# Patient Record
Sex: Female | Born: 1955 | Race: White | Hispanic: No | Marital: Married | Smoking: Never smoker
Health system: Southern US, Community
[De-identification: ages and names within clinical notes are randomized; demographics above are authoritative.]

## PROBLEM LIST (undated history)

## (undated) ENCOUNTER — Emergency Department: Admission: EM | Disposition: A | Discharge status: Home / Self Care | Payer: Self-pay | Source: Home / Self Care

## (undated) DIAGNOSIS — J45909 Unspecified asthma, uncomplicated: Secondary | ICD-10-CM

## (undated) DIAGNOSIS — K5090 Crohn's disease, unspecified, without complications: Secondary | ICD-10-CM

## (undated) DIAGNOSIS — G4700 Insomnia, unspecified: Secondary | ICD-10-CM

## (undated) DIAGNOSIS — R0602 Shortness of breath: Secondary | ICD-10-CM

## (undated) DIAGNOSIS — I341 Nonrheumatic mitral (valve) prolapse: Secondary | ICD-10-CM

## (undated) DIAGNOSIS — D649 Anemia, unspecified: Secondary | ICD-10-CM

## (undated) DIAGNOSIS — T8859XA Other complications of anesthesia, initial encounter: Secondary | ICD-10-CM

## (undated) DIAGNOSIS — R011 Cardiac murmur, unspecified: Secondary | ICD-10-CM

## (undated) DIAGNOSIS — Z5189 Encounter for other specified aftercare: Secondary | ICD-10-CM

## (undated) DIAGNOSIS — M1990 Unspecified osteoarthritis, unspecified site: Secondary | ICD-10-CM

## (undated) DIAGNOSIS — I499 Cardiac arrhythmia, unspecified: Secondary | ICD-10-CM

## (undated) DIAGNOSIS — Q625 Duplication of ureter: Secondary | ICD-10-CM

## (undated) DIAGNOSIS — F419 Anxiety disorder, unspecified: Secondary | ICD-10-CM

## (undated) DIAGNOSIS — H539 Unspecified visual disturbance: Secondary | ICD-10-CM

## (undated) DIAGNOSIS — N12 Tubulo-interstitial nephritis, not specified as acute or chronic: Secondary | ICD-10-CM

## (undated) DIAGNOSIS — J189 Pneumonia, unspecified organism: Secondary | ICD-10-CM

## (undated) DIAGNOSIS — E041 Nontoxic single thyroid nodule: Secondary | ICD-10-CM

## (undated) DIAGNOSIS — I10 Essential (primary) hypertension: Secondary | ICD-10-CM

## (undated) DIAGNOSIS — N200 Calculus of kidney: Secondary | ICD-10-CM

## (undated) DIAGNOSIS — R569 Unspecified convulsions: Secondary | ICD-10-CM

## (undated) DIAGNOSIS — Z9889 Other specified postprocedural states: Secondary | ICD-10-CM

## (undated) DIAGNOSIS — Z96659 Presence of unspecified artificial knee joint: Secondary | ICD-10-CM

## (undated) DIAGNOSIS — F32A Depression, unspecified: Secondary | ICD-10-CM

## (undated) DIAGNOSIS — J449 Chronic obstructive pulmonary disease, unspecified: Secondary | ICD-10-CM

## (undated) HISTORY — DX: Crohn's disease, unspecified, without complications: K50.90

## (undated) HISTORY — DX: Other specified postprocedural states: Z98.890

## (undated) HISTORY — DX: Unspecified osteoarthritis, unspecified site: M19.90

## (undated) HISTORY — DX: Presence of unspecified artificial knee joint: Z96.659

## (undated) HISTORY — DX: Chronic obstructive pulmonary disease, unspecified: J44.9

## (undated) HISTORY — DX: Duplication of ureter: Q62.5

## (undated) HISTORY — PX: APPENDECTOMY (OPEN): SHX54

## (undated) HISTORY — PX: COLONOSCOPY W/ BIOPSIES: SHX1374

## (undated) HISTORY — DX: Tubulo-interstitial nephritis, not specified as acute or chronic: N12

## (undated) HISTORY — PX: KNEE ARTHROPLASTY: SHX992

## (undated) HISTORY — PX: GYNECOLOGIC CRYOSURGERY: SHX857

## (undated) HISTORY — DX: Essential (primary) hypertension: I10

## (undated) HISTORY — DX: Nontoxic single thyroid nodule: E04.1

## (undated) NOTE — Progress Notes (Signed)
Formatting of this note might be different from the original.  This patient's chart has been reviewed by a [facility] Specialist.    Attempted to contact patient in order to discuss appointments and health maintenance screenings due for the upcoming year.     Left message with [facility] Specialist's contact information. and [employer] message with health maintenance recommendations and [facility] Specialist's contact information..    Additional Comments:   Electronically signed by Olivia Mackie at 02/14/2023  8:40 AM EDT

---

## 1995-05-30 ENCOUNTER — Ambulatory Visit: Admission: RE | Admit: 1995-05-30 | Payer: Self-pay | Source: Ambulatory Visit | Admitting: Gastroenterology

## 1996-02-22 ENCOUNTER — Emergency Department: Admission: RE | Admit: 1996-02-22 | Payer: Self-pay | Source: Emergency Department | Admitting: Emergency Medicine

## 1996-02-29 ENCOUNTER — Emergency Department: Admit: 1996-02-29 | Payer: Self-pay | Source: Emergency Department | Admitting: Emergency Medicine

## 1996-10-29 ENCOUNTER — Ambulatory Visit: Admission: RE | Admit: 1996-10-29 | Payer: Self-pay | Source: Ambulatory Visit | Admitting: Gastroenterology

## 1997-04-15 ENCOUNTER — Ambulatory Visit
Admit: 1997-04-15 | Disposition: A | Discharge status: Home / Self Care | Payer: Self-pay | Source: Ambulatory Visit | Admitting: Gastroenterology

## 1997-10-11 ENCOUNTER — Emergency Department: Admit: 1997-10-11 | Payer: Self-pay | Source: Emergency Department | Admitting: Emergency Medicine

## 1998-01-26 ENCOUNTER — Ambulatory Visit
Admit: 1998-01-26 | Disposition: A | Discharge status: Home / Self Care | Payer: Self-pay | Source: Ambulatory Visit | Admitting: Urology

## 1998-01-27 ENCOUNTER — Inpatient Hospital Stay
Admission: AD | Admit: 1998-01-27 | Disposition: A | Discharge status: Home / Self Care | Payer: Self-pay | Source: Ambulatory Visit | Admitting: Urology

## 1998-08-10 ENCOUNTER — Ambulatory Visit: Admission: RE | Admit: 1998-08-10 | Payer: Self-pay | Source: Ambulatory Visit | Admitting: Gastroenterology

## 1999-09-06 ENCOUNTER — Emergency Department: Admit: 1999-09-06 | Payer: Self-pay | Source: Emergency Department | Admitting: Emergency Medicine

## 2001-01-07 ENCOUNTER — Emergency Department: Admit: 2001-01-07 | Payer: Self-pay | Source: Emergency Department

## 2002-04-25 ENCOUNTER — Emergency Department: Admit: 2002-04-25 | Payer: Self-pay | Source: Emergency Department

## 2002-06-11 ENCOUNTER — Emergency Department: Admit: 2002-06-11 | Payer: Self-pay | Source: Emergency Department | Admitting: Pediatric Emergency Medicine

## 2002-06-12 ENCOUNTER — Ambulatory Visit
Admit: 2002-06-12 | Disposition: A | Discharge status: Home / Self Care | Payer: Self-pay | Source: Ambulatory Visit | Admitting: Gastroenterology

## 2002-12-11 ENCOUNTER — Ambulatory Visit: Admission: RE | Admit: 2002-12-11 | Payer: Self-pay | Source: Ambulatory Visit | Admitting: Gastroenterology

## 2003-09-13 ENCOUNTER — Inpatient Hospital Stay
Admission: EM | Admit: 2003-09-13 | Disposition: A | Discharge status: Home / Self Care | Payer: Self-pay | Source: Emergency Department | Admitting: Gastroenterology

## 2003-11-08 ENCOUNTER — Inpatient Hospital Stay
Admission: EM | Admit: 2003-11-08 | Disposition: A | Discharge status: Home / Self Care | Payer: Self-pay | Source: Emergency Department | Admitting: Internal Medicine

## 2004-06-23 ENCOUNTER — Ambulatory Visit
Admit: 2004-06-23 | Disposition: A | Discharge status: Home / Self Care | Payer: Self-pay | Source: Ambulatory Visit | Admitting: Gastroenterology

## 2004-10-04 ENCOUNTER — Ambulatory Visit: Admission: RE | Admit: 2004-10-04 | Payer: Self-pay | Source: Ambulatory Visit | Admitting: Gastroenterology

## 2005-01-09 ENCOUNTER — Ambulatory Visit
Admission: RE | Admit: 2005-01-09 | Disposition: A | Discharge status: Home / Self Care | Payer: Self-pay | Source: Ambulatory Visit | Admitting: Surgery

## 2005-10-11 ENCOUNTER — Emergency Department: Admit: 2005-10-11 | Payer: Self-pay | Source: Emergency Department | Admitting: Emergency Medicine

## 2005-10-11 LAB — BASIC METABOLIC PANEL
BUN: 7 mg/dL — ABNORMAL LOW (ref 8–20)
CO2: 25 mEq/L (ref 21–30)
Calcium: 9.9 mg/dL (ref 8.6–10.2)
Chloride: 107 mEq/L (ref 98–107)
Creatinine: 0.6 mg/dL (ref 0.6–1.5)
Glucose: 93 mg/dL (ref 70–100)
Potassium: 3.8 mEq/L (ref 3.6–5.0)
Sodium: 142 mEq/L (ref 136–146)

## 2005-10-11 LAB — HEPATIC FUNCTION PANEL
ALT: 23 U/L (ref 3–36)
AST (SGOT): 34 U/L (ref 10–41)
Albumin/Globulin Ratio: 1.1 (ref 1.1–1.8)
Albumin: 4.1 g/dL (ref 3.4–4.9)
Alkaline Phosphatase: 83 U/L (ref 43–112)
Bilirubin Direct: 0.3 mg/dL (ref 0.0–0.3)
Bilirubin Indirect: 0 mg/dL — AB (ref 0.1–0.9)
Bilirubin, Total: 0.4 mg/dL (ref 0.1–1.0)
Globulin: 3.6 g/dL (ref 2.0–3.7)
Protein, Total: 7.7 g/dL (ref 6.0–8.0)

## 2005-10-11 LAB — URINALYSIS WITH MICROSCOPIC
Bilirubin, UA: NEGATIVE
Blood, UA: NEGATIVE
Glucose, UA: NEGATIVE
Ketones UA: NEGATIVE
Leukocyte Esterase, UA: NEGATIVE
Nitrite, UA: NEGATIVE
Protein, UR: NEGATIVE
Specific Gravity UA POCT: 1.013 (ref 1.001–1.035)
Urine pH: 8.5 (ref 5.0–8.0)
Urobilinogen, UA: 0.2

## 2005-10-11 LAB — CBC WITH AUTO DIFFERENTIAL CERNER
Basophils Absolute: 0 /mm3 (ref 0.0–0.2)
Basophils: 1 % (ref 0–2)
Eosinophils Absolute: 0.1 /mm3 (ref 0.0–0.7)
Eosinophils: 2 % (ref 0–5)
Granulocytes Absolute: 5.3 /mm3 (ref 1.8–8.1)
Hematocrit: 42.4 % (ref 37.0–47.0)
Hgb: 14.6 G/DL (ref 12.0–16.0)
Lymphocytes Absolute: 3.3 /mm3 (ref 0.5–4.4)
Lymphocytes: 35 % (ref 15–41)
MCH: 30.5 PG (ref 28.0–32.0)
MCHC: 34.4 G/DL (ref 32.0–36.0)
MCV: 88.7 FL (ref 80.0–100.0)
MPV: 7.2 FL — ABNORMAL LOW (ref 7.4–10.4)
Monocytes Absolute: 0.6 /mm3 (ref 0.0–1.2)
Monocytes: 6 % (ref 0–11)
Neutrophils %: 57 % (ref 52–75)
Platelets: 362 /mm3 (ref 140–400)
RBC: 4.77 /mm3 (ref 4.20–5.40)
RDW: 14.1 % (ref 11.5–15.0)
WBC: 9.3 /mm3 (ref 3.5–10.8)

## 2005-10-11 LAB — GFR

## 2005-10-11 LAB — AMYLASE: Amylase: 44 U/L (ref 5–90)

## 2005-10-11 LAB — LIPASE: Lipase: 88 U/L (ref 32–219)

## 2005-10-26 ENCOUNTER — Ambulatory Visit: Admission: RE | Admit: 2005-10-26 | Payer: Self-pay | Source: Ambulatory Visit | Admitting: Gastroenterology

## 2008-03-04 ENCOUNTER — Emergency Department: Admit: 2008-03-04 | Payer: Self-pay | Source: Emergency Department | Admitting: Emergency Medical Services

## 2008-03-04 LAB — COMPREHENSIVE METABOLIC PANEL
ALT: 24 U/L (ref 3–36)
AST (SGOT): 30 U/L (ref 10–41)
Albumin/Globulin Ratio: 1 — ABNORMAL LOW (ref 1.1–1.8)
Albumin: 3.9 g/dL (ref 3.4–4.9)
Alkaline Phosphatase: 71 U/L (ref 43–112)
BUN: 6 mg/dL — ABNORMAL LOW (ref 8–20)
Bilirubin, Total: 0.4 mg/dL (ref 0.1–1.0)
CO2: 24 mEq/L (ref 21–30)
Calcium: 9.6 mg/dL (ref 8.6–10.2)
Chloride: 109 mEq/L — ABNORMAL HIGH (ref 98–107)
Creatinine: 0.7 mg/dL (ref 0.6–1.5)
Globulin: 3.8 g/dL — ABNORMAL HIGH (ref 2.0–3.7)
Glucose: 107 mg/dL — ABNORMAL HIGH (ref 70–100)
Potassium: 3.9 mEq/L (ref 3.6–5.0)
Protein, Total: 7.7 g/dL (ref 6.0–8.0)
Sodium: 142 mEq/L (ref 136–146)

## 2008-03-04 LAB — URINALYSIS WITH MICROSCOPIC
Bilirubin, UA: NEGATIVE
Glucose, UA: NEGATIVE
Ketones UA: NEGATIVE
Nitrite, UA: NEGATIVE
Protein, UR: NEGATIVE
RBC, UA: 26 /HPF — ABNORMAL HIGH (ref 0–3)
Specific Gravity UA POCT: 1.006 (ref 1.001–1.035)
Urine pH: 7.5 (ref 5.0–8.0)
Urobilinogen, UA: NORMAL mg/dL
WBC, UA: 8 /HPF — ABNORMAL HIGH (ref 0–5)

## 2008-03-04 LAB — GFR

## 2008-03-04 LAB — CBC AND DIFFERENTIAL
Basophils Absolute: 0 /mm3 (ref 0.0–0.2)
Basophils: 0 % (ref 0–2)
Eosinophils Absolute: 0.1 /mm3 (ref 0.0–0.7)
Eosinophils: 2 % (ref 0–5)
Granulocytes Absolute: 5.2 /mm3 (ref 1.8–8.1)
Hematocrit: 41.2 % (ref 37.0–47.0)
Hgb: 14 G/DL (ref 12.0–16.0)
Immature Granulocytes Absolute: 0
Immature Granulocytes: 0 %
Lymphocytes Absolute: 1.8 /mm3 (ref 0.5–4.4)
Lymphocytes: 24 % (ref 15–41)
MCH: 29.5 PG (ref 28.0–32.0)
MCHC: 34 G/DL (ref 32.0–36.0)
MCV: 86.7 FL (ref 80.0–100.0)
MPV: 8.8 FL — ABNORMAL LOW (ref 9.4–12.3)
Monocytes Absolute: 0.3 /mm3 (ref 0.0–1.2)
Monocytes: 4 % (ref 0–11)
Neutrophils %: 69 % (ref 52–75)
Platelets: 247 /mm3 (ref 140–400)
RBC: 4.75 /mm3 (ref 4.20–5.40)
RDW: 13.4 % (ref 11.5–15.0)
WBC: 7.54 /mm3 (ref 3.50–10.80)

## 2008-03-04 LAB — LIPASE: Lipase: 78 U/L (ref 32–219)

## 2008-06-03 ENCOUNTER — Emergency Department: Admit: 2008-06-03 | Payer: Self-pay | Source: Emergency Department | Admitting: Emergency Medical Services

## 2008-06-03 LAB — COMPREHENSIVE METABOLIC PANEL
ALT: 11 U/L (ref 3–36)
AST (SGOT): 29 U/L (ref 10–41)
Albumin/Globulin Ratio: 1.1 (ref 1.1–1.8)
Albumin: 3.9 g/dL (ref 3.4–4.9)
Alkaline Phosphatase: 73 U/L (ref 43–112)
BUN: 9 mg/dL (ref 8–20)
Bilirubin, Total: 0.3 mg/dL (ref 0.1–1.0)
CO2: 26 mEq/L (ref 21–30)
Calcium: 9.7 mg/dL (ref 8.6–10.2)
Chloride: 109 mEq/L — ABNORMAL HIGH (ref 98–107)
Creatinine: 0.7 mg/dL (ref 0.6–1.5)
Globulin: 3.5 g/dL (ref 2.0–3.7)
Glucose: 82 mg/dL (ref 70–100)
Potassium: 4 mEq/L (ref 3.6–5.0)
Protein, Total: 7.4 g/dL (ref 6.0–8.0)
Sodium: 141 mEq/L (ref 136–146)

## 2008-06-03 LAB — CBC AND DIFFERENTIAL
Basophils Absolute: 0 /mm3 (ref 0.0–0.2)
Basophils: 0 % (ref 0–2)
Eosinophils Absolute: 0.2 /mm3 (ref 0.0–0.7)
Eosinophils: 2 % (ref 0–5)
Granulocytes Absolute: 4.8 /mm3 (ref 1.8–8.1)
Hematocrit: 42.3 % (ref 37.0–47.0)
Hgb: 14.2 G/DL (ref 12.0–16.0)
Immature Granulocytes Absolute: 0
Immature Granulocytes: 0 %
Lymphocytes Absolute: 2.6 /mm3 (ref 0.5–4.4)
Lymphocytes: 33 % (ref 15–41)
MCH: 29 PG (ref 28.0–32.0)
MCHC: 33.6 G/DL (ref 32.0–36.0)
MCV: 86.5 FL (ref 80.0–100.0)
MPV: 9.5 FL (ref 9.4–12.3)
Monocytes Absolute: 0.4 /mm3 (ref 0.0–1.2)
Monocytes: 5 % (ref 0–11)
Neutrophils %: 60 % (ref 52–75)
Platelets: 295 /mm3 (ref 140–400)
RBC: 4.89 /mm3 (ref 4.20–5.40)
RDW: 13.1 % (ref 11.5–15.0)
WBC: 8.08 /mm3 (ref 3.50–10.80)

## 2008-06-03 LAB — LIPASE: Lipase: 112 U/L (ref 32–219)

## 2008-06-03 LAB — GFR

## 2008-06-30 ENCOUNTER — Ambulatory Visit
Admit: 2008-06-30 | Disposition: A | Discharge status: Home / Self Care | Payer: Self-pay | Source: Ambulatory Visit | Admitting: Urology

## 2008-06-30 LAB — BASIC METABOLIC PANEL
BUN: 7 MG/DL (ref 7–21)
CO2: 31 MEQ/L (ref 22–31)
Calcium: 9.9 MG/DL (ref 8.6–10.2)
Chloride: 105 MEQ/L (ref 98–107)
Creatinine: 0.7 MG/DL (ref 0.5–1.4)
Glucose: 85 MG/DL (ref 65–110)
Potassium: 4.2 MEQ/L (ref 3.6–5.0)
Sodium: 140 MEQ/L (ref 136–143)

## 2008-06-30 LAB — GFR

## 2008-06-30 LAB — URINALYSIS
Bilirubin, UA: NEGATIVE
Glucose, UA: NEGATIVE
Ketones UA: NEGATIVE
Nitrite, UA: NEGATIVE
Specific Gravity UA POCT: <=1.005 (ref <=1.030)
Urine pH: 6 (ref 5.0–8.0)
Urobilinogen, UA: 0.2

## 2008-06-30 LAB — CBC
Hematocrit: 43.1 % (ref 37.0–47.0)
Hgb: 14.2 G/DL (ref 12.0–16.0)
MCH: 28.9 PG (ref 28.0–32.0)
MCHC: 32.9 G/DL (ref 32.0–36.0)
MCV: 87.8 FL (ref 80.0–100.0)
MPV: 9.1 FL — ABNORMAL LOW (ref 9.4–12.3)
Platelets: 291 /mm3 (ref 140–400)
RBC: 4.91 /mm3 (ref 4.20–5.40)
RDW: 13.2 % (ref 11.5–15.0)
WBC: 7.25 /mm3 (ref 3.50–10.80)

## 2008-06-30 LAB — URINE MICROSCOPIC

## 2008-06-30 LAB — PT/INR
PT INR: 1.1 {INR} (ref 0.9–1.1)
PT: 12.6 s (ref 10.8–13.3)

## 2008-06-30 LAB — APTT: PTT: 24 s (ref 21–32)

## 2008-07-03 ENCOUNTER — Ambulatory Visit: Admission: RE | Admit: 2008-07-03 | Payer: Self-pay | Source: Ambulatory Visit | Admitting: Urology

## 2008-08-18 ENCOUNTER — Ambulatory Visit
Admit: 2008-08-18 | Disposition: A | Discharge status: Home / Self Care | Payer: Self-pay | Source: Ambulatory Visit | Admitting: Urology

## 2008-08-18 LAB — URINE MICROSCOPIC

## 2008-08-18 LAB — URINALYSIS: Specific Gravity UA POCT: 1.015 (ref <=1.030)

## 2008-08-21 ENCOUNTER — Ambulatory Visit: Admission: RE | Admit: 2008-08-21 | Payer: Self-pay | Source: Ambulatory Visit | Admitting: Urology

## 2011-02-12 ENCOUNTER — Emergency Department
Admit: 2011-02-12 | Disposition: A | Discharge status: Home / Self Care | Payer: Self-pay | Source: Emergency Department | Admitting: Emergency Medicine

## 2011-02-12 LAB — CBC AND DIFFERENTIAL
Basophils Absolute Automated: 0.04 10*3/uL (ref 0.00–0.20)
Basophils Automated: 0 % (ref 0–2)
Eosinophils Absolute Automated: 0.24 10*3/uL (ref 0.00–0.70)
Eosinophils Automated: 3 % (ref 0–5)
Hematocrit: 41.5 % (ref 37.0–47.0)
Hgb: 14.1 g/dL (ref 12.0–16.0)
Immature Granulocytes Absolute: 0.01 10*3/uL
Immature Granulocytes: 0 % (ref 0–1)
Lymphocytes Absolute Automated: 3.58 10*3/uL (ref 0.50–4.40)
Lymphocytes Automated: 41 % (ref 15–41)
MCH: 29.5 pg (ref 28.0–32.0)
MCHC: 34 g/dL (ref 32.0–36.0)
MCV: 86.8 fL (ref 80.0–100.0)
MPV: 9 fL — ABNORMAL LOW (ref 9.4–12.3)
Monocytes Absolute Automated: 0.58 10*3/uL (ref 0.00–1.20)
Monocytes: 7 % (ref 0–11)
Neutrophils Absolute: 4.29 10*3/uL (ref 1.80–8.10)
Neutrophils: 49 % — ABNORMAL LOW (ref 52–75)
Nucleated RBC: 0 /100 WBC
Platelets: 283 10*3/uL (ref 140–400)
RBC: 4.78 10*6/uL (ref 4.20–5.40)
RDW: 13 % (ref 12–15)
WBC: 8.74 10*3/uL (ref 3.50–10.80)

## 2011-02-12 LAB — COMPREHENSIVE METABOLIC PANEL
ALT: 24 U/L (ref 3–36)
AST (SGOT): 47 U/L — ABNORMAL HIGH (ref 10–41)
Albumin/Globulin Ratio: 1 — ABNORMAL LOW (ref 1.1–1.8)
Albumin: 4 g/dL (ref 3.4–4.9)
Alkaline Phosphatase: 65 U/L (ref 43–112)
BUN: 9 mg/dL (ref 8–20)
Bilirubin, Total: 0.8 mg/dL (ref 0.1–1.0)
CO2: 19 mEq/L — ABNORMAL LOW (ref 21–30)
Calcium: 9.6 mg/dL (ref 8.6–10.2)
Chloride: 110 mEq/L — ABNORMAL HIGH (ref 98–107)
Creatinine: 0.6 mg/dL (ref 0.6–1.5)
Globulin: 3.9 g/dL — ABNORMAL HIGH (ref 2.0–3.7)
Glucose: 92 mg/dL (ref 70–100)
Potassium: 4.5 mEq/L (ref 3.6–5.0)
Protein, Total: 7.9 g/dL (ref 6.0–8.0)
Sodium: 142 mEq/L (ref 136–146)

## 2011-02-12 LAB — URINALYSIS, REFLEX TO MICROSCOPIC EXAM IF INDICATED
Bilirubin, UA: NEGATIVE
Blood, UA: NEGATIVE
Glucose, UA: NEGATIVE
Leukocyte Esterase, UA: NEGATIVE
Nitrite, UA: NEGATIVE
Protein, UR: NEGATIVE
Specific Gravity UA POCT: 1.005 (ref 1.001–1.035)
Urine pH: 7 (ref 5.0–8.0)
Urobilinogen, UA: NORMAL mg/dL

## 2011-02-12 LAB — LIPASE: Lipase: 159 U/L (ref 32–219)

## 2011-02-12 LAB — GFR: EGFR: >60

## 2011-02-13 ENCOUNTER — Emergency Department
Admit: 2011-02-13 | Disposition: A | Discharge status: Home / Self Care | Payer: Self-pay | Source: Emergency Department | Admitting: Emergency Medicine

## 2011-02-13 LAB — URINALYSIS POC
POCT Urine Glucose: NEGATIVE mg/dL
POCT Urine Nitrites: NEGATIVE mL
POCT Urine Urobilibogen: 0.2 mg/dL (ref 0.2–2.0)
POCT Urine pH: 5.5 (ref 5.0–8.0)
Protein, UR POCT: NEGATIVE mg/dL

## 2011-02-13 LAB — CBC AND DIFFERENTIAL
Basophils Absolute Automated: 0.05 10*3/uL (ref 0.00–0.20)
Basophils Automated: 1 % (ref 0–2)
Eosinophils Absolute Automated: 0.23 10*3/uL (ref 0.00–0.70)
Eosinophils Automated: 3 % (ref 0–5)
Hematocrit: 43.4 % (ref 37.0–47.0)
Hgb: 14.6 g/dL (ref 12.0–16.0)
Immature Granulocytes Absolute: 0.03 10*3/uL
Immature Granulocytes: 0 % (ref 0–1)
Lymphocytes Absolute Automated: 2.93 10*3/uL (ref 0.50–4.40)
Lymphocytes Automated: 34 % (ref 15–41)
MCH: 29.3 pg (ref 28.0–32.0)
MCHC: 33.6 g/dL (ref 32.0–36.0)
MCV: 87.1 fL (ref 80.0–100.0)
MPV: 10.3 fL (ref 9.4–12.3)
Monocytes Absolute Automated: 0.59 10*3/uL (ref 0.00–1.20)
Monocytes: 7 % (ref 0–11)
Neutrophils Absolute: 4.84 10*3/uL (ref 1.80–8.10)
Neutrophils: 56 % (ref 52–75)
Nucleated RBC: 0 /100 WBC
Platelets: 289 10*3/uL (ref 140–400)
RBC: 4.98 10*6/uL (ref 4.20–5.40)
RDW: 14 % (ref 12–15)
WBC: 8.67 10*3/uL (ref 3.50–10.80)

## 2011-02-13 LAB — COMPREHENSIVE METABOLIC PANEL
ALT: 13 U/L (ref 3–36)
AST (SGOT): 45 U/L — ABNORMAL HIGH (ref 10–41)
Albumin/Globulin Ratio: 1 — ABNORMAL LOW (ref 1.1–1.8)
Albumin: 4 g/dL (ref 3.4–4.9)
Alkaline Phosphatase: 48 U/L (ref 43–112)
BUN: 6 mg/dL — ABNORMAL LOW (ref 8–20)
Bilirubin, Total: 0.9 mg/dL (ref 0.1–1.0)
CO2: 23 mEq/L (ref 21–30)
Calcium: 9.2 mg/dL (ref 8.6–10.2)
Chloride: 106 mEq/L (ref 98–107)
Creatinine: 0.6 mg/dL (ref 0.6–1.5)
Globulin: 4.2 g/dL — ABNORMAL HIGH (ref 2.0–3.7)
Glucose: 143 mg/dL — ABNORMAL HIGH (ref 70–100)
Potassium: 4 mEq/L (ref 3.6–5.0)
Protein, Total: 8.2 g/dL — ABNORMAL HIGH (ref 6.0–8.0)
Sodium: 141 mEq/L (ref 136–146)

## 2011-02-13 LAB — AMYLASE: Amylase: 113 U/L — ABNORMAL HIGH (ref 0–90)

## 2011-02-13 LAB — URINE MICROSCOPIC

## 2011-02-13 LAB — GFR: EGFR: >60

## 2011-02-13 LAB — LIPASE: Lipase: 177 U/L (ref 32–219)

## 2011-04-14 NOTE — Op Note (Signed)
Account Number: [id number]      Document ID: [id number]      Admit Date: 08/21/2008      Procedure Date: 08/21/2008            Patient Location: [facility]      Patient Type: A            SURGEON: Iven Finn MD      ASSISTANT:                  PREOPERATIVE DIAGNOSES:      Left UPJ stone fragments and left lower pole kidney stone, following      previous ESWL.            POSTOPERATIVE DIAGNOSES:      Left UPJ stone fragments and left lower pole kidney stone, following      previous ESWL.            TITLE OF PROCEDURE:      1.  Second stage of a staged ESWL procedure on a very large left kidney      stone that started out about 1.7 cm in size.      2.  Cystoscopy and stent removal.            ESTIMATED BLOOD LOSS:      Minimal.            INTRAVENOUS FLUIDS:      See anesthesia sheet.            DRAINS:      None.            INDICATIONS:      The patient is a 55 year old female presented to me approximately 8 weeks      ago with a left 1.7-cm kidney stone.  After discussing risks and benefits      of the treatment options, she has elected for a staged ESWL.  The first      stage of the shockwave took place approximately 1 month ago.  She was      followed up in the office several days ago and showed some stone fragments      at the UPJ, about 1.4 cm in length and also about a 6-mm stone in the left      lower pole.  After discussing risks and benefits of treatment options, she      has elected for left ESWL.            DESCRIPTION OF PROCEDURE:      She received preoperative Ancef and was brought to the operative suite.      The stone was localized with fluoroscopy at the UPJ.  A total of 2200      shocks were given at the UPJ stone with a maximal power level of 6.  Six      was only used for the last 200 shocks and it was a UPJ stone as well as      some stone going down the proximal ureter.            At 2200 shocks, attention was then focused to the left lower pole stone.  A      total of 800 shocks were given to  this stone with power level between 1 and      4, and there was excellent fragmentation noted of both stone areas.            At the end of  the shocking, the patient was then positioned in a frog-leg      position.  She was prepped.  Cystoscopy was performed which showed no      evidence of any significant bladder pathology.  The stent was grabbed with      a grasper and removed.  The patient's bladder was drained, and she was then      awakened and brought to the recovery room in good condition.                        Electronic Signing Provider      _______________________________     Date/Time Signed: _____________      Iven Finn MD ([id number])            D:  08/21/2008 10:07 AM by Dr. Harlin Rain L. Walker Shadow, MD ([id number])      T:  08/21/2008 11:29 AM by [id number]          [name]: [id number]) (Doc ID: [id number])                  [id number] Donnell Beauchamp MD

## 2011-04-14 NOTE — Op Note (Signed)
Account Number: [id number]      Document ID: [id number]      Admit Date: 07/03/2008      Procedure Date: 07/03/2008            Patient Location: [facility]      Patient Type: A            SURGEON: Iven Finn MD      ASSISTANT:                  PREOPERATIVE DIAGNOSIS:      Left 2 cm ureteropelvic junction stone.            POSTOPERATIVE DIAGNOSIS:      Left 2 cm ureteropelvic junction stone.            TITLE OF PROCEDURE:      1.  Left extracorporeal shockwave lithotripsy.      2.  Cystoscopy and left ureteral stent placement.            DRAINS:      A 4.8 x 24-cm left double-J stent.            SPECIMENS:      None.            ANESTHESIA:      General with LMA.            INDICATIONS:      The patient is a 55 year old female with intermittent left flank pain, a      history of stones.  She is noted to have very light 2 cm left UPJ stone.      After discussing risks and benefits of procedure, she has elected for left      ESWL.            DESCRIPTION OF PROCEDURE:      She received preoperative Ancef, was brought to the main operating room,      underwent general anesthesia.  On a preoperative KUB, we could faintly see      the outline of the left kidney stone.  However, on fluoroscopy, it was      difficult to see.  Therefore, we used ultrasound to localize the stone.  It      was clearly localized with ultrasound.  A total of 2500 shocks were given      in an ungated format with a power level of 1 to 5.  It was difficult to      ascertain how much fragmentation of the stone was done.            Next, the patient was placed in lithotomy position.  Cystoscopy was      performed.  She was noted to have a grade II to III cystocele.  The left      ureteral orifice was localized.  An open-ended 5-French catheter was      placed.  Retrograde ureterogram confirmed the stone in the collecting      system, but it was very difficult to actually see the stone or a filling      defect, hinting that possibly there was significant  fragmentation of the      stone noted.  Over the wire, a 4.8 x 24-cm double-J stent was placed.  It      was noted to be in good position with fluoroscopy and with direct vision      inside the patient's bladder.  The patient's bladder was drained, and she  was awakened and brought to the recovery room in good condition.                        Electronic Signing Provider      _______________________________     Date/Time Signed: _____________      Iven Finn MD ([id number])            D:  07/03/2008 09:58 AM by Dr. Harlin Rain L. Walker Shadow, MD ([id number])      T:  07/03/2008 12:30 PM by BS          (Conf: [id number]) (Doc ID: [id number])                  FA:[id number] Sadie Pickar MD

## 2012-01-31 ENCOUNTER — Ambulatory Visit: Payer: BLUE CROSS/BLUE SHIELD | Admitting: Obstetrics & Gynecology

## 2012-01-31 ENCOUNTER — Encounter: Payer: Self-pay | Admitting: Obstetrics & Gynecology

## 2012-01-31 VITALS — BP 150/68 | Ht 60.75 in | Wt 181.0 lb

## 2012-01-31 DIAGNOSIS — Z0000 Encounter for general adult medical examination without abnormal findings: Secondary | ICD-10-CM

## 2012-01-31 DIAGNOSIS — K5010 Crohn's disease of large intestine without complications: Secondary | ICD-10-CM

## 2012-01-31 DIAGNOSIS — Z126 Encounter for screening for malignant neoplasm of bladder: Secondary | ICD-10-CM

## 2012-01-31 DIAGNOSIS — Z9049 Acquired absence of other specified parts of digestive tract: Secondary | ICD-10-CM | POA: Insufficient documentation

## 2012-01-31 DIAGNOSIS — N814 Uterovaginal prolapse, unspecified: Secondary | ICD-10-CM

## 2012-01-31 DIAGNOSIS — Z1151 Encounter for screening for human papillomavirus (HPV): Secondary | ICD-10-CM

## 2012-01-31 DIAGNOSIS — Z01419 Encounter for gynecological examination (general) (routine) without abnormal findings: Secondary | ICD-10-CM

## 2012-01-31 HISTORY — DX: Crohn's disease of large intestine without complications: K50.10

## 2012-01-31 NOTE — Progress Notes (Signed)
Patient Name: Chelsea Montoya  Attending Physician: Natasha Mead  CC: Prolapse, uterus, anterior prolapse    History of Presenting Illness:   Chelsea Montoya is a 56 y.o. female who presents to the office for gyn annual exam and evaluation of pelvic prolapse. She was last seen in 2010 and reports some worsening of her symptoms with increasing pressure     Past Medical History:     Past Medical History   Diagnosis Date   . Endometriosis, cervix    . Crohn's disease    . Pyelonephritis        Past Surgical History:     Past Surgical History   Procedure Date   . Appendectomy    . Colectomy    . Gynecologic cryosurgery        OB History:     LMP: 08/2005  G6P2  Mammo 2010 Nl  Patient has not gone since        Medications / Herbals / OTC:   Azufidine    Herbals: none    OTC: none    Allergies:     Allergies   Allergen Reactions   . Darvon    . Keflex (Cephalexin)    . Ciprofloxacin Rash         Psychosocial / Family History:       Family History   Problem Relation Age of Onset   . Ovarian cancer Maternal Grandmother    . Breast cancer Paternal Grandmother    . Ovarian cancer Paternal Aunt    . Uterine cancer Cousin            Physical Exam:     Filed Vitals:    01/31/12 1833   BP: 150/68       Neuro: negative  HEENT: PERRLA  Lungs: Heart exam - S1, S2 normal, no murmur, no gallop, rate regular  Cardiac: normal rate, regular rhythm, normal S1, S2, no murmurs, rubs, clicks or gallops  Abdomen: Abdomen soft, non-tender. BS normal. No masses,  No organomegaly, old vertical scar scar  Pelvic: normal external genitalia, vulva, vagina, cervix, uterus and adnexa, exam chaperoned by female assistant, atrophic vaginal changes noted, cystocele noted  , uterine prolapse      Assessment:   Pelvic prolapse - patient is not interested in current therapy and is very reluctant to undergo furher surgery at this time. Will consider pessary  Mammo  Dexa Hi risk for osteoporosis     Plan:   mammo  dexa  F/U prn 61yr      Signed by: Lajoyce Corners

## 2012-02-03 LAB — URINALYSIS WITH MICROSCOPIC
Bilirubin, UA: NEGATIVE
Blood, UA: NEGATIVE
Glucose Qualitative: NEGATIVE
Hyaline Casts, UA: NONE SEEN
Ketones UA: NEGATIVE
NITRITE: NEGATIVE
Protein, UA: NEGATIVE
RBC UA: NONE SEEN (ref 0–3)
Specific Gravity, UA: 1.003 (ref 1.001–1.035)
Squam Epithel, UA: NONE SEEN (ref 0–5)
Urine Bacteria: NONE SEEN
WBC: NONE SEEN (ref 0–5)
pH: 7 (ref 5.0–8.0)

## 2012-02-03 LAB — URINE CULTURE

## 2012-02-03 LAB — SENSITIVITY I

## 2012-02-06 LAB — THINPREP IMAGING PAP WITH REFLEX TO HPV MRNA E6/E7.

## 2012-06-18 NOTE — Op Note (Unsigned)
DATE OF BIRTH:                        1956/05/23      ADMISSION DATE:                     12/11/2002            PATIENT LOCATION:                     [facility]            DATE OF PROCEDURE:                   12/11/2002      SURGEON:                            Rayburn Ma, MD      ASSISTANT(S):                  PREOPERATIVE DIAGNOSIS:  CROHN'S DISEASE.            POSTOPERATIVE DIAGNOSIS:  NORMAL.            PROCEDURE:  COLONOSCOPY.            DESCRIPTION OF PROCEDURE:  With the patient in the left lateral decubitus      position, after intravenous Versed and fentanyl, the endoscope was passed      easily from the rectum to the terminal ileum.  Random biopsies of a normal      appearing terminal ileum were obtained.  Random biopsies of the right colon      and the cecum and ascending colon were obtained.  There was no gross      evidence of colonic mucosa or mural pathology, specifically no evidence of      colitis, polyps, tumor, diverticular disease or vascular abnormality.  The      procedure was terminated and the patient returned to recovery in good      condition.                                    ___________________________________     Date Signed: __________      Rayburn Ma, MD  ([id number])            D: 12/11/2002 by Rayburn Ma, MD      T: 12/12/2002 by [id number] ([id number]) (Z:[id number])      cc:  Rayburn Ma, MD

## 2012-06-28 NOTE — Op Note (Unsigned)
DATE OF BIRTH:                        [birth year]      ADMISSION DATE:                     10/04/2004            PATIENT LOCATION:                     [facility]            DATE OF PROCEDURE:                   10/04/2004      SURGEON:                            Halina Andreas, MD      ASSISTANT(S):                  PREOPERATIVE DIAGNOSIS      1.   GASTROESOPHAGEAL REFLUX DISEASE.      2.   ABNORMAL UPPER GASTROINTESTINAL DEMONSTRATING ANTRAL THICKENING.            POSTOPERATIVE DIAGNOSIS      1.   MILD ANTRAL ERYTHEMA SUGGESTIVE OF GASTRITIS, STATUS POST BIOPSY.      2.   OTHERWISE NORMAL APPEARING MUCOSA TO THE SECOND PORTION OF THE      DUODENUM.      3.   STATUS POST RANDOM BIOPSY OF THE SECOND PORTION OF THE DUODENUM.            PROCEDURE:  ESOPHAGOGASTRODUODENOSCOPY.            CONSENT:  The procedure was explained in detail to the patient, including      the risks (including but not limited to cardiorespiratory complications,      bleeding, perforation, infection, missed lesions), benefits, and      alternatives (radiographic, nothing).  Informed consent was obtained.            MEDICATIONS:   Deep sedation administered by anesthesia using propofol.            REFERRING PHYSICIAN:  Spero Geralds, M.D.            DESCRIPTION OF PROCEDURE:   The patient was placed in the left lateral      decubitus position and sedated by anesthesia as above.  An Olympus 160      upper endoscope was passed through the mouth to the second portion of the      duodenum under direct visualization without difficulty.  The proximal, mid,      and distal esophagus appeared normal.  In the stomach, mild antral erythema      was visualized, suggestive of gastritis.  A biopsy was obtained of the      gastric antrum.  The remainder of the gastric mucosa appeared normal,      including on retroflexion.  The duodenal bulb and second portion of the      duodenum appeared normal.  Random biopsies were obtained in the second      portion of the  duodenum.  The endoscope was then withdrawn.  The patient      tolerated the procedure well and was transferred to the recovery room in      good condition.  IMPRESSION      1.   MILD ANTRAL ERYTHEMA SUGGESTIVE OF GASTRITIS, STATUS POST BIOPSY.      2.   OTHERWISE NORMAL APPEARING MUCOSA TO THE SECOND PORTION OF THE      DUODENUM.      3.   STATUS POST RANDOM BIOPSY OF THE SECOND PORTION OF THE DUODENUM.            RECOMMENDATIONS      1.   The patient is instructed to follow up in the office in two to four      weeks.      2.   Call office in one week for pathology results.      3.   Continue current management.      4.   Follow up with the referring physician.                                                ___________________________________          Date Signed: __________      Halina Andreas, MD  ([id number])            D: 10/04/2004 by Halina Andreas, MD      T: 10/05/2004 by [id number] ([id number]) ([id number])      cc:  Halina Andreas, MD          Dante Gang, MD

## 2012-06-28 NOTE — Op Note (Signed)
DATE OF BIRTH:                        [birth year]      ADMISSION DATE:                     01/09/2005            PATIENT LOCATION:                     [id number]            DATE OF PROCEDURE:                   01/09/2005      SURGEON:                            Arnette Norris, MD      ASSISTANT(S):                  FIRST ASSISTANT:  Kristen Rupell, PA            PREOPERATIVE DIAGNOSES      1.   HISTORY OF CROHN DISEASE.      2.   PROXIMAL MUCOUS DILATATION OF THE APPENDIX ON CT, RULE OUT NEOPLASM.            POSTOPERATIVE DIAGNOSES      1.   HISTORY OF CROHN DISEASE.      2.   PROXIMAL MUCOUS DILATATION OF THE APPENDIX ON CT, RULE OUT NEOPLASM.            PROCEDURE:  LAPAROSCOPIC APPENDECTOMY.            COMPLICATIONS:  None.            ESTIMATED BLOOD LOSS:  Minimal.            ANESTHESIA:   General.            INDICATIONS:  The patient is a 58 year old woman who in the past underwent      a laparotomy and bowel resection by me for Crohn disease.  Since that time,      she has required no other abdominal operations.  Over the past year and a      half, she had CT scans of the abdomen done, and these showed proximal      dilatation of the appendix with contained mucus.  The differential was      varied and included the possible chronic appendicitis versus      mucus-producing neoplasm.  The findings were stable, though with persistent      intermittent right lower quadrant abdominal pain, she was sent for surgical      referral.  Additionally, the patient has undergone endoscopy, and these      have failed to reveal any visible tumor.            The rationale for surgery was discussed with the patient.  In addition, she      had undergone a followup CT recently which showed no change.  She      understood the potential risks, complications, versus benefit of operation      and did agree to proceed as discussed.            On the day of surgery, I met with the patient and her husband in the  preoperative area.   Her final questions were answered.  Boarding pass and      informed consent were complete.  She was covered with antibiotics,      subcutaneous heparin, and taken to the operating room.            DESCRIPTION OF PROCEDURE:  The patient was placed on the operating table in      the supine position, and after general anesthesia was induced, the Foley      catheter was inserted, and her abdomen was prepped and draped in the usual      sterile fashion.  The laparoscope was introduced transabdominally through a      small supraumbilical cut-down by direct vision.  The peritoneum itself was      opened by digital manipulation and by verifying an intra-abdominal position      by feel.            Once the laparoscope was placed in the abdomen, CO2 was entered.  This      allowed visualization of the majority of the abdomen.  The gallbladder was      visualized and contained chronic scar but otherwise appeared normal.  The      liver appeared normal.  The small bowel that could be visualized showed no      evidence of inflammation.  There was small bowel along the lower portion of      the abdominal wound adhesed to it, but again, there were no other      abnormalities, and this bowel did not look obstructed.            The right colon was identified, and the tenia was easily seen.  The      terminal ileum entering the colon was also visualized and appeared normal.      Two remaining trocars, 5 mm, were then placed in their standard positions.      By gentle manipulation, the terminal ileum was rotated medially, and this      exposed the appendix itself.  The gross findings matched those of the CT,      where there was bulbous dilatation of the proximal appendix up to and into      the appendiceal base.  The distal appendix remarkably was very elongated      and thinned and almost chronically scarred.  No other abnormalities in this      region were seen.            The appendix was then elevated, and the base of the  appendix was isolated      using blunt dissection.  This was viewed from multiple laparoscopic      positions to verify the positioning, and once verified, a TA 45-mm      endoscopic stapler was placed across the base of the appendix on the cecal      side and fired.  When released, there was only one area of minimal      bleeding, and this was controlled with cautery.  The staple line on the      appendix side was then used for retraction.  The appendix was elevated, and      using the Harmonic scalpel, the mesentery was slowly dissected and the      vessels coagulated.  As the more distal appendix became evident, filmy      adhesions to a more proximal portion of ileum were identified,  and these      were bluntly dissected.            A 45-mm vascular load stapler was then placed across the remaining      mesentery of the appendix distally and fired.  It was held for a few      minutes to assure hemostasis, and when released, there was no bleeding and      the appendix was completely mobilized and free.  The entire appendix was      then placed in an Endopouch and would be removed through the supraumbilical      port without spillage.  After the appendix was out and sent to pathology,      the right lower quadrant was inspected, irrigated, and appeared dry.      Satisfied, the excess gas was suctioned from the abdomen.  All trocars and      cannulae were removed in sequence and closure begun.            The fascia at the supraumbilical level was closed with interrupted      figure-of-8 0 Vicryl.  Subcutaneous tissue at all sites injected with 0.25%      Marcaine with epinephrine prior to skin closure with Monocryl.  Wounds were      cleaned and dressed in the usual way.  The patient tolerated the procedure      well, had no intraoperative complications, and was transferred to the      recovery room in stable condition with all sponge, needle, and instrument      counts reported as correct by the nursing staff at  the conclusion of the      case.                                                Electronic Signing MD: Arnette Norris, MD  ([id number])            D: 01/09/2005 by Arnette Norris, MD      T: 01/09/2005 by [id number] ([id number]) ([id number])      cc:  Graciella Belton, MD          Arnette Norris, MD

## 2012-06-28 NOTE — Discharge Summary (Unsigned)
DATE OF BIRTH:                        20-Sep-1955            ADMISSION DATE:                     09/13/2003      DISCHARGE DATE:                     09/18/2003            ATTENDING PHYSICIAN:                  Halina Andreas, MD            HISTORY OF PRESENT ILLNESS:  The patient is a 57 year old female with past      medical history of Crohn colitis, osteoarthritis, asthma, status post bowel      resection, status post left knee replacement, and status post      tonsillectomy.  The patient was admitted on 09/13/2003, with abdominal      pain, bowel distension, and nausea and vomiting.            HOSPITAL COURSE:  The patient underwent evaluation with a CT scan on      09/13/2003, demonstrating a dilated appendix and a complex left adrenal      cyst.  The patient was _____ with a clear liquid diet and gynecology with      Dr. Tenny Craw, consulted.  The patient subsequently underwent evaluation with an      ultrasound on 09/14/2003, which was normal and a pelvic ultrasound on      09/14/2003, which demonstrated anteverted uterus with a single intramural      fibroid, hypoechoic structure within the left ovary, and a tubular      fluid-filled structure adjacent to the left ovary.  The patient was      evaluated by gynecology and recommendations were for followup in 3 months      for further imaging with a pelvic ultrasound.  The patient clinically      improved on a clear liquid diet and a trial of Bentyl for a possible      functional component.  Further evaluation with an upper GI and small-bowel      follow-through on 09/17/2003, demonstrated minimal GERD, mild fold      thickening of the gastric antrum;  otherwise, small bowel was normal.            MEDICATIONS ON DISCHARGE:  The patient was discharged on 09/18/2003, on a      medical regimen that includes _____ 500 mg 2 tablets p.o. t.i.d., Protonix,      Augmentin, and Flagyl.            FOLLOWUP:  With Dr. Arneta Cliche in 2 weeks.                                                 ___________________________________          Date Signed: __________      Halina Andreas, MD  ([id number])            D: 12/24/2003 by Halina Andreas, MD      T: 12/25/2003 by [id number] ([id number]) (  N: [id number])      cc:  Halina Andreas, MD

## 2012-06-28 NOTE — Op Note (Unsigned)
DATE OF BIRTH:                        07/04/1955      ADMISSION DATE:                     10/04/2004            PATIENT LOCATION:                     END END 18            DATE OF PROCEDURE:                   10/04/2004      SURGEON:                            Halina Andreas, MD      ASSISTANT(S):                  PREOPERATIVE DIAGNOSIS:  CROHN DISEASE.            POSTOPERATIVE DIAGNOSES      1.   TWO DIMINUTIVE POLYPS AT 40 CENTIMETERS AND 30 CENTIMETERS FROM THE      ANAL VERGE STATUS POST EXCISIONAL BIOPSY.      2.   EXTRINSIC COMPRESSION INVOLVING THE APPENDICEAL ORIFICE CONSISTENT      WITH A QUESTION OF A MUCOCELE OF THE APPENDIX BY CT SCAN.      3.   MEDIUM-SIZED INTERNAL HEMORRHOIDS.      4.   OTHERWISE, NORMAL-APPEARING MUCOSA TO THE TERMINAL ILEUM.      5.   STATUS POST SURVEILLANCE BIOPSIES OF THE TERMINAL ILEUM AND OF THE      COLON EVERY 10 CENTIMETERS AND PLACED IN PATHOLOGY CONTAINERS LABELED RIGHT      COLON, TRANSVERSE COLON, AND LEFT COLON.            PROCEDURE:  COLONOSCOPY.            REFERRING PHYSICIAN:  Dr. Spero Geralds.            DESCRIPTION OF PROCEDURE:  The procedure was explained in detail to the      patient including the risks (including but not limited to cardiorespiratory      complications, bleeding, perforation, infection, and missed lesions),      benefits, and alternatives (flexible sigmoidoscopy, radiographic, and      nothing).  Informed consent was obtained.            MEDICATIONS:  Deep sedation administered by anesthesia using propofol.            The patient was placed in the left lateral decubitus position and sedated      by anesthesia as above.  An Olympus 160 colonoscope was inserted into the      rectum and advanced to the cecum under direct visualization without      difficulty.  The cecum was identified by the appendiceal orifice and the      ileocecal valve.  The terminal ileum was intubated and appeared normal.      Random biopsies were obtained of the terminal  ileum.  The colonoscope was      slowly withdrawn with careful inspection of the mucosa.            In the cecum, extrinsic compression involving the appendiceal orifice  was      visualized with normal-appearing mucosa noted.  This was consistent with      the question of mucocele on CT scan for which the patient has been      instructed to follow up with the surgeon for further evaluation and      management.  Otherwise, normal-appearing mucosa was visualized in the      cecum, ascending colon, and transverse colon.  In the left colon at 40 cm      from the anal verge, a diminutive polyp was visualized and removed with      excisional biopsy.  At 30 cm from the anal verge, a diminutive polyp was      visualized and removed with excisional biopsy.  Medium-sized internal      hemorrhoids were visualized on retroflexion.  The mucosa otherwise appeared      normal.  Random biopsies were obtained from the cecum to the rectum every      10 cm and placed in pathology containers labeled right colon, transverse      colon, and left colon.  The colonoscope was then withdrawn.  The patient      tolerated the procedure well and was transferred to the recovery room in      good condition.            IMPRESSIONS      1.   To diminutive polyps at 40 centimeters and 30 centimeters from the      anal verge status post excisional biopsy.      2.   Extrinsic compression involving the appendiceal orifice consistent      with a question of a mucocele of the appendix by ct scan.      3.   Medium-sized internal hemorrhoids.      4.   Otherwise, normal-appearing mucosa to the terminal ileum.      5.   Status post surveillance biopsies of the terminal ileum and of the      colon every 10 centimeters and placed in pathology containers labeled right      colon, transverse colon, and left colon.            RECOMMENDATIONS      1.   Patient instructed for followup in our office in 1-2 weeks.      2.   Call our office in 1 week for pathology  results.      3.   Continue current management.      4.   Patient instructed to repeat colonoscopy in 1 year with surveillance      biopsies.      5.   Follow up with referring physician.                                                ___________________________________          Date Signed: __________      Halina Andreas, MD  ([id number])            D: 10/04/2004 by Halina Andreas, MD      T: 10/05/2004 by [id number] ([id number]) ([id number])      cc:  Halina Andreas, MD          Malena Catholic, MD

## 2012-06-28 NOTE — Discharge Summary (Signed)
DATE OF BIRTH:                        01/18/1956            ADMISSION DATE:                     11/08/2003      DISCHARGE DATE:                     11/13/2003            ATTENDING PHYSICIAN:                  Laretta Bolster, MD            DISCHARGE DIAGNOSES      1.   Pyelonephritis.      2.   Renal abscess.      3.   Asthma.      4.   History of kidney stones.            DISCHARGE MEDICATIONS:  Levaquin 500 mg once a day for 14 days, Augmentin      875 mg twice a day for 2 weeks, Claritin 10 mg once a day.            SPECIAL INSTRUCTIONS:  The patient was advised to follow up with his      primary care physician in 1 week, call her M.D. if she develops fever or      chills.  She was also advised to follow up with Dr. Hildred Alamin in 1 week, regular      diet.            REASON FOR HOSPITALIZATION:  This is a 57 year old obese Caucasian female      with a history of kidney stones, question Crohn's disease, asthma, who      presented to the emergency room with severe right-sided flank pain.  She      was found to have a dirty urine on admission to the ER and was febrile.            LABORATORY DATA:  On admission white count 11.9, creatinine 0.6.  The rest      of the labs per history and physical.            PHYSICAL EXAMINATION:  The patient had a temperature of 100.3, blood      pressure 124/80, rest of the physical examination benign except right flank      tenderness.            HOSPITAL COURSE:  The patient was admitted to the regular medical floor            1.   Right kidney abscess.  The patient was started on Zosyn and Flagyl.      ID was consulted.  Also urology was consulted.  Urine culture was negative.      The blood cultures were also negative.  She was febrile for the next few      days of hospitalization and subsequently became afebrile at which time her      IV antibiotics were switched to oral antibiotics.      2.   Asthma.  This was stable during her hospitalization.      3.   Diarrhea.  The patient received  Lomotil p.r.n.Marland Kitchen  Also stool was sent      for culture  which was negative.      4.   Pancreatitis.  The patient was initially on IV fluids and subsequently      started on p.o. when tolerated.            CONDITION ON DISCHARGE:  Patient was stable at the time of discharge.  The      patient had a repeat CAT scan which showed slight improvement in her renal      abscess.  The patient was seen by urology and because she looked clinically      stable with decreased white count, decreased pain and decreasing fever      trend, she was considered stable for discharge with 2 weeks of oral      antibiotics.                                                Electronic Signing MD: Laretta Bolster, MD  ([id number])            D: 03/04/2004 by Laretta Bolster, MD      T: 03/04/2004 by [id number] ([id number]) Dorris Carnes: [id number])      cc:  Laretta Bolster, MD

## 2012-09-12 ENCOUNTER — Ambulatory Visit: Payer: BLUE CROSS/BLUE SHIELD | Admitting: Obstetrics & Gynecology

## 2012-09-12 ENCOUNTER — Encounter: Payer: Self-pay | Admitting: Obstetrics & Gynecology

## 2012-09-12 VITALS — BP 122/78 | Temp 98.9°F | Wt 164.4 lb

## 2012-09-12 DIAGNOSIS — N9089 Other specified noninflammatory disorders of vulva and perineum: Secondary | ICD-10-CM

## 2012-09-12 NOTE — Progress Notes (Signed)
Patient is here today for a "cyst" on the left labia x 1 year.  Patient states that area is sometimes tender but not currently tender or inflamed today.  Patient would like to discuss removal options.

## 2012-10-25 ENCOUNTER — Ambulatory Visit
Admit: 2012-10-25 | Discharge: 2012-10-25 | Disposition: A | Discharge status: Home / Self Care | Payer: BLUE CROSS/BLUE SHIELD | Source: Ambulatory Visit | Attending: Orthopaedic Surgery | Admitting: Orthopaedic Surgery

## 2012-10-25 ENCOUNTER — Encounter
Disposition: A | Discharge status: Home / Self Care | Payer: Self-pay | Source: Ambulatory Visit | Attending: Orthopaedic Surgery

## 2012-10-25 ENCOUNTER — Ambulatory Visit: Payer: BLUE CROSS/BLUE SHIELD | Admitting: Orthopaedic Surgery

## 2012-10-25 DIAGNOSIS — Z538 Procedure and treatment not carried out for other reasons: Secondary | ICD-10-CM | POA: Insufficient documentation

## 2012-10-25 DIAGNOSIS — S62609B Fracture of unspecified phalanx of unspecified finger, initial encounter for open fracture: Secondary | ICD-10-CM | POA: Insufficient documentation

## 2012-10-25 HISTORY — DX: Shortness of breath: R06.02

## 2012-10-25 HISTORY — DX: Unspecified visual disturbance: H53.9

## 2012-10-25 HISTORY — DX: Pneumonia, unspecified organism: J18.9

## 2012-10-25 HISTORY — DX: Anemia, unspecified: D64.9

## 2012-10-25 HISTORY — DX: Cardiac arrhythmia, unspecified: I49.9

## 2012-10-25 HISTORY — DX: Unspecified osteoarthritis, unspecified site: M19.90

## 2012-10-25 HISTORY — DX: Other complications of anesthesia, initial encounter: T88.59XA

## 2012-10-25 HISTORY — DX: Nonrheumatic mitral (valve) prolapse: I34.1

## 2012-10-25 HISTORY — DX: Unspecified asthma, uncomplicated: J45.909

## 2012-10-25 HISTORY — DX: Calculus of kidney: N20.0

## 2012-10-25 HISTORY — DX: Encounter for other specified aftercare: Z51.89

## 2012-10-25 HISTORY — DX: Unspecified convulsions: R56.9

## 2012-10-25 SURGERY — CLOSED REDUCTION, PERCUTANEOUS FIXATION, FINGER
Anesthesia: Local | Site: Hand | Laterality: Left

## 2012-10-25 MED ORDER — LACTATED RINGERS IV SOLN
INTRAVENOUS | Status: DC | PRN
Start: 2012-10-25 — End: 2012-10-25

## 2012-10-25 SURGICAL SUPPLY — 28 items
APPLCATOR CHLORAPREP 26ML (Prep) ×2 IMPLANT
BANDAGE GZE CTTN MED KRLX 3.6YDX6.4IN LF (Dressing) ×1
BANDAGE KERLIX MEDIUM GAUZE L3.6 YD X (Dressing) ×1 IMPLANT
CORD BIPOLAR STRL DISP (Cautery) ×2 IMPLANT
DRAPE 84X54IN MINI C ARM OEC 6800 DISPOSABLE (Drape) ×1 IMPLANT
DRAPE CARM MN 84X54IN DISP OEC 6800 (Drape) ×2
GLOVE SRG PLISPRN 7.5 BGL PI ULTRATOUCH (Glove) ×1
GLOVE SURG BIOGEL INDIC SZ 7.5 (Glove) ×2 IMPLANT
GLOVE SURGICAL 7 1/2 BIOGEL PI (Glove) ×1
GLOVE SURGICAL 7 1/2 BIOGEL PI ULTRATOUCH G POWDER FREE ROUGH BEAD (Glove) ×1 IMPLANT
PAD ELECTROSRG GRND REM W CRD (Procedure Accessories) ×2 IMPLANT
SOLUTION IRR 0.9% NACL 500ML LF STRL PLS (Irrigation Solutions) ×1
SOLUTION IRRIGATION 0.9% SDM CHLORIDE 500ML PR BTTL ISOTONIC NONPRGNC (Irrigation Solutions) ×1 IMPLANT
SOLUTION IRRIGATION 0.9% SODIUM CHLORIDE (Irrigation Solutions) ×1
SPLINT ORTH PLSTR OF PARIS SPCLST 15X4IN (Cast) ×2
SPLINT ORTHOPEDIC 15X4IN FAST SET PLASTER OF PARIS SPECIALIST LF (Cast) ×1 IMPLANT
SPONGE GAUZE L4 IN X W4 IN 12 PLY (Sponge) ×2 IMPLANT
SPONGE GZE PLS CTTN CRTY 4X4IN LF STRL (Sponge) ×2
SUT ETHILON 4-0 PS2 18IN (Suture) ×2 IMPLANT
SUTURE ETHILON BLACK 4-0 FS-1 L18 IN (Suture) ×1
SUTURE ETHILON BLACK 4-0 FS-1 L18 IN MONOFILAMENT NONABSORBABLE (Suture) ×1 IMPLANT
SUTURE MONOCRYL 4-0 PS2 27IN (Suture) ×2 IMPLANT
SUTURE NABSB 4-0 FS1 ETH 18IN MFL BLK (Suture) ×1
TOURNIQUET 18IN STRL (Procedure Accessories) ×2 IMPLANT
TRAY HAND IAH (Pack) ×2 IMPLANT
TUBING SCT MDVC MXGR 9/32IN 12FT LF STRL (Suction) ×1
TUBING SUCTION ID9/32 IN L12 FT (Suction) ×1
TUBING SUCTION ID9/32 IN L12 FT NONCONDUCTIVE MALE TO MALE CONNECTOR (Suction) ×1 IMPLANT

## 2012-10-25 NOTE — Progress Notes (Signed)
OR cancelled. Pt ate meal at 1200, will be rescheduled for monday

## 2012-10-28 ENCOUNTER — Ambulatory Visit
Admit: 2012-10-28 | Discharge: 2012-10-28 | Disposition: A | Discharge status: Home / Self Care | Payer: BLUE CROSS/BLUE SHIELD | Source: Ambulatory Visit | Attending: Orthopaedic Surgery | Admitting: Orthopaedic Surgery

## 2012-10-28 ENCOUNTER — Encounter: Payer: Self-pay | Admitting: Anesthesiology

## 2012-10-28 ENCOUNTER — Ambulatory Visit: Payer: BLUE CROSS/BLUE SHIELD | Admitting: Anesthesiology

## 2012-10-28 ENCOUNTER — Ambulatory Visit: Payer: BLUE CROSS/BLUE SHIELD | Admitting: Orthopaedic Surgery

## 2012-10-28 ENCOUNTER — Encounter
Disposition: A | Discharge status: Home / Self Care | Payer: Self-pay | Source: Ambulatory Visit | Attending: Orthopaedic Surgery

## 2012-10-28 DIAGNOSIS — I059 Rheumatic mitral valve disease, unspecified: Secondary | ICD-10-CM | POA: Insufficient documentation

## 2012-10-28 DIAGNOSIS — J45909 Unspecified asthma, uncomplicated: Secondary | ICD-10-CM | POA: Insufficient documentation

## 2012-10-28 DIAGNOSIS — IMO0002 Reserved for concepts with insufficient information to code with codable children: Secondary | ICD-10-CM | POA: Insufficient documentation

## 2012-10-28 DIAGNOSIS — I499 Cardiac arrhythmia, unspecified: Secondary | ICD-10-CM | POA: Insufficient documentation

## 2012-10-28 DIAGNOSIS — M1990 Unspecified osteoarthritis, unspecified site: Secondary | ICD-10-CM | POA: Insufficient documentation

## 2012-10-28 DIAGNOSIS — J309 Allergic rhinitis, unspecified: Secondary | ICD-10-CM | POA: Insufficient documentation

## 2012-10-28 SURGERY — CLOSED REDUCTION, PERCUTANEOUS FIXATION, FINGER
Anesthesia: Anesthesia General | Site: Hand | Laterality: Left | Wound class: Clean

## 2012-10-28 MED ORDER — CLINDAMYCIN PHOSPHATE IN D5W 600 MG/50ML IV SOLN
INTRAVENOUS | Status: AC
Start: 2012-10-28 — End: ?
  Filled 2012-10-28: qty 50

## 2012-10-28 MED ORDER — DEXAMETHASONE SODIUM PHOSPHATE 4 MG/ML IJ SOLN
INTRAMUSCULAR | Status: DC | PRN
Start: 2012-10-28 — End: 2012-10-28
  Administered 2012-10-28: 8 mg via INTRAVENOUS

## 2012-10-28 MED ORDER — CLINDAMYCIN PHOSPHATE IN D5W 600 MG/50ML IV SOLN
600.00 mg | Freq: Once | INTRAVENOUS | Status: AC
Start: 2012-10-28 — End: 2012-10-28
  Administered 2012-10-28: 600 mg via INTRAVENOUS

## 2012-10-28 MED ORDER — FENTANYL CITRATE 0.05 MG/ML IJ SOLN
25.0000 ug | INTRAMUSCULAR | Status: AC | PRN
Start: 2012-10-28 — End: 2012-10-28
  Administered 2012-10-28 (×3): 25 ug via INTRAVENOUS

## 2012-10-28 MED ORDER — ONDANSETRON HCL 4 MG/2ML IJ SOLN
4.0000 mg | Freq: Once | INTRAMUSCULAR | Status: DC | PRN
Start: 2012-10-28 — End: 2012-10-29

## 2012-10-28 MED ORDER — HYDROMORPHONE HCL 2 MG/ML IJ SOLN
INTRAMUSCULAR | Status: AC
Start: 2012-10-28 — End: ?
  Filled 2012-10-28: qty 1

## 2012-10-28 MED ORDER — FENTANYL CITRATE 0.05 MG/ML IJ SOLN
INTRAMUSCULAR | Status: AC
Start: 2012-10-28 — End: 2012-10-28
  Administered 2012-10-28: 25 ug via INTRAVENOUS
  Filled 2012-10-28: qty 2

## 2012-10-28 MED ORDER — OXYCODONE-ACETAMINOPHEN 5-325 MG PO TABS
1.0000 | ORAL_TABLET | Freq: Once | ORAL | Status: DC | PRN
Start: 2012-10-28 — End: 2012-10-29

## 2012-10-28 MED ORDER — EPHEDRINE SULFATE 50 MG/ML IJ SOLN
INTRAMUSCULAR | Status: DC | PRN
Start: 2012-10-28 — End: 2012-10-28
  Administered 2012-10-28: 10 mg via INTRAVENOUS

## 2012-10-28 MED ORDER — MIDAZOLAM HCL 2 MG/2ML IJ SOLN
INTRAMUSCULAR | Status: DC | PRN
Start: 2012-10-28 — End: 2012-10-28
  Administered 2012-10-28: 2 mg via INTRAVENOUS

## 2012-10-28 MED ORDER — FAMOTIDINE 10 MG/ML IV SOLN (WRAP)
INTRAVENOUS | Status: DC | PRN
Start: 2012-10-28 — End: 2012-10-28
  Administered 2012-10-28: 20 mg via INTRAVENOUS

## 2012-10-28 MED ORDER — PROMETHAZINE HCL 25 MG/ML IJ SOLN
6.2500 mg | Freq: Once | INTRAMUSCULAR | Status: AC | PRN
Start: 2012-10-28 — End: 2012-10-28

## 2012-10-28 MED ORDER — FENTANYL CITRATE 0.05 MG/ML IJ SOLN
INTRAMUSCULAR | Status: DC | PRN
Start: 2012-10-28 — End: 2012-10-28
  Administered 2012-10-28 (×4): 25 ug via INTRAVENOUS
  Administered 2012-10-28 (×3): 50 ug via INTRAVENOUS

## 2012-10-28 MED ORDER — FENTANYL CITRATE 0.05 MG/ML IJ SOLN
INTRAMUSCULAR | Status: AC
Start: 2012-10-28 — End: ?
  Filled 2012-10-28: qty 5

## 2012-10-28 MED ORDER — FAMOTIDINE 10 MG/ML IV SOLN (WRAP)
INTRAVENOUS | Status: AC
Start: 2012-10-28 — End: ?
  Filled 2012-10-28: qty 2

## 2012-10-28 MED ORDER — HYDROMORPHONE HCL PF 1 MG/ML IJ SOLN
0.2000 mg | INTRAMUSCULAR | Status: AC | PRN
Start: 2012-10-28 — End: 2012-10-28
  Administered 2012-10-28 (×3): 0.2 mg via INTRAVENOUS

## 2012-10-28 MED ORDER — BUPIVACAINE HCL (PF) 0.5 % IJ SOLN
INTRAMUSCULAR | Status: DC | PRN
Start: 2012-10-28 — End: 2012-10-28
  Administered 2012-10-28: 8 mL

## 2012-10-28 MED ORDER — PROPOFOL INFUSION 10 MG/ML
INTRAVENOUS | Status: DC | PRN
Start: 2012-10-28 — End: 2012-10-28
  Administered 2012-10-28: 150 mg via INTRAVENOUS

## 2012-10-28 MED ORDER — LIDOCAINE HCL 2 % IJ SOLN
INTRAMUSCULAR | Status: DC | PRN
Start: 2012-10-28 — End: 2012-10-28
  Administered 2012-10-28: 60 mg via INTRAVENOUS

## 2012-10-28 MED ORDER — MIDAZOLAM HCL 2 MG/2ML IJ SOLN
INTRAMUSCULAR | Status: AC
Start: 2012-10-28 — End: ?
  Filled 2012-10-28: qty 2

## 2012-10-28 MED ORDER — LACTATED RINGERS IV SOLN
INTRAVENOUS | Status: DC | PRN
Start: 2012-10-28 — End: 2012-10-29
  Administered 2012-10-28: 1000 mL via INTRAVENOUS

## 2012-10-28 MED ORDER — HYDROMORPHONE HCL PF 1 MG/ML IJ SOLN
INTRAMUSCULAR | Status: AC
Start: 2012-10-28 — End: 2012-10-28
  Administered 2012-10-28: 0.2 mg via INTRAVENOUS
  Filled 2012-10-28: qty 1

## 2012-10-28 MED ORDER — PROMETHAZINE HCL 25 MG/ML IJ SOLN
INTRAMUSCULAR | Status: AC
Start: 2012-10-28 — End: 2012-10-28
  Administered 2012-10-28: 6.25 mg via INTRAVENOUS
  Filled 2012-10-28: qty 1

## 2012-10-28 MED ORDER — ONDANSETRON HCL 4 MG/2ML IJ SOLN
INTRAMUSCULAR | Status: DC | PRN
Start: 2012-10-28 — End: 2012-10-28
  Administered 2012-10-28: 4 mg via INTRAVENOUS

## 2012-10-28 SURGICAL SUPPLY — 42 items
APPLCATOR CHLORAPREP 26ML (Prep) ×2 IMPLANT
BANDAGE GZE CTTN MED KRLX 3.6YDX6.4IN LF (Dressing)
BANDAGE KERLIX MEDIUM GAUZE L3.6 YD X (Dressing) ×1 IMPLANT
CORD BIPOLAR STRL DISP (Cautery) ×1 IMPLANT
DRAPE 84X54IN MINI C ARM OEC 6800 DISPOSABLE (Drape) ×1 IMPLANT
DRAPE CARM MN 84X54IN DISP OEC 6800 (Drape) ×2
DRESSING PETRO 3% BI 3BRM GZE XR 8X1IN (Dressing) ×1
DRESSING PETROLATUM XEROFORM L8 IN X W1 (Dressing) ×1
DRESSING PETROLATUM XEROFORM L8 IN X W1 IN 3% BISMUTH TRIBROMOPHENATE (Dressing) IMPLANT
GLOVE SRG PLISPRN 7.5 BGL PI ULTRATOUCH (Glove) ×1
GLOVE SURG BIOGEL INDIC SZ 7.5 (Glove) ×2 IMPLANT
GLOVE SURGICAL 7 1/2 BIOGEL PI (Glove) ×1
GLOVE SURGICAL 7 1/2 BIOGEL PI ULTRATOUCH G POWDER FREE ROUGH BEAD (Glove) ×1 IMPLANT
PAD ELECTROSRG GRND REM W CRD (Procedure Accessories) ×1 IMPLANT
PADDING CAST L4 YD X W3 IN UNDERCAST (Cast) ×1
PADDING CAST L4YD XW3IN UNDERCAST MILD STRETCH CHSV WEBRIL COTTON (Cast) IMPLANT
PADDING CST CTTN WBRL 4YDX3IN LF STRL (Cast) ×1
SOLUTION IRR 0.9% NACL 500ML LF STRL PLS (Irrigation Solutions) ×1
SOLUTION IRRIGATION 0.9% SDM CHLORIDE 500ML PR BTTL ISOTONIC NONPRGNC (Irrigation Solutions) ×1 IMPLANT
SOLUTION IRRIGATION 0.9% SODIUM CHLORIDE (Irrigation Solutions) ×1
SPLINT CONFORMABLE 4X15IN (Cast) ×1 IMPLANT
SPLINT ORTH PLSTR OF PARIS SPCLST 15X4IN (Cast) ×2
SPLINT ORTHOPEDIC 15X4IN FAST SET PLASTER OF PARIS SPECIALIST LF (Cast) ×1 IMPLANT
SPONGE GAUZE L4 IN X W4 IN 12 PLY (Sponge) ×2 IMPLANT
SPONGE GAUZE L4 IN X W4 IN 16 PLY (Dressing) ×1
SPONGE GAUZE L4 IN X W4 IN 16 PLY MAXIMUM ABSORBENT USP TYPE VII (Dressing) IMPLANT
SPONGE GZE CTTN CRTY 4X4IN LF NS 16 PLY (Dressing) ×1
SPONGE GZE PLS CTTN CRTY 4X4IN LF STRL (Sponge) ×2
SUT ETHILON 4-0 PS2 18IN (Suture) ×1 IMPLANT
SUTURE ETHILON BLACK 4-0 FS-1 L18 IN (Suture)
SUTURE ETHILON BLACK 4-0 FS-1 L18 IN MONOFILAMENT NONABSORBABLE (Suture) ×1 IMPLANT
SUTURE MONOCRYL 4-0 PS2 27IN (Suture) ×1 IMPLANT
SUTURE NABSB 4-0 FS1 ETH 18IN MFL BLK (Suture)
TOURNIQUET 18IN STRL (Procedure Accessories) ×2 IMPLANT
TRAY HAND IAH (Pack) ×2 IMPLANT
TUBING SCT MDVC MXGR 9/32IN 12FT LF STRL (Suction)
TUBING SUCTION ID9/32 IN L12 FT (Suction)
TUBING SUCTION ID9/32 IN L12 FT NONCONDUCTIVE MALE TO MALE CONNECTOR (Suction) ×1 IMPLANT
WIRE FIXATION OD.028 IN L4 IN KIRSCHNER STAINLESS STEEL 2 END TROCAR (Guide Wire) IMPLANT
WIRE FIXATION OD.035 IN L6 IN KIRSCHNER STAINLESS STEEL 1 END TROCAR (Guide Wire) IMPLANT
WIRE FX SS KRSH .028IN 4IN NS 2 END TROC (Guide Wire) ×2
WIRE FX SS KRSH .035IN 6IN NS 1 END TROC (Guide Wire) ×4

## 2012-10-28 NOTE — H&P (Signed)
ADMISSION HISTORY AND PHYSICAL EXAM    Date Time: 10/28/2012 5:56 PM  Patient Name: Chelsea Montoya  Attending Physician: Arta Bruce, MD    Assessment:   57 yo F with Left ring finger middle phalanx fracture.    Plan:   CRPP vs. ORIF left ring finger middle phalanx fracture.    History of Present Illness:   Chelsea Montoya is a 57 y.o. female who presents to the hospital with above injury plan is for operative fixation.    Past Medical History:     Past Medical History   Diagnosis Date   . Endometriosis, cervix    . Crohn's disease    . Pyelonephritis    . Crohn's colitis 01/31/2012   . Complication of anesthesia      wake up fast   . PONV (postoperative nausea and vomiting)      sometimes   . Arrhythmia    . Mitral valve prolapse    . Asthma without status asthmaticus      allergy-induced only   . Pneumonia    . Shortness of breath      R/T allergy   . Seizures      30 years ago R/T stress   . Kidney stone    . Urinary tract infection    . Anemia      resolved   . Blood transfusion without reported diagnosis      14 years ago   . Vision abnormalities    . Arthritis      osteoarthritis       Past Surgical History:     Past Surgical History   Procedure Date   . Appendectomy    . Gynecologic cryosurgery    . Colectomy      colon resection   . Bunionectomy    . Knee replacement x 2        Family History:     Family History   Problem Relation Age of Onset   . Ovarian cancer Maternal Grandmother    . Breast cancer Paternal Grandmother    . Ovarian cancer Paternal Aunt    . Uterine cancer Cousin        Social History:     History     Social History   . Marital Status: Married     Spouse Name: N/A     Number of Children: N/A   . Years of Education: N/A     Social History Main Topics   . Smoking status: Never Smoker    . Smokeless tobacco: Not on file   . Alcohol Use: Yes      Comment: occasionally   . Drug Use: No   . Sexually Active: No     Other Topics Concern   . Not on file     Social History Narrative   . No  narrative on file       Allergies:     Allergies   Allergen Reactions   . Darvon Other (See Comments)     Gastric distress     . Keflex (Cephalexin) Other (See Comments)     Gastric upset.   . Ciprofloxacin Rash   . Sulfa Antibiotics Rash       Medications:     Prescriptions prior to admission   Medication Sig   . ALBUTEROL IN Inhale into the lungs as needed.   . ALPRAZolam (XANAX PO) Take by mouth as needed.   . Fluticasone-Salmeterol (ADVAIR  HFA IN) Inhale into the lungs.   . Oxycodone-Acetaminophen (PERCOCET PO) Take by mouth as needed.   . pseudoephedrine (SUDAFED) 30 MG tablet Take 30 mg by mouth every 4 (four) hours as needed.   . Zolpidem Tartrate (AMBIEN PO) Take by mouth nightly.   . MethylPREDNISolone (MEDROL, PAK, PO) Take by mouth as needed.   . tiotropium (SPIRIVA) 18 MCG inhalation capsule Place 18 mcg into inhaler and inhale daily.       Review of Systems:   A comprehensive review of systems was: Negative except left hand pain and deformity.    Physical Exam:     Filed Vitals:    10/28/12 1631   BP: 123/63   Pulse: 77   Temp: 98.6 F (37 C)   Resp: 16   SpO2: 100%       Intake and Output Summary (Last 24 hours) at Date Time  No intake or output data in the 24 hours ending 10/28/12 1756    General appearance - alert, well appearing, and in no distress  Neck - supple, no significant adenopathy  Chest - no tachypnea, retractions or cyanosis  Heart - normal rate and regular rhythm  Abdomen - no rebound tenderness noted  Neurological - alert, oriented, normal speech, no focal findings or movement disorder noted  Musculoskeletal - abnormal exam of left hand, positive rotational deformity,  Extremities - peripheral pulses normal, no pedal edema, no clubbing or cyanosis    Labs:     Results     ** No Results found for the last 24 hours. **              Rads:   Radiological Procedure reviewed.    Signed by: Arta Bruce

## 2012-10-28 NOTE — Brief Op Note (Signed)
BRIEF OP NOTE    Date Time: 10/28/2012 8:01 PM    Patient Name:   Chelsea Montoya    Date of Operation:   10/28/2012    Providers Performing:   Surgeon(s):  Arta Bruce, MD    Assistant (s):   Cox, Lynette, RN - Circulator  Gurango, Kerney Elbe, RN - Scrub Person  Stamps, Wortham - First Assistant    Operative Procedure:   Procedure(s):  CLOSED REDUCTION, PERCUTANEOUS FIXATION, FINGER    Preoperative Diagnosis:   Pre-Op Diagnosis Codes:     * Phalanx (hand) fracture, closed, initial encounter [816.00]    Postoperative Diagnosis:   fractured 4 finger     Anesthesia:   General    Estimated Blood Loss:    * No values recorded between 10/28/2012  7:31 PM and 10/28/2012  8:01 PM *    Implants:     Implant Name Type Inv. Item Serial No. Manufacturer Lot No. LRB No. Used Action   0.028   k wires    Left 1 Implanted   0.035     k wires      Left 2 Implanted       Drains:   Drains: no    Specimens:       Findings:   Left ring finger middle phalanx intra-articular fracture.    Complications:   None.      Signed by: Arta Bruce, MD                                                                           [facility]

## 2012-10-28 NOTE — Discharge Instructions (Addendum)
Elevate operative hand on 3 to 4 pillows while seated or lying down for next 72 hours.  May gently flex and extend fingers as dressing allows.  Do not squeeze a ball or perform any strengthening exercises.  May place ice on the palm side of the hand for 30 minutes on, 30 minutes off for the first 48 hours.  May not drive while taking narcotic pain medication.  Call to schedule follow-up appointment with Dr. Thomas for one weeks time.  May move shoulder and elbow gently.  No lifting > 5lbs. With the operative hand.  Notify MD if fever> 101.5 degrees F, numbness that lasts longer than 48 hours, excessive bloody or foul-smelling drainage.  If having chest pain or shortness of breath call 911.   Anesthesia: After Your Surgery  You've just had surgery. During surgery, you received medication called anesthesia to keep you comfortable and pain-free. After surgery, you may experience some pain or nausea. This is normal. Here are some tips for feeling better and recovering after surgery.     Stay on schedule with your medication.   Going Home  Your doctor or nurse will show you how to take care of yourself when you go home. He or she will also answer your questions. Have an adult family member or friend drive you home. For the first 24 hours after your surgery:   Do not drive or use heavy equipment.   Do not make important decisions or sign legal documents.   Avoid alcohol.   Have someone stay with you, if needed. He or she can watch for problems and help keep you safe.  Be sure to keep all follow-up doctor's appointments. And rest after your procedure for as long as your doctor tells you to.  Coping with Pain  If you have pain after surgery, pain medication will help you feel better. Take it as directed, before pain becomes severe. Also, ask your doctor or pharmacist about other ways to control pain, such as with heat, ice, and relaxation. And follow any other instructions your surgeon or nurse gives you.  Tips for  Taking Pain Medication  To get the best relief possible, remember these points:   Pain medications can upset your stomach. Taking them with a little food may help.   Most pain relievers taken by mouth need at least 20 to 30 minutes to take effect.   Taking medication on a schedule can help you remember to take it. Try to time your medication so that you can take it before beginning an activity, such as dressing, walking, or sitting down for dinner.   Constipation is a common side effect of pain medications. Contact your doctor before taking any medications like laxatives or stool softeners to help relieve constipation. Also ask about any dietary restrictions, because drinkinglots of fluids andeating foodslikefruits and vegetables that are high in fiber can also help. Remember, don't take laxatives unless your surgeon has prescribed them.   Mixing alcohol and pain medication can cause dizziness and slow your breathing. It can even be fatal. Don't drink alcohol while taking pain medication.   Pain medication can slow your reflexes. Don't drive or operate machinery while taking pain medication.  If your health care provider advises you to take acetaminophen, the generic name for Tylenol and other brand-name pain relievers, to help relieve your pain, ask for a daily dose. Remember that acetaminophen or other pain relievers may interact with prescription medicines or other over-the-counter (OTC) drugs. The FDA   recommends reading OTC medication labels carefully to clearly understand the list of active ingredients, directions, and any precautions to help avoid taking too muchacetaminophen. If you have questions, ask your pharmacist or health care provider.  Managing Nausea  Some people have an upset stomach after surgery. This is often due to anesthesia, pain, pain medications, or the stress of surgery. The following tips will help you manage nausea and get good nutrition as you recover. If you were on a special  diet before surgery, ask your doctor if you should follow it during recovery. These tips may help:   Don't push yourself to eat. Your body will tell you what to eat and when.   Start off with clear liquids and soup. They are easier to digest.   Progress to semisolids (mashed potatoes, applesauce, and gelatin) as you feel ready.   Slowly move to solid foods. Don't eat fatty, rich, or spicy foods at first.   Don't force yourself to have three large meals a day. Instead, eat smaller amounts more often.   Take pain medications with a small amount of solid food, such as crackers or toast to avoid nausea.  Call Your Surgeon If.   You still have pain an hour after taking medication (it may not be strong enough).   You feel too sleepy, dizzy, or groggy (medication may be too strong).   You have side effects like nausea, vomiting, or skin changes (rash, itching, or hives).    2000-2013 [employer], [address], [place] [address]. All rights reserved. This information is not intended as a substitute for professional medical care. Always follow your healthcare professional's instructions.

## 2012-10-28 NOTE — Anesthesia Preprocedure Evaluation (Signed)
Anesthesia Evaluation    AIRWAY    Mallampati: II    TM distance: >3 FB  Neck ROM: full  Mouth Opening:full   CARDIOVASCULAR    regular, normal and murmur  Systolic Grade: 2/6     DENTAL        (+) upper dentures   PULMONARY    clear to auscultation     OTHER FINDINGS    PONV (postoperative nausea and vomiting) Endometriosis, cervix     Crohn's disease Pyelonephritis     Crohn's colitis Complication of anesthesia     Arrhythmia Mitral valve prolapse     Asthma without status,seasonal, last inhaler usage 15 days ago.   asthmaticus Pneumonia     Shortness of breath Seizures     Kidney stone Urinary tract infection     Anemia Blood transfusion without reported diagnosis     Vision abnormalities Arthritis                         Anesthesia Plan    ASA 3   general           Anethesia plans discussed with CRNA  (Pt. Had left knee replacement in 04/2012. No anaesthesia problems.    The most common side effects from anesthesia are nausea, vomiting, sore throat.  Uncommon side effects include but not limited to  injury to eyes, lips, teeth, gums, vocal cords, allergic rections, nerve injuries. In addition there are side effects associated with your medical conditions.    Answered all questions to patient's satisfaction.  Pt understands and wishes to proceed.    Wandra Mannan, MD  10/28/2012 )      intravenous induction     informed consent obtained    Plan discussed with CRNA.

## 2012-10-28 NOTE — Transfer of Care (Signed)
Anesthesia Transfer of Care Note    Patient: Chelsea Montoya    Procedures performed: Procedure(s) with comments:  CLOSED REDUCTION, PERCUTANEOUS FIXATION, FINGER - Percutaneous Fixation of Left Ring Finger.    *Add On Case*    Anesthesia type: General LMA    Patient location:Phase I PACU    Last vitals:   Filed Vitals:    10/28/12 2005   BP: 135/80   Pulse: 85   Temp: 98.1 F (36.7 C)   Resp: 12   SpO2: 100%       Post pain: Patient not complaining of pain, continue current therapy      Mental Status:awake and alert     Respiratory Function: tolerating nasal cannula    Cardiovascular: stable    Nausea/Vomiting: patient not complaining of nausea or vomiting    Hydration Status: adequate    Post assessment: no apparent anesthetic complications, no reportable events and no evidence of recall

## 2012-10-29 ENCOUNTER — Encounter: Payer: Self-pay | Admitting: Orthopaedic Surgery

## 2012-10-29 NOTE — Op Note (Signed)
Procedure Date: 10/28/2012     Patient Type: A     SURGEON: Norva Karvonen MD  ASSISTANT:       ASSISTANT:  Lerry Paterson, SA.     PREOPERATIVE DIAGNOSIS:  Left ring finger middle phalanx fracture intraarticular.     POSTOPERATIVE DIAGNOSIS:  Left ring finger middle phalanx fracture intraarticular.     TITLE OF PROCEDURE:  Closed reduction and percutaneous pinning of left ring finger middle  phalanx intraarticular fracture.     ANESTHESIA:  General endotracheal with LMA and local anesthetic digital block with a  total 10 mL 0.5% plain bupivacaine.     FLUIDS:  700 mL lactated ringer's.     ESTIMATED BLOOD LOSS:  Less than 10 mL.     DRAINS:  None.     SPECIMENS:  None.     COMPLICATIONS:  None.     CONDITION:  The patient was stable to the PACU.       BRIEF PREOPERATIVE COURSE:  Chelsea Montoya is a 57 year old female who injured her left ring finger while  walking her dog roughly 1 week prior to surgical intervention.  She had an  unstable intraarticular middle phalanx fracture of the left ring finger  with accompanying deformity.  She was counseled as to the benefits of the  operative intervention to restore the axis of the finger, to improve the  deformity and improve overall hand function.  I discussed in detail with  the patient the risks, benefits and alternatives to the procedure, the main  risks to include bleeding, infection, nerve, artery, tendon damage, failure  of repair, need for further surgery, stiffness, scarring and pain.  After  weighing the risks, benefits and options with the idea to improve her  overall hand function the patient wished to undergo operative intervention.     DESCRIPTION OF PROCEDURE:  The patient was identified in the preoperative area and the left ring  finger was marked.  Consent was obtained.  The patient received 600 mg of  IV clindamycin within 30 minutes of the surgical incision for antibiotic  prophylaxis.  The patient was brought to the operating room where she was  placed  supine on the operating room table with the hand table attached to  the left side.  All bony prominences and extremities were well padded.  A  well-padded tourniquet was applied to the left brachium and then general  endotracheal anesthesia was induced and an LMA was placed.  The table was  rotated 90 degrees with respect to the anesthesia provider to provide  access to the left upper extremity and then a timeout occurred identifying  the patient, the procedure she was to undergo and the operative extremity.   Prior to prepping and draping the mini-C-arm was brought into the field and  was used to attempt a closed reduction to see if closed reduction could be  performed.  It was found that the fracture was difficult to reduce with  appropriate traction and direct pressure but could be reduced into an  adequate position.  The left upper extremity was then prepped from the tips  of the fingers to the level of the tourniquet with ChloraPrep and was  draped in the usual sterile fashion.  Initially, a pointed bone reduction  forceps was used percutaneously through the skin to close down the  articular surface at the level of the proximal interphalangeal joint with  direct dorsal and volar pressure.  With this fracture callus broken  up and  a reduction that could be affected using a combination of traction and  flexion a 0.35 mm K-wire was placed dorsally through the dorsal fragment of  the articular surface and into the volar lip securing it in place and  regaining length and anatomic reduction of the joint surface.  This was  followed by placement of a second 0.35-inch K-wire adjacent to this across  the joint surface strutting the joint out into appropriate position and  gaining further stability.  Following this a 0.28-inch K-wire was placed  from the distal ulnar aspect of the distal fragment into the dorsal lip  fragment from distal to proximal gaining another point of fixation and a  stabilizing rotational position  of the distal long fragment.  The  mini-C-arm was used to verify anatomic reduction of the PIP joint surface.   The length based on the volar cortex had been restored while there was  still a small gap on the dorsal cortex that was felt to be negligible as  the joint surface was reduced and length had been reconstituted.  An AP  view showed that the finger was in line with its appropriate location and  rotation was checked by verifying that with full flexion the digit did not  cross under the adjacent long finger or small finger.  At this point the  wires were cut short and Jurgen balls were applied.  The patient then  underwent a local anesthetic digital block with 10 mL of 0.5% plain  bupivacaine.  All counts were correct.  The fingertip was pink, warm and  well perfused at the end of the case.  The wound was dressed sterilely with  Xeroform, 4 x 4's and sterile Webril.  The patient was placed in an ulnar  gutter splint in the neutral position, awakened and taken to the recovery  room in satisfactory and stable condition.     DISPOSITION:  The patient will be discharged to home with Percocet for pain control and  instructions on strict elevation of the left upper extremity.  She may use  her hand for light activity.  I will see her back in 1 week's time for a  splint takedown, radiographic alignment check and advancement to gentle  range of motion.           D:  10/29/2012 08:30 AM by Dr. Rose Phi. Maisie Fus, MD ([id number])  T:  10/29/2012 09:07 AM by Justice Deeds      [name]: [id number]) (Doc ID: [id number])

## 2012-11-01 NOTE — Anesthesia Postprocedure Evaluation (Signed)
Anesthesia Post Evaluation    Patient: Chelsea Montoya    Procedures performed: Procedure(s) with comments:  CLOSED REDUCTION, PERCUTANEOUS FIXATION, FINGER - Percutaneous Fixation of Left Ring Finger.    *Add On Case*    Anesthesia type: General LMA    Patient location:PACU    Last vitals:   Filed Vitals:    10/28/12 2145   BP: 123/68   Pulse: 71   Temp:    Resp: 17   SpO2: 96%       Post pain: Patient not complaining of pain, continue current therapy      Mental Status:awake    Respiratory Function: tolerating room air    Cardiovascular: stable    Nausea/Vomiting: patient not complaining of nausea or vomiting    Hydration Status: adequate    Post assessment: no apparent anesthetic complications

## 2013-02-03 ENCOUNTER — Encounter: Payer: Self-pay | Admitting: Obstetrics & Gynecology

## 2013-05-21 ENCOUNTER — Ambulatory Visit: Payer: BLUE CROSS/BLUE SHIELD | Admitting: Obstetrics & Gynecology

## 2013-05-21 ENCOUNTER — Encounter: Payer: Self-pay | Admitting: Obstetrics & Gynecology

## 2013-05-21 VITALS — BP 114/72 | HR 74 | Temp 97.8°F | Ht 60.5 in | Wt 181.0 lb

## 2013-05-21 NOTE — Progress Notes (Signed)
Last pap 01/31/2012 normal, negative HPV  Last MMG 02/22/2012 Birads 01  Last DEXA 02/22/2012 [facility] normal  Last Colonoscopy 2014 1 polyp- return 3 years

## 2013-05-22 LAB — URINALYSIS WITH MICROSCOPIC
Bilirubin, UA: NEGATIVE
Blood, UA: NEGATIVE
Glucose Qualitative: NEGATIVE
Hyaline Casts, UA: NONE SEEN
Ketones UA: NEGATIVE
Leukocyte Esterase, UA: NEGATIVE
NITRITE: NEGATIVE
Protein, UA: NEGATIVE
RBC UA: NONE SEEN (ref 0–3)
Specific Gravity, UA: 1.006 (ref 1.001–1.035)
Urine Bacteria: NONE SEEN
WBC: NONE SEEN (ref 0–5)
pH: 5 (ref 5.0–8.0)

## 2013-05-25 ENCOUNTER — Encounter: Payer: Self-pay | Admitting: Obstetrics & Gynecology

## 2013-05-25 DIAGNOSIS — Z803 Family history of malignant neoplasm of breast: Secondary | ICD-10-CM | POA: Insufficient documentation

## 2013-05-25 DIAGNOSIS — I499 Cardiac arrhythmia, unspecified: Secondary | ICD-10-CM | POA: Insufficient documentation
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
## 2013-05-28 LAB — HPV MRNA E6-E7: HPV RNA, High Risk, E6-E7, TMA: NOT DETECTED

## 2013-05-28 LAB — THINPREP IMAGING PAP & HPV MRNA E6/E7.

## 2013-08-12 NOTE — Progress Notes (Signed)
Name: Chelsea Montoya   Date of Service:09/12/2012  Transcribed from paper record 08/12/2013   Reason For Visit: labial lesion    Patient Active Problem List   Diagnosis   . Crohn's colitis   . History of colon resection   . Mitral valve prolapse   . Pyelonephritis   . Kidney stone   . Arrhythmia   . PGM breast cancer age 58   . paternal aunts x 2 ovarian cancer   . Prolapse of anterior vaginal wall       Patient is seen today for complaint of left labial lesion present per patient for 1 year that is sometimes tender and itchy. It is not currently bothering the patient. No report of bleeding or oozing    Filed Vitals:    09/12/12 1707   BP: 122/78   Temp: 98.9 F (37.2 C)     Labia : no lesions discrete to the labia are visualized today  Vagina - anterior wall prolapse to the introitus, no lesions    RTO with recurrent symptoms

## 2013-11-09 ENCOUNTER — Encounter: Payer: Self-pay | Admitting: Obstetrics & Gynecology

## 2013-11-20 ENCOUNTER — Ambulatory Visit
Admission: RE | Admit: 2013-11-20 | Discharge: 2013-11-20 | Disposition: A | Discharge status: Home / Self Care | Payer: BLUE CROSS/BLUE SHIELD | Source: Ambulatory Visit | Attending: Critical Care Medicine | Admitting: Critical Care Medicine

## 2013-11-20 ENCOUNTER — Other Ambulatory Visit: Payer: Self-pay | Admitting: Critical Care Medicine

## 2013-11-20 DIAGNOSIS — R0602 Shortness of breath: Secondary | ICD-10-CM

## 2014-03-16 ENCOUNTER — Other Ambulatory Visit: Payer: Self-pay

## 2014-03-16 ENCOUNTER — Telehealth: Payer: Self-pay

## 2014-03-16 DIAGNOSIS — R1084 Generalized abdominal pain: Secondary | ICD-10-CM

## 2014-03-16 DIAGNOSIS — R140 Abdominal distension (gaseous): Secondary | ICD-10-CM

## 2014-03-16 NOTE — Telephone Encounter (Signed)
03/16/14 1200 Patient called office today c/o abdominal discomfort with bloating for 1 week, has HX crohns and FHX ovarian cancer, denies fever, denies N/V, voiding without difficulty, no changes with bowel. Dr.Maser spoke to patient today by phone-plan: CBC CMP, UAC&S, appt this week. Labs ordered in epic and gave patient instructions to go to [facility] today, appt scheduled for 03/18/14 at 1500 with Dr.Maser

## 2014-03-18 ENCOUNTER — Ambulatory Visit: Payer: BLUE CROSS/BLUE SHIELD | Admitting: Obstetrics & Gynecology

## 2014-03-18 ENCOUNTER — Encounter: Payer: Self-pay | Admitting: Obstetrics & Gynecology

## 2014-03-18 VITALS — BP 120/74 | HR 81 | Temp 98.2°F | Ht 61.5 in | Wt 191.0 lb

## 2014-03-18 DIAGNOSIS — R52 Pain, unspecified: Secondary | ICD-10-CM

## 2014-03-18 DIAGNOSIS — B9620 Unspecified Escherichia coli [E. coli] as the cause of diseases classified elsewhere: Secondary | ICD-10-CM

## 2014-03-18 DIAGNOSIS — R109 Unspecified abdominal pain: Secondary | ICD-10-CM

## 2014-03-18 DIAGNOSIS — N390 Urinary tract infection, site not specified: Secondary | ICD-10-CM

## 2014-03-18 DIAGNOSIS — K50119 Crohn's disease of large intestine with unspecified complications: Secondary | ICD-10-CM

## 2014-03-18 DIAGNOSIS — A498 Other bacterial infections of unspecified site: Secondary | ICD-10-CM

## 2014-03-18 DIAGNOSIS — N3090 Cystitis, unspecified without hematuria: Secondary | ICD-10-CM

## 2014-03-18 DIAGNOSIS — K5010 Crohn's disease of large intestine without complications: Secondary | ICD-10-CM

## 2014-03-18 MED ORDER — FLUCONAZOLE 150 MG PO TABS
ORAL_TABLET | ORAL | Status: DC
Start: 2014-03-18 — End: 2014-04-22

## 2014-03-18 MED ORDER — LEVOFLOXACIN 500 MG PO TABS
500.0000 mg | ORAL_TABLET | Freq: Every day | ORAL | Status: AC
Start: 2014-03-18 — End: 2014-03-25

## 2014-03-18 NOTE — Progress Notes (Signed)
Common GYN tests  Last Pap Date: 05/21/13  Last Pap Result: Normal (-HPV)  Last Mammo Date: 02/22/12  Last Mammo Result: Normal (Birads 01 -[facility])  Last Colonoscopy Date: 01/24/13  Last Colonoscopy Result: Normal (Dr [facility])  Last Dexa Date: 02/22/12  Last Dexa Result: Normal (T score 0.6)

## 2014-03-18 NOTE — Progress Notes (Signed)
Date of service 03/18/2014    Pain below the umbilicus especially in the sitting postion  Abdominal bloating  No hematuria    Nausea no vomiting      Filed Vitals:    03/18/14 1534   BP: 120/74   Pulse: 81   Temp: 98.2 F (36.8 C)     Abdomen soft tender widespread no rebound   Cystocele grade 3       CBC CMP negative  Urine culture E. Coli 100,000 CFU  UA leukocyte esterase +        Bacteria = many       WBC's 6-10         RBC's   Ultrasound renal pelvis with PVR   levaquin 500 mg po 7 days -10 days  She has exposure to levaquin without allergic reaction   Diflucan 150 # 2  GI appointment for evaluation of Crohn's

## 2014-03-19 LAB — URINALYSIS WITH MICROSCOPIC
Bilirubin, UA: NEGATIVE
Blood, UA: NEGATIVE
Glucose Qualitative: NEGATIVE
Hyaline Casts, UA: NONE SEEN
Ketones UA: NEGATIVE
NITRITE: POSITIVE — AB
Protein, UA: NEGATIVE
RBC UA: NONE SEEN (ref 0–2)
Specific Gravity, UA: 1.006 (ref 1.001–1.035)
pH: 5.5 (ref 5.0–8.0)

## 2014-03-19 LAB — COMPREHENSIVE METABOLIC PANEL
ALT: 10 U/L (ref 6–29)
AST (SGOT): 14 U/L (ref 10–35)
Albumin/Globulin Ratio: 1.1 (ref 1.0–2.5)
Albumin: 3.7 G/DL (ref 3.6–5.1)
Alkaline Phosphatase: 58 U/L (ref 33–130)
BUN / Creatinine Ratio: 11.3 (ref 6–22)
BUN: 6 MG/DL — ABNORMAL LOW (ref 7–25)
Bilirubin, Total: 0.3 MG/DL (ref 0.2–1.2)
CO2: 27 mmol/L (ref 19–30)
Calcium: 9 MG/DL (ref 8.6–10.4)
Chloride: 104 mmol/L (ref 98–110)
Creatinine: 0.53 mg/dL (ref 0.50–1.05)
EGFR African American: 120 mL/min/{1.73_m2} (ref >=60)
EGFR: 104 mL/min/{1.73_m2} (ref >=60)
Globulin: 3.3 G/DL (ref 1.9–3.7)
Glucose: 86 MG/DL (ref 65–99)
Potassium: 4.2 mmol/L (ref 3.5–5.3)
Protein, Total: 7 G/DL (ref 6.1–8.1)
Sodium: 141 mmol/L (ref 135–146)

## 2014-03-19 LAB — CBC AND DIFFERENTIAL
Atypical Lymphocytes %: 0 %
Baso(Absolute): 75 cells/uL (ref 0–200)
Basophils: 1 %
Eosinophils Absolute: 240 cells/uL (ref 15–500)
Eosinophils: 3.2 %
Hematocrit: 41.3 % (ref 35.0–45.0)
Hemoglobin: 13.3 g/dL (ref 11.7–15.5)
Lymphocytes Absolute: 2693 cells/uL (ref 850–3900)
Lymphocytes: 35.9 %
MCH: 29.1 pg (ref 27–33)
MCHC: 32.2 g/dL (ref 32–36)
MCV: 91 fL (ref 80–100)
MPV: 7.3 fL — ABNORMAL LOW (ref 7.5–11.5)
Monocytes Absolute: 353 cells/uL (ref 200–950)
Monocytes: 4.7 %
Neutrophils Absolute: 4140 cells/uL (ref 1500–7800)
Neutrophils: 55.2 %
Platelets: 300 10*3/uL (ref 140–400)
RBC: 4.56 10*6/uL (ref 3.80–5.10)
RDW: 13.8 % (ref 11.0–15.0)
WBC: 7.5 10*3/uL (ref 3.8–10.8)

## 2014-03-19 LAB — URINE CULTURE

## 2014-03-24 ENCOUNTER — Telehealth: Payer: Self-pay

## 2014-03-24 ENCOUNTER — Other Ambulatory Visit: Payer: Self-pay

## 2014-03-24 MED ORDER — AMOXICILLIN-POT CLAVULANATE 875-125 MG PO TABS
1.0000 | ORAL_TABLET | Freq: Two times a day (BID) | ORAL | Status: AC
Start: 2014-03-24 — End: 2014-04-03

## 2014-03-24 NOTE — Telephone Encounter (Signed)
03/24/14 1600 Patient called office-Dr.Maser spoke directly to patient-change in plan-treat UTI with Augmentin 875mg  BID for 10 days per Dr.Maser,patient states she has had this RX and had no abnormal reaction, confirmed pharmacy info on file and RX sent=also informed patient of EMB needed in office-all questions answered, appt scheduled for 3 weeks with Dr.Maser-LN,LPN

## 2014-03-24 NOTE — Telephone Encounter (Signed)
03/24/14 1505 Dr.Maser reviewed all test results, labs/sonogram-plan: call patient and check on current symptoms, confirm if GI appt has been scheduled, check with patient if she has had Macrobid or Macrodantin in past since Levaquin is resistant to [facility] on urine, if no reaction prescribe Macrobid 100mg  BID for 7 days, schedule appt for EMB in office with NO cytotec, patient has thickened endometrium=25mm. Called patient to review test results and plan of care, received voicemail and left message for patient to please call back-LN,LPN

## 2014-03-25 ENCOUNTER — Telehealth: Payer: Self-pay

## 2014-03-25 NOTE — Telephone Encounter (Signed)
03/25/14 1450 Patient called office c/o possible reaction to antibiotic Augmentin-patient took first dose today at 1200 and is c/o bad head ache and nausea without vomiting, patient states she is currently in [place] on business. Dr.Maser spoke directly to patient on phone and advised patient due to HX and infection to go to closest ED in [place]-referrals given to be treated for Ecoli infection and nausea, lab results emailed to patient today to take with her, out of town pharmacy provided by patient just in case [facility] [phone]-LN,LPN

## 2014-03-27 ENCOUNTER — Encounter: Payer: Self-pay | Admitting: Obstetrics & Gynecology

## 2014-03-27 LAB — US PELVIS WITH TRANSVAGINAL

## 2014-04-16 ENCOUNTER — Telehealth: Payer: Self-pay

## 2014-04-16 NOTE — Telephone Encounter (Signed)
04/16/14 1055 Spoke with patient today regarding upcoming appt for EMB with Dr.Maser on 04/22/14 at 1520, reviewed pre-procedure instructions, per MD no cytotec necessary, patient confirmed this appt today-LN,LPN

## 2014-04-22 ENCOUNTER — Encounter: Payer: Self-pay | Admitting: Obstetrics & Gynecology

## 2014-04-22 ENCOUNTER — Ambulatory Visit: Payer: BLUE CROSS/BLUE SHIELD | Admitting: Obstetrics & Gynecology

## 2014-04-22 VITALS — BP 132/70 | HR 80 | Temp 98.4°F | Wt 190.0 lb

## 2014-04-22 DIAGNOSIS — R9389 Abnormal findings on diagnostic imaging of other specified body structures: Secondary | ICD-10-CM

## 2014-04-22 DIAGNOSIS — R938 Abnormal findings on diagnostic imaging of other specified body structures: Secondary | ICD-10-CM

## 2014-04-22 NOTE — Progress Notes (Signed)
Date of service 04/22/2014    Patient complains of midline pelvic pain which radiates to posteriorly with intercourse. The patient had a UTI   > 100,000 E. Coli treated with relief of left sided pain.    Crohn's disease flare in the last week. Patient has not made an appointment with her GI- Dr Cinda [facility]. She was in bed for the last week.  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

## 2014-04-22 NOTE — Progress Notes (Signed)
Common GYN tests  Last Pap Date: 05/21/13  Last Pap Result: Normal (-HPV)  Last Mammo Date: 02/22/12  Last Mammo Result: Normal (Birads 01 -[facility])  Last Colonoscopy Date: 01/24/13  Last Colonoscopy Result: Normal (Dr Balba)  Last Dexa Date: 02/22/12  Last Dexa Result: Normal (T score 0.6)

## 2014-04-28 LAB — TISSUE EXAM

## 2014-06-30 ENCOUNTER — Encounter: Payer: BLUE CROSS/BLUE SHIELD | Admitting: Obstetrics & Gynecology

## 2014-08-28 ENCOUNTER — Other Ambulatory Visit: Payer: Self-pay

## 2014-08-28 ENCOUNTER — Telehealth: Payer: Self-pay

## 2014-08-28 DIAGNOSIS — Z1239 Encounter for other screening for malignant neoplasm of breast: Secondary | ICD-10-CM

## 2014-08-28 NOTE — Telephone Encounter (Signed)
08/28/14 1429 Spoke to patient. Dr. Natasha Mead approved for early screening mammogram with 3D prior to April 2016 annual. Sent to patient's e-mail [email or web address]. [place], LPN

## 2014-09-02 ENCOUNTER — Encounter: Payer: Self-pay | Admitting: Obstetrics & Gynecology

## 2014-09-29 ENCOUNTER — Encounter: Payer: Self-pay | Admitting: Obstetrics & Gynecology

## 2014-09-29 ENCOUNTER — Ambulatory Visit: Payer: BLUE CROSS/BLUE SHIELD | Admitting: Obstetrics & Gynecology

## 2014-09-29 VITALS — BP 122/74 | HR 68 | Temp 98.3°F | Ht 61.0 in | Wt 197.0 lb

## 2014-09-29 DIAGNOSIS — Z01419 Encounter for gynecological examination (general) (routine) without abnormal findings: Secondary | ICD-10-CM

## 2014-09-29 DIAGNOSIS — Z1151 Encounter for screening for human papillomavirus (HPV): Secondary | ICD-10-CM

## 2014-09-29 DIAGNOSIS — Z124 Encounter for screening for malignant neoplasm of cervix: Secondary | ICD-10-CM

## 2014-09-29 DIAGNOSIS — Z126 Encounter for screening for malignant neoplasm of bladder: Secondary | ICD-10-CM

## 2014-09-29 NOTE — Progress Notes (Signed)
Common GYN tests  Last Pap Date: 05/21/13  Last Pap Result: Normal (HPV NEGATIVE)  Last Mammo Date: 09/25/14  Last Mammo Result: Normal  Mammo Results: (!) Birads 2  Last Colonoscopy Date: 01/24/13  Last Colonoscopy Result: Normal (Dr [facility])  Last Dexa Date: 02/22/12 (HX INFLAMMATORY BOWL DISEASE)  Last Dexa Result: Normal
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
## 2014-09-30 LAB — URINALYSIS WITH MICROSCOPIC
Bilirubin, UA: NEGATIVE
Blood, UA: NEGATIVE
Glucose Qualitative: NEGATIVE
Hyaline Casts, UA: NONE SEEN
Ketones UA: NEGATIVE
Leukocyte Esterase, UA: NEGATIVE
NITRITE: NEGATIVE
Protein, UA: NEGATIVE
RBC UA: NONE SEEN (ref 0–2)
Specific Gravity, UA: 1.003 (ref 1.001–1.035)
Squam Epithel, UA: NONE SEEN (ref 0–5)
Urine Bacteria: NONE SEEN
WBC: NONE SEEN (ref 0–5)
pH: 6 (ref 5.0–8.0)

## 2014-10-01 ENCOUNTER — Encounter: Payer: Self-pay | Admitting: Obstetrics & Gynecology

## 2014-10-02 LAB — THINPREP IMAGING PAP & HPV MRNA E6/E7.

## 2014-10-02 LAB — HPV MRNA E6-E7: HPV RNA, High Risk, E6-E7, TMA: NOT DETECTED

## 2014-10-06 ENCOUNTER — Telehealth: Payer: Self-pay

## 2014-10-06 NOTE — Telephone Encounter (Signed)
10/06/2014 1426. Attempted to contact pt regarding normal 09/29/2014 pap smear and negative HPV results. Reached voicemail. Left message asking pt to call the office back at [phone], to receive results. -AF, LPN

## 2014-10-06 NOTE — Telephone Encounter (Signed)
-----   Message from Almeta Monas, LPN sent at 1/61/0960  8:31 AM EDT -----  Regarding: normal pap  Please call patient with normal pap results-thanks  ----- Message -----     From: Interface, [facility] Results In     Sent: 09/30/2014   3:10 AM       To: Lajoyce Corners, MD

## 2015-01-24 ENCOUNTER — Encounter: Payer: Self-pay | Admitting: Obstetrics & Gynecology

## 2015-05-07 ENCOUNTER — Emergency Department: Payer: BLUE CROSS/BLUE SHIELD

## 2015-05-07 ENCOUNTER — Emergency Department
Admission: EM | Admit: 2015-05-07 | Discharge: 2015-05-07 | Disposition: A | Discharge status: Home / Self Care | Payer: BLUE CROSS/BLUE SHIELD | Attending: Emergency Medicine | Admitting: Emergency Medicine

## 2015-05-07 DIAGNOSIS — J45909 Unspecified asthma, uncomplicated: Secondary | ICD-10-CM | POA: Insufficient documentation

## 2015-05-07 DIAGNOSIS — I341 Nonrheumatic mitral (valve) prolapse: Secondary | ICD-10-CM | POA: Insufficient documentation

## 2015-05-07 DIAGNOSIS — F419 Anxiety disorder, unspecified: Secondary | ICD-10-CM | POA: Insufficient documentation

## 2015-05-07 HISTORY — DX: Insomnia, unspecified: G47.00

## 2015-05-07 HISTORY — DX: Anxiety disorder, unspecified: F41.9

## 2015-05-07 LAB — BASIC METABOLIC PANEL
BUN: 9 mg/dL (ref 7.0–19.0)
CO2: 24 mEq/L (ref 22–29)
Calcium: 9.6 mg/dL (ref 8.5–10.5)
Chloride: 109 mEq/L (ref 100–111)
Creatinine: 0.6 mg/dL (ref 0.6–1.0)
Glucose: 103 mg/dL — ABNORMAL HIGH (ref 70–100)
Potassium: 4.1 mEq/L (ref 3.5–5.1)
Sodium: 144 mEq/L (ref 136–145)

## 2015-05-07 LAB — CBC AND DIFFERENTIAL
Basophils Absolute Automated: 0.03 10*3/uL (ref 0.00–0.20)
Basophils Automated: 0 %
Eosinophils Absolute Automated: 0.25 10*3/uL (ref 0.00–0.70)
Eosinophils Automated: 3 %
Hematocrit: 41.8 % (ref 37.0–47.0)
Hgb: 14.1 g/dL (ref 12.0–16.0)
Immature Granulocytes Absolute: 0.02 10*3/uL
Immature Granulocytes: 0 %
Lymphocytes Absolute Automated: 2.73 10*3/uL (ref 0.50–4.40)
Lymphocytes Automated: 34 %
MCH: 30.1 pg (ref 28.0–32.0)
MCHC: 33.7 g/dL (ref 32.0–36.0)
MCV: 89.1 fL (ref 80.0–100.0)
MPV: 9.2 fL — ABNORMAL LOW (ref 9.4–12.3)
Monocytes Absolute Automated: 0.5 10*3/uL (ref 0.00–1.20)
Monocytes: 6 %
Neutrophils Absolute: 4.57 10*3/uL (ref 1.80–8.10)
Neutrophils: 56 %
Nucleated RBC: 0 /100 WBC (ref 0–1)
Platelets: 202 10*3/uL (ref 140–400)
RBC: 4.69 10*6/uL (ref 4.20–5.40)
RDW: 13 % (ref 12–15)
WBC: 8.1 10*3/uL (ref 3.50–10.80)

## 2015-05-07 LAB — ECG 12-LEAD
Atrial Rate: 79 {beats}/min
P Axis: -19 degrees
P-R Interval: 158 ms
Q-T Interval: 374 ms
QRS Duration: 84 ms
QTC Calculation (Bezet): 428 ms
R Axis: 51 degrees
T Axis: 0 degrees
Ventricular Rate: 79 {beats}/min

## 2015-05-07 LAB — TSH: TSH: 1.99 u[IU]/mL (ref 0.35–4.94)

## 2015-05-07 LAB — I-STAT TROPONIN: i-STAT Troponin: 0 ng/mL (ref 0.00–0.09)

## 2015-05-07 LAB — GFR: EGFR: >60

## 2015-05-07 MED ORDER — SODIUM CHLORIDE 0.9 % IV BOLUS
1000.0000 mL | Freq: Once | INTRAVENOUS | Status: AC
Start: 2015-05-07 — End: 2015-05-07
  Administered 2015-05-07: 1000 mL via INTRAVENOUS

## 2015-05-07 MED ORDER — LORAZEPAM 2 MG/ML IJ SOLN
1.0000 mg | Freq: Once | INTRAMUSCULAR | Status: AC
Start: 2015-05-07 — End: 2015-05-07
  Administered 2015-05-07: 1 mg via INTRAVENOUS
  Filled 2015-05-07: qty 1

## 2015-05-07 MED ORDER — ZOLPIDEM TARTRATE 10 MG PO TABS
10.0000 mg | ORAL_TABLET | Freq: Every evening | ORAL | Status: DC | PRN
Start: 2015-05-07 — End: 2017-11-08

## 2015-05-07 NOTE — ED Provider Notes (Signed)
[facility] EMERGENCY DEPARTMENT H&P      Visit date: 05/07/2015      CLINICAL SUMMARY          Diagnosis:    .     Final diagnoses:   Anxiety         Disposition:          ED Disposition     Discharge Carron Mcmurry Garro discharge to home/self care.    Condition at disposition: Stable                       CLINICAL INFORMATION        HPI:      Chief Complaint: Palpitations  .    Chelsea Montoya is a 59 y.o. female  has a past medical history of Crohn's disease; Anxiety; and Insomnia. who presents with sudden onset, moderate palpitations s/p waking up from a nightmare onset 1:13AM this evening. Pt notes she was asleep when she woke up from a nightmare, waited around 15-20 mins, however stated the nightmare would not go away. Immediately after, pt started developing severe palpitations, described as heart racing and pounding, pt calmed self down however notes sx are still present.     Pt reports having anxiety attacks previously for past 6 years.   Pt has also been having a lot of insomnia.  She was seen at PMD office on Monday, where she changed her Ambien to Belsomra - has been using this x 2 days.     Denies n/v/d, f/c, paresthesia, rash, sob, ha. No other complaints.      History obtained from: Patient          ROS:      Positive and negative ROS elements as per HPI.  All other systems reviewed and negative.      Physical Exam:      Pulse 83  BP (!) 179/98 mmHg  Resp 22  SpO2 98 %  Temp 98.3 F (36.8 C)    Constitutional: Vital signs reviewed. Anxious appearing.  Head: Normocephalic, atraumatic  Eyes:  EOMI. No conjunctival injection. No discharge.  ENT: Mucous membranes moist, no drooling or trismus  Neck: Normal range of motion. Supple, no meningismus  Respiratory/Chest: Clear to auscultation. No respiratory distress.   Cardiovascular: Regular rate and rhythm. No significant murmur appreciated. Equal radial pulses.  Abdomen: Soft and non-tender. No guarding. No peritoneal  signs  UpperExtremity: Normal ROM, no deformity.  LowerExtremity: Normal ROM, no deformity. No edema.Trace pedal edema b/l. No calf tenderness b/l.   Neurological:  GCS 15.  Speech normal. Moves extremities equally. No facial droop.   Skin: Warm and dry. No rash.  Psychiatric: Anxious affect. Normal concentration.               PAST HISTORY        Primary Care Provider: Dante Gang, MD        PMH/PSH:    .     Past Medical History   Diagnosis Date   . Crohn's disease      remission - colonoscopy    . Pyelonephritis    . Crohn's colitis 01/31/2012   . Complication of anesthesia      wake up fast   . Arrhythmia    . Mitral valve prolapse    . Asthma without status asthmaticus      allergy-induced only   . Pneumonia    . Shortness of breath  R/T allergy   . Seizures      30 years ago R/T stress   . Kidney stone    . Anemia      resolved   . Blood transfusion without reported diagnosis      14 years ago   . Vision abnormalities    . Arthritis      osteoarthritis   . Anxiety    . Insomnia        She has past surgical history that includes Appendectomy; Gynecologic cryosurgery; Colectomy (1988); Bunionectomy; Knee Replacement x 2; CLOSED REDUCTION, PERCUTANEOUS FIXATION, FINGER (10/28/2012); left knee replacement (05/20/2012); left ring finger (09/2012); and Colonoscopy w/ biopsies (2014).      Social/Family History:      She reports that she has never smoked. She has never used smokeless tobacco. She reports that she drinks alcohol. She reports that she does not use illicit drugs.    Family History   Problem Relation Age of Onset   . Uterine cancer Maternal Grandmother 50     survivior   . Breast cancer Paternal Grandmother    . Ovarian cancer Paternal Aunt 60     died of complications of ovarian ca   . Lung cancer Mother      complications of lung ca   . Kidney failure Father    . Colon cancer Neg Hx    . Ovarian cancer Paternal Aunt      survivor         Listed Medications on Arrival:    .     Home Medications      Last Medication Reconciliation Action:  In Progress Reffell, Dahlia Client, RN 05/07/2015  2:57 AM                  ALBUTEROL IN     Inhale into the lungs as needed.     Diphenoxylate-Atropine (LOMOTIL PO)     Take by mouth as needed.     Fluticasone-Salmeterol (ADVAIR HFA IN)     Inhale into the lungs.     mometasone (NASONEX) 50 MCG/ACT nasal spray     2 sprays by Nasal route as needed.     PERCOCET 5-325 MG per tablet          pseudoephedrine (SUDAFED) 30 MG tablet     Take 30 mg by mouth every 4 (four) hours as needed.     tiotropium (SPIRIVA) 18 MCG inhalation capsule     Place 18 mcg into inhaler and inhale daily.     XANAX 0.25 MG tablet          Zolpidem Tartrate (AMBIEN PO)     Take by mouth nightly.         Allergies: She is allergic to augmentin; darvon; keflex; ciprofloxacin; and sulfa antibiotics.            VISIT INFORMATION        Clinical Course in the ED:      Assessment & Plan:  59 y.o.  female presents with palpitations s/p nightmare.    Differential: anxiety, panic, med side affect, acs, arhythmia, electrolyte abnormality, thyroid abnormality, dehydration, pna    Plan:   Check x-ray, ekg  Check labs  Give anxiolytics  Give IVF bolus  Reviewed drug info on new medication Belsomra - known side effect is "abnormal dreams"  - suspect this side effect likely lead to an episode of anxiety for the patient  Reassess and dispo after treatment/results  5:39 AM  Work up negative  Pt feels improved after ativan  Suspect anxiety related to side effect of new medication  Recommend stop this new insomnia medication. Short Rx for [employer] provided.  PCP f/u 1-2 days.  Rest, fluids.  Pt understands and happy with plan, well-appearing and NAD at time of [place].        Medications Given in the ED:    .     ED Medication Orders     Start Ordered     Status Ordering Provider    05/07/15 0453 05/07/15 0452  LORazepam (ATIVAN) injection 1 mg   Once     Route: Intravenous  Ordered Dose: 1 mg     Last MAR action:  Given Rennis Petty [facility]    05/07/15 0322 05/07/15 0321  sodium chloride 0.9 % bolus 1,000 mL   Once     Route: Intravenous  Ordered Dose: 1,000 mL     Last [facility] action:  New Bag Rennis Petty [facility]    05/07/15 0322 05/07/15 0321  LORazepam (ATIVAN) injection 1 mg   Once     Route: Intravenous  Ordered Dose: 1 mg     Last MAR action:  Given [facility] SHAI[facility]            Procedures:      Procedures      Interpretations:      O2 sat-           saturation: 98 %; Oxygen use: room air; Interpretation: Normal      Radiology -     interpreted by me with the following observations: chest XR: no acute disease    EKG -             interpreted by me: NSR rate 79. No st elevations. T wave inversion lead III and v3. T wave flattening AVF. Normal intervals.     Monitor -         interpreted by me: normal sinus at 70s.                 RESULTS        Lab Results:      Results     Procedure Component Value Units Date/Time    CBC with differential [[id number]]  (Abnormal) Collected:  05/07/15 0336    Specimen Information:  Blood from Blood Updated:  05/07/15 0520     WBC 8.10 x10 3/uL      Hgb 14.1 g/dL      Hematocrit 40.9 %      Platelets 202 x10 3/uL      RBC 4.69 x10 6/uL      MCV 89.1 fL      MCH 30.1 pg      MCHC 33.7 g/dL      RDW 13 %      MPV 9.2 (L) fL      Neutrophils 56 %      Lymphocytes Automated 34 %      Monocytes 6 %      Eosinophils Automated 3 %      Basophils Automated 0 %      Immature Granulocyte 0 %      Nucleated RBC 0 /100 WBC      Neutrophils Absolute 4.57 x10 3/uL      Abs Lymph Automated 2.73 x10 3/uL      Abs Mono Automated 0.50 x10 3/uL      Abs Eos Automated 0.25 x10  3/uL      Absolute Baso Automated 0.03 x10 3/uL      Absolute Immature Granulocyte 0.02 x10 3/uL     TSH [[id number]] Collected:  05/07/15 0336    Specimen Information:  Blood Updated:  05/07/15 0448     Thyroid Stimulating Hormone 1.99 uIU/mL     Basic Metabolic Panel [[id number]]  (Abnormal) Collected:  05/07/15 0336    Specimen Information:  Blood  Updated:  05/07/15 0415     Glucose 103 (H) mg/dL      BUN 9.0 mg/dL      Creatinine 0.6 mg/dL      Calcium 9.6 mg/dL      Sodium 782 mEq/L      Potassium 4.1 mEq/L      Chloride 109 mEq/L      CO2 24 mEq/L     GFR [[id number]] Collected:  05/07/15 0336     EGFR >60.0 Updated:  05/07/15 0415    i-Stat Troponin [[id number]] Collected:  05/07/15 0341     i-STAT Troponin 0.00 ng/mL Updated:  05/07/15 0413              Radiology Results:      XR Chest  AP Portable   Final Result           Hypoinflated lungs without acute cardiopulmonary finding.               Demetrios Isaacs, MD    05/07/2015 5:08 AM                     Scribe Attestation:      Attestations:  I was acting as a Neurosurgeon for Lenny Pastel, MD on [facility]   Treatment Team: Scribe: Ivery Quale     I am the first provider for this patient and I personally performed the services documented. Treatment Team: Scribe: Ivery Quale is scribing for me on [facility]. This note accurately reflects work and decisions made by me.   Lenny Pastel, MD         Lenny Pastel, MD  05/12/15 [phone]

## 2015-05-07 NOTE — Discharge Instructions (Signed)
Dear Chelsea Montoya:    Thank you for choosing the [facility] Emergency Department, the premier emergency department in the [place] area.  I hope your visit today was EXCELLENT.    Specific instructions for your visit today:  Your tests in the ER are normal. We suspect symptoms are related to anxiety from side effects of your new sleeping medication. Please stop taking this medication. Use Ambien as prescribed if needed to help sleep until you get new prescription. Don't drive after taking prescription med. Get plenty of rest and fluids.     Anxiety, Panic    You have been diagnosed with an anxiety attack.    You seem to have had an anxiety attack. There are many conditions that can cause symptoms like these. If this is the first time this has happened, follow-up with your regular doctor. You may need more testing to be sure there isn't another cause for your symptoms.    Anxiety causes very strong feelings of worry and fear. It may also cause chest pain or shortness of breath. You may feel like you have palpitations (a racing heart). You might feel numbness (like parts of your body are "asleep"), especially around the mouth and in the hands or feet.    Follow up with your counselor and family doctor. If you do not have an appointment in the next 2-3 days, call and make one. It is VERY IMPORTANT for your counselor and family doctor to know if you get worse.    YOU SHOULD SEEK MEDICAL ATTENTION IMMEDIATELY, EITHER HERE OR AT THE NEAREST EMERGENCY DEPARTMENT, IF ANY OF THE FOLLOWING OCCURS:   You have symptoms that you normally don't have with your anxiety attacks.   You think of harming yourself (suicidal thoughts) or harming someone else.   You have symptoms you normally don't have and they last longer than normal or your medicine doesn t help. These include chest pain, passing out, feeling that your heart is racing or shortness of breath.   You have a fever (temperature higher than 100.48F /  38C).      Sedating Medication (Edu)    You have been given a medicine or prescription for medicine that causes drowsiness (sleepiness) or dizziness. While taking this medicine, DO NOT drive a car. DO NOT operate machinery. DO NOT perform jobs where you need to be alert.    DO NOT drink alcoholic beverages while taking this medicine.    If you get dizzy, sit or lie down at the first signs. Be careful going up and down stairs.    Never give this medicine to others.    Keep this medicine out of reach of children.    Do not take or save old medicines. Throw them away when outdated.    Keep all medicines in a cool, dry place. DO NOT keep them in your bathroom medicine cabinet or in a cabinet above the stove.    If you do not continue to improve or your condition worsens, please contact your doctor or return immediately to the Emergency Department.    Sincerely,  Dr. Kathi Ludwig  Attending Emergency Physician  [facility] Emergency Department    ONSITE PHARMACY  Our full service onsite pharmacy is located in the ER waiting room.  Open 7 days a week from 9 am to 11 pm.  We accept all major insurances and prices are competitive with major retailers.  Ask your provider to print your prescriptions down to the  pharmacy to speed you on your way home.    OBTAINING A PRIMARY CARE APPOINTMENT    Primary care physicians (PCPs, also known as primary care doctors) are either internists or family medicine doctors. Both types of PCPs focus on health promotion, disease prevention, patient education and counseling, and treatment of acute and chronic medical conditions.    Call for an appointment with a primary care doctor.  Ask to see who is taking new patients.     [facility]  telephone:  [phone]  [email or web address]    DOCTOR REFERRALS  Call [phone] (available 24 hours a day, 7 days a week) if you need any further referrals and we can help you find a primary care doctor or specialist.  Also,  available online at:  [email or web address]    YOUR CONTACT INFORMATION  Before leaving please check with registration to make sure we have an up-to-date contact number.  You can call registration at [phone] to update your information.  For questions about your hospital bill, please call [phone].  For questions about your Emergency Dept Physician bill please call [phone].      FREE HEALTH SERVICES  If you need help with health or social services, please call 2-1-1 for a free referral to resources in your area.  2-1-1 is a free service connecting people with information on health insurance, free clinics, pregnancy, mental health, dental care, food assistance, housing, and substance abuse counseling.  Also, available online at:  http://www.[facility].org    MEDICAL RECORDS AND TESTS  Certain laboratory test results do not come back the same day, for example urine cultures.   We will contact you if other important findings are noted.  Radiology films are often reviewed again to ensure accuracy.  If there is any discrepancy, we will notify you.      Please call [phone] to pick up a complimentary CD of any radiology studies performed.  If you or your doctor would like to request a copy of your medical records, please call [phone].      ORTHOPEDIC INJURY   Please know that significant injuries can exist even when an initial x-ray is read as normal or negative.  This can occur because some fractures (broken bones) are not initially visible on x-rays.  For this reason, close outpatient follow-up with your primary care doctor or bone specialist (orthopedist) is required.    MEDICATIONS AND FOLLOWUP  Please be aware that some prescription medications can cause drowsiness.  Use caution when driving or operating machinery.    The examination and treatment you have received in our Emergency Department is provided on an emergency basis, and is not intended to be a substitute for your  primary care physician.  It is important that your doctor checks you again and that you report any new or remaining problems at that time.      24 HOUR PHARMACIES  The nearest 24 hour pharmacy is:    [facility]  [address]  [place], [place] [address]  [phone]      ASSISTANCE WITH INSURANCE    Affordable Care Act  [facility])  Call to start or finish an application, compare plans, enroll or ask a question.  [phone]  TTY: [phone]  Web:  Healthcare.gov    Help Enrolling in [facility]  [employer] [place]  269-236-9616 (TOLL-FREE)  [phone] (TTY)  Web:  Http://www.coverva.org    Local Help Enrolling in the [employer]  [place] [facility]  562-264-8302)  161-0960 (MAIN)  Email:  health-help@nvfs .org  Web:  BlackjackMyths.is  Address:  [address] [place], [place] [address]    SEDATING MEDICATIONS  Sedating medications include strong pain medications (e.g. narcotics), muscle relaxers, benzodiazepines (used for anxiety and as muscle relaxers), Benadryl/diphenhydramine and other antihistamines for allergic reactions/itching, and other medications.  If you are unsure if you have received a sedating medication, please ask your physician or nurse.  If you received a sedating medication: DO NOT drive a car. DO NOT operate machinery. DO NOT perform jobs where you need to be alert.  DO NOT drink alcoholic beverages while taking this medicine.     If you get dizzy, sit or lie down at the first signs. Be careful going up and down stairs.  Be extra careful to prevent falls.     Never give this medicine to others.     Keep this medicine out of reach of children.     Do not take or save old medicines. Throw them away when outdated.     Keep all medicines in a cool, dry place. DO NOT keep them in your bathroom medicine cabinet or in a cabinet above the stove.    MEDICATION REFILLS  Please be aware that we cannot refill any prescriptions through the ER. If you need further treatment from  what is provided at your ER visit, please follow up with your primary care doctor or your pain management specialist.    FREESTANDING EMERGENCY DEPARTMENTS OF [facility]  Did you know [facility] has two freestanding ERs located just a few miles away?  [facility] [place] and [facility] [place] have short wait times, easy free parking directly in front of the building and top patient satisfaction scores - and the same Board Certified Emergency Medicine doctors as [facility].              Chelsea Montoya  [id number]  [id number]  [phone]  05/07/2015    Discharge Instructions    As always, you are the most important factor in your recovery.  Please follow these instructions carefully.  If you have problems that we have not discussed, CALL OR VISIT YOUR DOCTOR RIGHT AWAY.     If you can't reach your doctor, return to the emergency department.    I Chelsea Montoya understand the written and discussed instructions.  My questions have been answered.  I acknowledge receipt of these instructions.     Patient or responsible person:         Patient's Signature               Physician or Nurse

## 2015-06-02 ENCOUNTER — Telehealth: Payer: Self-pay

## 2015-06-02 NOTE — Telephone Encounter (Signed)
06/02/2015 1737 Message left with patient 05/13/2015 to make follow up appointment with Dr. Natasha Mead to discuss pelvic pain past 2 months patient feels unrelated to Chron's. Pain more bothersome while sitting than standing. States Chron's is stable. History of UTI and kidney stones. Patient denies urinary symptoms and feels unrelated. Patient has history of prolapse and has pessary fitted by Dr. Hurshel Keys. Patient states uncomfortable and does not regularly wear pessary. Patient states Dr. Natasha Mead familiar with symptomology as this has been ongoing issue. As of today, patient has yet to make follow up appointment. 2nd message left on cell phone reminding patient to make appointment with Dr. Natasha Mead to discuss symptoms and treatment options. SD, LPN

## 2015-08-24 ENCOUNTER — Telehealth: Payer: Self-pay

## 2015-08-24 NOTE — Telephone Encounter (Signed)
08/24/15 1149 Patient called office requesting orders screening [facility] DEXA to be done before scheduled appt 09/14/15 with Dr.Maser-no concerns.  Discussed with Dr.Maser-verbal consent given, both orders mailed and emailed to patient as requested-LN,LPN

## 2015-09-02 ENCOUNTER — Encounter: Payer: Self-pay | Admitting: Obstetrics & Gynecology
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
## 2015-09-14 ENCOUNTER — Ambulatory Visit: Payer: BLUE CROSS/BLUE SHIELD | Admitting: Obstetrics & Gynecology

## 2015-09-14 ENCOUNTER — Encounter: Payer: Self-pay | Admitting: Obstetrics & Gynecology

## 2015-09-14 VITALS — BP 118/60 | HR 80 | Temp 97.5°F | Wt 189.0 lb

## 2015-09-14 DIAGNOSIS — Q625 Duplication of ureter: Secondary | ICD-10-CM

## 2015-09-14 DIAGNOSIS — Q638 Other specified congenital malformations of kidney: Secondary | ICD-10-CM

## 2015-09-14 DIAGNOSIS — Z1151 Encounter for screening for human papillomavirus (HPV): Secondary | ICD-10-CM

## 2015-09-14 DIAGNOSIS — M549 Dorsalgia, unspecified: Secondary | ICD-10-CM

## 2015-09-14 DIAGNOSIS — R1084 Generalized abdominal pain: Secondary | ICD-10-CM

## 2015-09-14 DIAGNOSIS — Z124 Encounter for screening for malignant neoplasm of cervix: Secondary | ICD-10-CM

## 2015-09-14 NOTE — Progress Notes (Signed)
Common GYN tests  Last Pap Date: 09/29/14  Last Pap Result: Normal (HPV negative)  Last Mammo Date: 09/25/14 [facility])  Last Mammo Result: Normal (Benign-appearing Ca++ in both breasts)  Mammo Results: (!) Birads 2  Last Colonoscopy Date: 01/24/13  Last Colonoscopy Result: Normal (Dr Cinda Quest RTN 2017)  Last Dexa Date: 02/22/12 [facility])  Last Dexa Result: Normal (All T-scores at or above -1.0)

## 2015-09-17 LAB — URINALYSIS WITH MICROSCOPIC
Bilirubin, UA: NEGATIVE
Blood, UA: NEGATIVE
Glucose Qualitative: NEGATIVE
Hyaline Casts, UA: NONE SEEN
Ketones UA: NEGATIVE
Leukocyte Esterase, UA: NEGATIVE
NITRITE: NEGATIVE
Protein, UA: NEGATIVE
RBC UA: NONE SEEN (ref 0–2)
Specific Gravity, UA: 1.01 (ref 1.001–1.035)
Squam Epithel, UA: NONE SEEN (ref 0–5)
Urine Bacteria: NONE SEEN
WBC: NONE SEEN (ref 0–5)
pH: 5.5 (ref 5.0–8.0)

## 2015-09-17 LAB — URINE CULTURE

## 2015-09-20 ENCOUNTER — Telehealth: Payer: Self-pay

## 2015-09-20 NOTE — Telephone Encounter (Signed)
09/20/15 1153 UAC&S negative. Called patient and discussed negative results by phone, no further questions-LN,LPN

## 2015-09-24 ENCOUNTER — Other Ambulatory Visit: Payer: Self-pay | Admitting: Family Medicine

## 2015-09-27 ENCOUNTER — Other Ambulatory Visit: Payer: Self-pay | Admitting: Family Medicine

## 2015-09-27 ENCOUNTER — Other Ambulatory Visit: Payer: Self-pay | Admitting: Obstetrics & Gynecology

## 2015-09-28 ENCOUNTER — Encounter: Payer: Self-pay | Admitting: Obstetrics & Gynecology

## 2015-09-28 ENCOUNTER — Telehealth: Payer: Self-pay

## 2015-09-28 NOTE — Telephone Encounter (Signed)
09/28/15 1300 DEXA [facility] 09/27/15 -Normal. Called patient and left detailed message on personal cell with normal results, advised to continue weight bearing exercise, calcium with vitamin D supplements, questions to contact the office-LN,LPN
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
## 2015-10-04 ENCOUNTER — Encounter: Payer: Self-pay | Admitting: Obstetrics & Gynecology

## 2015-10-04 ENCOUNTER — Ambulatory Visit: Payer: BLUE CROSS/BLUE SHIELD | Admitting: Obstetrics & Gynecology

## 2015-10-04 VITALS — BP 118/70 | HR 72 | Temp 98.2°F | Ht 61.5 in | Wt 187.4 lb

## 2015-10-04 DIAGNOSIS — K50119 Crohn's disease of large intestine with unspecified complications: Secondary | ICD-10-CM

## 2015-10-04 DIAGNOSIS — G8929 Other chronic pain: Secondary | ICD-10-CM

## 2015-10-04 DIAGNOSIS — R109 Unspecified abdominal pain: Secondary | ICD-10-CM

## 2015-10-04 DIAGNOSIS — Q625 Duplication of ureter: Secondary | ICD-10-CM

## 2015-10-04 DIAGNOSIS — Z124 Encounter for screening for malignant neoplasm of cervix: Secondary | ICD-10-CM

## 2015-10-04 DIAGNOSIS — Q638 Other specified congenital malformations of kidney: Secondary | ICD-10-CM

## 2015-10-04 DIAGNOSIS — Z01411 Encounter for gynecological examination (general) (routine) with abnormal findings: Secondary | ICD-10-CM

## 2015-10-04 DIAGNOSIS — Z1151 Encounter for screening for human papillomavirus (HPV): Secondary | ICD-10-CM

## 2015-10-04 DIAGNOSIS — Z126 Encounter for screening for malignant neoplasm of bladder: Secondary | ICD-10-CM

## 2015-10-04 NOTE — Progress Notes (Signed)
Common GYN tests  Last Pap Date: 09/29/14  Last Pap Result: Normal (HPV negative)  Last Mammo Date: 09/27/15 [facility])  Last Mammo Result: Normal (Benign-appearing Ca++ in both breasts)  Mammo Results: (!) Birads 2  Last Colonoscopy Date: 01/24/13 (PLAN IS TO SCHED FOR 2017 PER PT)  Last Colonoscopy Result: Normal (Dr Cinda Quest RTN 2017)  Last Dexa Date: 09/27/15 [facility])  Last Dexa Result: Normal

## 2015-10-05 LAB — URINALYSIS WITH MICROSCOPIC
Bilirubin, UA: NEGATIVE
Blood, UA: NEGATIVE
Glucose Qualitative: NEGATIVE
Hyaline Casts, UA: NONE SEEN
Ketones UA: NEGATIVE
Leukocyte Esterase, UA: NEGATIVE
NITRITE: NEGATIVE
Protein, UA: NEGATIVE
RBC UA: NONE SEEN (ref 0–2)
Specific Gravity, UA: 1.009 (ref 1.001–1.035)
Squam Epithel, UA: NONE SEEN (ref 0–5)
Urine Bacteria: NONE SEEN
WBC: NONE SEEN (ref 0–5)
pH: 5.5 (ref 5.0–8.0)

## 2015-10-09 LAB — THINPREP IMAGING PAP & HPV MRNA E6/E7.

## 2015-10-09 LAB — HPV MRNA E6-E7: HPV RNA, High Risk, E6-E7, TMA: NOT DETECTED

## 2015-10-11 ENCOUNTER — Encounter: Payer: Self-pay | Admitting: Obstetrics & Gynecology

## 2015-10-12 ENCOUNTER — Ambulatory Visit: Payer: BLUE CROSS/BLUE SHIELD | Admitting: Obstetrics & Gynecology

## 2015-11-25 ENCOUNTER — Ambulatory Visit (INDEPENDENT_AMBULATORY_CARE_PROVIDER_SITE_OTHER): Payer: Self-pay | Admitting: Cardiovascular Disease

## 2015-11-26 ENCOUNTER — Other Ambulatory Visit: Payer: Self-pay | Admitting: Cardiovascular Disease

## 2015-11-26 DIAGNOSIS — R011 Cardiac murmur, unspecified: Secondary | ICD-10-CM

## 2015-12-02 ENCOUNTER — Other Ambulatory Visit: Payer: Self-pay | Admitting: Neurology

## 2015-12-02 ENCOUNTER — Encounter: Payer: Self-pay | Admitting: Obstetrics & Gynecology

## 2015-12-02 DIAGNOSIS — M542 Cervicalgia: Secondary | ICD-10-CM

## 2015-12-03 ENCOUNTER — Ambulatory Visit: Payer: BLUE CROSS/BLUE SHIELD

## 2015-12-03 ENCOUNTER — Ambulatory Visit
Admission: RE | Admit: 2015-12-03 | Discharge: 2015-12-03 | Disposition: A | Discharge status: Home / Self Care | Payer: BLUE CROSS/BLUE SHIELD | Source: Ambulatory Visit | Attending: Cardiovascular Disease | Admitting: Cardiovascular Disease

## 2015-12-03 DIAGNOSIS — I517 Cardiomegaly: Secondary | ICD-10-CM | POA: Insufficient documentation

## 2015-12-03 DIAGNOSIS — M542 Cervicalgia: Secondary | ICD-10-CM

## 2015-12-03 DIAGNOSIS — I358 Other nonrheumatic aortic valve disorders: Secondary | ICD-10-CM | POA: Insufficient documentation

## 2015-12-03 DIAGNOSIS — R011 Cardiac murmur, unspecified: Secondary | ICD-10-CM

## 2015-12-07 ENCOUNTER — Encounter: Payer: Self-pay | Admitting: Obstetrics & Gynecology

## 2015-12-08 ENCOUNTER — Encounter: Payer: Self-pay | Admitting: Obstetrics & Gynecology

## 2015-12-08 ENCOUNTER — Ambulatory Visit (INDEPENDENT_AMBULATORY_CARE_PROVIDER_SITE_OTHER): Payer: Self-pay

## 2015-12-20 ENCOUNTER — Other Ambulatory Visit: Payer: Self-pay | Admitting: Cardiovascular Disease

## 2015-12-20 DIAGNOSIS — R079 Chest pain, unspecified: Secondary | ICD-10-CM

## 2015-12-24 ENCOUNTER — Ambulatory Visit (INDEPENDENT_AMBULATORY_CARE_PROVIDER_SITE_OTHER): Payer: Self-pay | Admitting: Cardiology

## 2015-12-24 ENCOUNTER — Ambulatory Visit: Payer: BLUE CROSS/BLUE SHIELD

## 2015-12-24 ENCOUNTER — Inpatient Hospital Stay
Admission: RE | Admit: 2015-12-24 | Discharge: 2015-12-24 | Disposition: A | Discharge status: Home / Self Care | Payer: BLUE CROSS/BLUE SHIELD | Source: Ambulatory Visit | Attending: Cardiovascular Disease | Admitting: Cardiovascular Disease

## 2015-12-24 DIAGNOSIS — R079 Chest pain, unspecified: Secondary | ICD-10-CM | POA: Insufficient documentation

## 2015-12-24 DIAGNOSIS — R9439 Abnormal result of other cardiovascular function study: Secondary | ICD-10-CM | POA: Insufficient documentation

## 2015-12-24 MED ORDER — TECHNETIUM TC 99M TETROFOSMIN INJECTION
1.0000 | Freq: Once | Status: AC | PRN
Start: 2015-12-24 — End: 2015-12-24
  Administered 2015-12-24: 1 via INTRAVENOUS
  Filled 2015-12-24: qty 1

## 2015-12-29 ENCOUNTER — Encounter: Payer: Self-pay | Admitting: Obstetrics & Gynecology

## 2015-12-30 ENCOUNTER — Ambulatory Visit (INDEPENDENT_AMBULATORY_CARE_PROVIDER_SITE_OTHER): Payer: Self-pay

## 2016-01-17 ENCOUNTER — Ambulatory Visit (INDEPENDENT_AMBULATORY_CARE_PROVIDER_SITE_OTHER): Payer: Self-pay | Admitting: Family

## 2016-01-18 ENCOUNTER — Encounter: Payer: Self-pay | Admitting: Obstetrics & Gynecology

## 2016-01-28 ENCOUNTER — Other Ambulatory Visit: Payer: Self-pay | Admitting: Gastroenterology

## 2016-04-10 ENCOUNTER — Encounter: Payer: BLUE CROSS/BLUE SHIELD | Admitting: Obstetrics & Gynecology

## 2016-04-25 ENCOUNTER — Encounter: Payer: BLUE CROSS/BLUE SHIELD | Admitting: Obstetrics & Gynecology

## 2016-05-25 ENCOUNTER — Encounter: Payer: BLUE CROSS/BLUE SHIELD | Admitting: Obstetrics & Gynecology

## 2016-05-31 ENCOUNTER — Encounter: Payer: Self-pay | Admitting: Obstetrics & Gynecology

## 2016-05-31 ENCOUNTER — Ambulatory Visit: Payer: BLUE CROSS/BLUE SHIELD | Admitting: Obstetrics & Gynecology

## 2016-05-31 VITALS — BP 148/80 | HR 80 | Temp 98.5°F | Wt 190.8 lb

## 2016-05-31 DIAGNOSIS — N813 Complete uterovaginal prolapse: Secondary | ICD-10-CM

## 2016-05-31 NOTE — Progress Notes (Signed)
Common GYN tests  Last Pap Date: 10/04/15  Last Pap Result: Normal (HPV negative)  Last Mammo Date: 09/27/15 [facility])  Last Mammo Result: Normal (Benign-appearing Ca++ in both breasts)  Mammo Results: (!) Birads 2  Last Colonoscopy Date: 02/25/16 (DR. Marge Duncans  - RTN IN 2020)  Last Colonoscopy Result: (!) Abnormal (1 POLYP, INT HEMORRHOIDS, HX CROHNS IN REMISSION)  Last Dexa Date: 09/27/15 ([facility])  Last Dexa Result: Normal

## 2016-06-01 ENCOUNTER — Ambulatory Visit (INDEPENDENT_AMBULATORY_CARE_PROVIDER_SITE_OTHER): Payer: Self-pay | Admitting: Cardiovascular Disease
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
## 2016-10-04 ENCOUNTER — Ambulatory Visit: Payer: BLUE CROSS/BLUE SHIELD | Admitting: Obstetrics & Gynecology

## 2016-10-19 ENCOUNTER — Ambulatory Visit: Payer: BLUE CROSS/BLUE SHIELD | Admitting: Obstetrics & Gynecology

## 2016-11-01 ENCOUNTER — Encounter: Payer: Self-pay | Admitting: Obstetrics & Gynecology

## 2016-11-01 ENCOUNTER — Ambulatory Visit: Payer: BLUE CROSS/BLUE SHIELD | Admitting: Obstetrics & Gynecology

## 2016-11-01 VITALS — BP 120/70 | HR 85 | Temp 98.6°F | Ht 61.0 in | Wt 200.0 lb

## 2016-11-01 DIAGNOSIS — Z01411 Encounter for gynecological examination (general) (routine) with abnormal findings: Secondary | ICD-10-CM

## 2016-11-01 DIAGNOSIS — Z1231 Encounter for screening mammogram for malignant neoplasm of breast: Secondary | ICD-10-CM

## 2016-11-01 DIAGNOSIS — G8929 Other chronic pain: Secondary | ICD-10-CM

## 2016-11-01 DIAGNOSIS — Z124 Encounter for screening for malignant neoplasm of cervix: Secondary | ICD-10-CM

## 2016-11-01 DIAGNOSIS — R109 Unspecified abdominal pain: Secondary | ICD-10-CM

## 2016-11-01 DIAGNOSIS — Z126 Encounter for screening for malignant neoplasm of bladder: Secondary | ICD-10-CM

## 2016-11-01 DIAGNOSIS — N813 Complete uterovaginal prolapse: Secondary | ICD-10-CM

## 2016-11-01 DIAGNOSIS — Z1151 Encounter for screening for human papillomavirus (HPV): Secondary | ICD-10-CM

## 2016-11-01 DIAGNOSIS — Q625 Duplication of ureter: Secondary | ICD-10-CM

## 2016-11-01 DIAGNOSIS — Z1239 Encounter for other screening for malignant neoplasm of breast: Secondary | ICD-10-CM
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
## 2016-11-01 NOTE — Progress Notes (Signed)
Common GYN tests  Last Pap Date: 10/04/15  Last Pap Result: Normal (HPV negative)  Last Mammo Date: 09/27/15 [facility])  Last Mammo Result: Normal (Benign-appearing Ca++ in both breasts)  Mammo Results: (!) Birads 2  Last Colonoscopy Date: 02/25/16 (DR. Marge Duncans  - RTN IN 2020)  Last Colonoscopy Result: (!) Abnormal (1 POLYP, INT HEMORRHOIDS, HX CROHNS IN REMISSION)  Last Dexa Date: 09/27/15 ([facility])  Last Dexa Result: Normal

## 2016-11-02 LAB — THINPREP IMAGING PAP & HPV MRNA E6/E7.
HPV mRNA E6/E7: NOT DETECTED
Pap Interpretation/Result: NEGATIVE

## 2016-11-03 ENCOUNTER — Telehealth: Payer: Self-pay

## 2016-11-03 LAB — URINALYSIS REFLEX TO MICROSCOPIC EXAM - REFLEX TO CULTURE
Bilirubin, UA: NEGATIVE
Blood, UA: NEGATIVE
Glucose Qualitative: NEGATIVE
Hyaline Casts, UA: NONE SEEN /LPF
Ketones UA: NEGATIVE
Nitrites: NEGATIVE
Protein, UA: NEGATIVE
RBC UA: NONE SEEN /HPF (ref <=2)
Specific Gravity, UA: 1.007 (ref 1.001–1.035)
Urine Bacteria: NONE SEEN /HPF
pH: 6.5 (ref 5.0–8.0)

## 2016-11-03 LAB — REFLEXIVE URINE CULTURE

## 2016-11-03 LAB — URINE CULTURE

## 2016-11-03 NOTE — Telephone Encounter (Signed)
11/03/16 1157 Informed patient of negative pap smear HPV Urine culture results. Patient verbalized understanding-LF,LPN

## 2016-11-21 ENCOUNTER — Other Ambulatory Visit: Payer: Self-pay | Admitting: Orthopaedic Surgery

## 2016-11-21 ENCOUNTER — Ambulatory Visit: Payer: BLUE CROSS/BLUE SHIELD | Attending: Orthopaedic Surgery

## 2016-11-21 DIAGNOSIS — M47896 Other spondylosis, lumbar region: Secondary | ICD-10-CM | POA: Insufficient documentation

## 2016-11-21 DIAGNOSIS — M48061 Spinal stenosis, lumbar region without neurogenic claudication: Secondary | ICD-10-CM | POA: Insufficient documentation

## 2016-11-21 DIAGNOSIS — M545 Low back pain: Secondary | ICD-10-CM

## 2016-11-21 DIAGNOSIS — M2578 Osteophyte, vertebrae: Secondary | ICD-10-CM | POA: Insufficient documentation

## 2017-07-23 ENCOUNTER — Other Ambulatory Visit: Payer: Self-pay | Admitting: Orthopaedic Surgery

## 2017-07-23 DIAGNOSIS — M4800 Spinal stenosis, site unspecified: Secondary | ICD-10-CM

## 2017-07-24 ENCOUNTER — Other Ambulatory Visit: Payer: Self-pay | Admitting: Orthopaedic Surgery

## 2017-07-24 ENCOUNTER — Ambulatory Visit: Payer: BLUE CROSS/BLUE SHIELD | Attending: Orthopaedic Surgery

## 2017-07-24 DIAGNOSIS — M4800 Spinal stenosis, site unspecified: Secondary | ICD-10-CM

## 2017-07-24 DIAGNOSIS — M47812 Spondylosis without myelopathy or radiculopathy, cervical region: Secondary | ICD-10-CM | POA: Insufficient documentation

## 2017-07-24 DIAGNOSIS — M4802 Spinal stenosis, cervical region: Secondary | ICD-10-CM

## 2017-07-24 DIAGNOSIS — M5031 Other cervical disc degeneration,  high cervical region: Secondary | ICD-10-CM | POA: Insufficient documentation

## 2017-07-24 DIAGNOSIS — M2578 Osteophyte, vertebrae: Secondary | ICD-10-CM | POA: Insufficient documentation

## 2017-09-25 ENCOUNTER — Other Ambulatory Visit: Payer: Self-pay | Admitting: Gastroenterology

## 2017-09-25 DIAGNOSIS — R197 Diarrhea, unspecified: Secondary | ICD-10-CM

## 2017-09-26 ENCOUNTER — Other Ambulatory Visit: Payer: Self-pay | Admitting: Gastroenterology

## 2017-09-26 ENCOUNTER — Ambulatory Visit: Payer: BLUE CROSS/BLUE SHIELD | Attending: Gastroenterology

## 2017-09-26 DIAGNOSIS — K50919 Crohn's disease, unspecified, with unspecified complications: Secondary | ICD-10-CM

## 2017-09-26 DIAGNOSIS — R197 Diarrhea, unspecified: Secondary | ICD-10-CM

## 2017-09-26 DIAGNOSIS — K5090 Crohn's disease, unspecified, without complications: Secondary | ICD-10-CM | POA: Insufficient documentation

## 2017-09-26 DIAGNOSIS — R1084 Generalized abdominal pain: Secondary | ICD-10-CM

## 2017-09-26 DIAGNOSIS — D259 Leiomyoma of uterus, unspecified: Secondary | ICD-10-CM | POA: Insufficient documentation

## 2017-09-26 MED ORDER — GLUCAGON 1 MG IJ SOLR (WRAP)
1.00 mg | Freq: Once | INTRAMUSCULAR | Status: AC
Start: 2017-09-26 — End: ?

## 2017-09-26 MED ORDER — GADOBUTROL 1 MMOL/ML IV SOLN
10.00 mL | Freq: Once | INTRAVENOUS | Status: AC | PRN
Start: 2017-09-26 — End: 2017-09-26
  Administered 2017-09-26: 11:00:00 10 mmol via INTRAVENOUS
  Filled 2017-09-26: qty 10

## 2017-09-26 MED ORDER — BREEZA FLAVORED BEVERAGE
1000.00 mL | Freq: Once | ORAL | Status: AC | PRN
Start: 2017-09-26 — End: 2017-09-26
  Administered 2017-09-26: 11:00:00 1000 mL via ORAL
  Filled 2017-09-26: qty 1000

## 2017-10-20 ENCOUNTER — Emergency Department
Admission: EM | Admit: 2017-10-20 | Discharge: 2017-10-20 | Disposition: A | Discharge status: Home / Self Care | Payer: BLUE CROSS/BLUE SHIELD | Attending: Emergency Medicine | Admitting: Emergency Medicine

## 2017-10-20 DIAGNOSIS — J302 Other seasonal allergic rhinitis: Secondary | ICD-10-CM | POA: Insufficient documentation

## 2017-10-20 DIAGNOSIS — Z79899 Other long term (current) drug therapy: Secondary | ICD-10-CM | POA: Insufficient documentation

## 2017-10-20 DIAGNOSIS — R040 Epistaxis: Secondary | ICD-10-CM

## 2017-10-20 NOTE — ED Provider Notes (Signed)
[facility] EMERGENCY DEPARTMENT H&P      Visit date: 10/20/2017      CLINICAL SUMMARY           Diagnosis:    .     Final diagnoses:   Epistaxis         MDM Notes:      Significant epistaxis by history, but bleeding resolved by presentation here.  She was observed for some time without further bleeding.  No indication for packing or other intervention at this time.  Her seasonal allergies may be contributing to this presentation and she will continue to manage these.  Given the substantial amount of bleeding, she will follow-up with ENT.  Return precautions, supportive care, follow-up reviewed.           Disposition:         Discharge         Discharge Prescriptions     None                      CLINICAL INFORMATION        HPI:      Chief Complaint: Epistaxis  .    Chelsea Montoya is a 62 y.o. female who  has a past medical history of Anemia; Anxiety; Arrhythmia; Asthma without status asthmaticus; Crohn's colitis; Duplicated right renal collecting system; HTN; Mitral valve prolapse; Pneumonia; Thyroid nodule;    who presents to the ED for eval of moderate epistaxis for today. Was at home and had bent over to pick up some towels, when she began feeling that her nose was running. When she cleaned in with a tissue, pt found copious amounts of blood on tissue and on shirt. Bleeding in LT>RT with small "fiber-like clots".  No history of abnormal bleeding.    Takes decongestant and Claritin everyday. Uses albuterol and inhaler PRN.   Denies cough and SOB.     History obtained from: Patient          ROS:      Positive and negative ROS elements as per HPI.  All other systems reviewed and negative.      Physical Exam:      Pulse 83  BP 140/76  Resp 18  SpO2 96 %  Temp 97.9 F (36.6 C)    Constitutional: vital signs reviewed, well appearing, no apparent distress; shirt has several bloodstains on  Eyes: no conjunctival injection  Head: normocephalic, atraumatic  ENT: No visible bleeding in  the anterior nares.  No oropharynx blood.  No likely source of bleeding identified.  There is a slight deviation of the septum toward the right approximately 1 cm into the nose.  Cardiovascular: normal rate  Respiratory: no respiratory distress  GI: -----  GU: -----  Musculoskeletal:     Neck: normal movement, no swelling     Back: -----     Upper extremities: normal movement     Lower extremities: -----  Integ/Skin: warm, dry  Neuro: Alert, speech normal  Psych: normal affect, good insight              PAST HISTORY        Primary Care Provider: Dante Gang, MD        PMH/PSH:    .     Past Medical History:   Diagnosis Date   . Anemia     resolved   . Anxiety    . Arrhythmia    . Arthritis  osteoarthritis   . Asthma without status asthmaticus     allergy-induced only   . Blood transfusion without reported diagnosis     14 years ago   . Complication of anesthesia     wake up fast   . Crohn's colitis 01/31/2012   . Crohn's disease     remission - colonoscopy    . Duplicated right renal collecting system    . Hypertension    . Insomnia    . Kidney stone    . Mitral valve prolapse    . Pneumonia    . Pyelonephritis    . Seizures     30 years ago R/T stress   . Shortness of breath     R/T allergy   . Thyroid nodule     BILATERAL 09/2014; RT LOBE THYROID NODULE BX - BENIGN   . Vision abnormalities        She has a past surgical history that includes Appendectomy; Gynecologic cryosurgery; Colectomy (1988); Bunionectomy; Knee Replacement x 2; CLOSED REDUCTION, PERCUTANEOUS FIXATION, FINGER (10/28/2012); left knee replacement (05/20/2012); left ring finger (09/2012); Colonoscopy w/ biopsies (02/2016); Endometrial biopsy (04/22/2014); THYROID NODULE BIOPSY (09/2014); and Right Knee Replacement (Right, 10/28/2014).      Social/Family History:      She reports that she has never smoked. She has never used smokeless tobacco. She reports that she drinks alcohol. She reports that she does not use drugs.    Family History   Problem  Relation Age of Onset   . Ovarian cancer Paternal Aunt 60        died of complications of ovarian ca   . Lung cancer Mother         complications of lung ca   . Kidney failure Father    . Ovarian cancer Paternal Aunt         survivor   . Uterine cancer Maternal Grandmother 50        survivior   . Breast cancer Paternal Grandmother    . Colon cancer Neg Hx          Listed Medications on Arrival:    .     Home Medications     Med List Status:  In Progress Set By: Renard Hamper, RN at 10/20/2017 10:35 AM                ALPRAZolam (XANAX) 0.5 MG tablet     Take by mouth.     buPROPion XL (WELLBUTRIN XL) 150 MG 24 hr tablet     TAKE ONE TABLET BY MOUTH EVERY MORNING     dicyclomine (BENTYL) 20 MG tablet     TAKE ONE TABLET BY MOUTH THREE TIMES A DAY AS NEEDED     Loratadine-Pseudoephedrine (CLARITIN-D 12 HOUR PO)     Take by mouth.     oxyCODONE-acetaminophen (PERCOCET) 5-325 MG per tablet     Take by mouth.     WELLBUTRIN XL 300 MG 24 hr tablet     TAKE ONE TABLET BY MOUTH EVERY MORNING     XANAX 0.5 MG tablet     TAKE ONE TABLET BY MOUTH TWICE A DAY AS NEEDED     zolpidem (AMBIEN) 10 MG tablet     Take 1 tablet (10 mg total) by mouth nightly as needed for Sleep.     zolpidem (AMBIEN) 10 MG tablet     Take by mouth.         Allergies: She is allergic  to flexeril [cyclobenzaprine]; augmentin [amoxicillin-pot clavulanate]; darvon; keflex [cephalexin]; latex; ciprofloxacin; and sulfa antibiotics.            VISIT INFORMATION        Clinical Course in the ED:                 Medications Given in the ED:    .     ED Medication Orders     None            Procedures:      Procedures      Interpretations:                    RESULTS        Lab Results:      Results     ** No results found for the last 24 hours. **              Radiology Results:      No orders to display               Scribe Attestation:      I, Ransom Nickson, Ed Blalock, MD, personally performed the services documented.  Shalini Boddu is scribing for me on  [place] [address]. I reviewed and confirm the accuracy of the information in this medical record.     I, Shalini Boddu, am serving as a Neurosurgeon to document services personally performed by Pura Spice, MD based on the provider's statements to me.             Pura Spice, MD  10/20/17 1910

## 2017-10-20 NOTE — Discharge Instructions (Signed)
Dear Ms. Gaultney:    Thank you for choosing the [facility] Emergency Department, the premier emergency department in the [place] area.  I hope your visit today was EXCELLENT.    Specific instructions for your visit today:    Return to ER for further bleeding that does not stop with pressure, facial pain, or for other concerning symptoms.    Follow-up with the ENT specialist recommended above in the next week.    Your doctor today was Dr. Bennett Scrape.  Thank you for the opportunity to care for you.    If you have difficulty finding a physician for follow-up, please call [phone] ([phone]) for assistance.       24 hour pharmacy [facility]    [facility] a day.    [facility]  [address]  [place], [place] [address]  [phone]    [facility] [place]        Epistaxis    You have been seen for Epistaxis (bloody nose).    Epistaxis in the medical term for a bloody nose. Epistaxis is a common condition. While it may be scary, it is not often serious. The skin in the front of the nose is very fragile. In children, it is most often damaged by direct trauma (nose-picking) or when the skin is very dry or irritated (like in dry environments on when you have a cold).    Bleeding often starts at the end of the nose. Therefore, pinching the entire nose for 15 minutes often helps stop the bleeding. When the bleeding area can be found, the doctor may use a chemical to stop the bleeding. This chemical is called Silver Nitrate. Other chemicals are sometimes used to stop the bleeding. These chemicals cause the oozing blood to clot. You may have had one or both of these treatments today. If chemical treatment doesn't stop the bleeding, packing may be placed into the nose. Packing can be uncomfortable. However, it often really works at stopping a bloody nose.    Sometimes bleeding starts from a blood vessel way in the back of the nose or in the throat. This  type of bleeding is hard to control. Nasal packing by an Ear, Nose and Throat specialist is often needed.    Putting ice on the forehead or back of the neck is not effective. It WILL NOT stop the bleeding. Instead, press or squeeze the nose directly. If bleeding doesn't stop in 15 minutes, come back here or go to the nearest Emergency Department.    Do not use aspirin or non-steroidal anti-inflammatory medicines like ibuprofen (Advil or Motrin), naproxen (Naprosyn or Aleve), etc. Also stay away from alcohol. These make it hard for your blood to clot.    Avoid blowing your nose, sneezing and straining for the next few days.    YOU SHOULD SEEK MEDICAL ATTENTION IMMEDIATELY, EITHER HERE OR AT THE NEAREST EMERGENCY DEPARTMENT, IF ANY OF THE FOLLOWING OCCURS:   Bleeding doesn't stop with direct pressure.   Bleeding down the back of the throat.   The packing gets out of place.                   If you do not continue to improve or your condition worsens, please contact your doctor or return immediately to the Emergency Department.    Sincerely,  Miner, Ed Blalock, MD  Attending Emergency Physician  [facility] Emergency Department  ONSITE PHARMACY  Our full service onsite pharmacy is located in the ER waiting room.  Open 7 days a week from 9 am to 9 pm.  We accept all major insurances and prices are competitive with major retailers.  Ask your provider to print your prescriptions down to the pharmacy to speed you on your way home.    OBTAINING A PRIMARY CARE APPOINTMENT    Primary care physicians (PCPs, also known as primary care doctors) are either internists or family medicine doctors. Both types of PCPs focus on health promotion, disease prevention, patient education and counseling, and treatment of acute and chronic medical conditions.    Call for an appointment with a primary care doctor.  Ask to see who is taking new patients.     [facility]  telephone:   [phone]  [email or web address]    DOCTOR REFERRALS  Call [phone] (available 24 hours a day, 7 days a week) if you need any further referrals and we can help you find a primary care doctor or specialist.  Also, available online at:  [email or web address]    YOUR CONTACT INFORMATION  Before leaving please check with registration to make sure we have an up-to-date contact number.  You can call registration at [phone] to update your information.  For questions about your hospital bill, please call [phone].  For questions about your Emergency Dept Physician bill please call [phone].      FREE HEALTH SERVICES  If you need help with health or social services, please call 2-1-1 for a free referral to resources in your area.  2-1-1 is a free service connecting people with information on health insurance, free clinics, pregnancy, mental health, dental care, food assistance, housing, and substance abuse counseling.  Also, available online at:  http://www.[place].org    MEDICAL RECORDS AND TESTS  Certain laboratory test results do not come back the same day, for example urine cultures.   We will contact you if other important findings are noted.  Radiology films are often reviewed again to ensure accuracy.  If there is any discrepancy, we will notify you.      Please call [phone] to pick up a complimentary CD of any radiology studies performed.  If you or your doctor would like to request a copy of your medical records, please call [phone].      ORTHOPEDIC INJURY   Please know that significant injuries can exist even when an initial x-ray is read as normal or negative.  This can occur because some fractures (broken bones) are not initially visible on x-rays.  For this reason, close outpatient follow-up with your primary care doctor or bone specialist (orthopedist) is required.    MEDICATIONS AND FOLLOWUP  Please be aware that some prescription medications can cause  drowsiness.  Use caution when driving or operating machinery.    The examination and treatment you have received in our Emergency Department is provided on an emergency basis, and is not intended to be a substitute for your primary care physician.  It is important that your doctor checks you again and that you report any new or remaining problems at that time.      24 HOUR PHARMACIES  The nearest 24 hour pharmacy is:    [facility]  [address]  [place], [place] [address]  [phone]      ASSISTANCE WITH INSURANCE    Affordable Care Act  [facility])  Call to start or finish an application, compare plans, enroll  or ask a question.  [phone]  TTY: [phone]  Web:  Healthcare.gov    Help Enrolling in [facility]  [employer] [place]  979-727-4050 (TOLL-FREE)  [phone] (TTY)  Web:  Http://www.coverva.org    Local Help Enrolling in the [employer]  [place] [facility]  641-636-2339 (MAIN)  Email:  health-help@nvfs .org  Web:  BlackjackMyths.is  Address:  [address], [address] [place], [place] [address]    SEDATING MEDICATIONS  Sedating medications include strong pain medications (e.g. narcotics), muscle relaxers, benzodiazepines (used for anxiety and as muscle relaxers), Benadryl/diphenhydramine and other antihistamines for allergic reactions/itching, and other medications.  If you are unsure if you have received a sedating medication, please ask your physician or nurse.  If you received a sedating medication: DO NOT drive a car. DO NOT operate machinery. DO NOT perform jobs where you need to be alert.  DO NOT drink alcoholic beverages while taking this medicine.     If you get dizzy, sit or lie down at the first signs. Be careful going up and down stairs.  Be extra careful to prevent falls.     Never give this medicine to others.     Keep this medicine out of reach of children.     Do not take or save old medicines. Throw them away when outdated.     Keep all medicines in a cool,  dry place. DO NOT keep them in your bathroom medicine cabinet or in a cabinet above the stove.    MEDICATION REFILLS  Please be aware that we cannot refill any prescriptions through the ER. If you need further treatment from what is provided at your ER visit, please follow up with your primary care doctor or your pain management specialist.    FREESTANDING EMERGENCY DEPARTMENTS OF [facility]  Did you know [facility] has two freestanding ERs located just a few miles away?  [facility] [place] and [facility] [place] have short wait times, easy free parking directly in front of the building and top patient satisfaction scores - and the same Board Certified Emergency Medicine doctors as [facility].

## 2017-11-08 ENCOUNTER — Encounter: Payer: Self-pay | Admitting: Obstetrics & Gynecology

## 2017-11-08 ENCOUNTER — Ambulatory Visit: Payer: BLUE CROSS/BLUE SHIELD | Admitting: Obstetrics & Gynecology

## 2017-11-08 VITALS — BP 124/76 | HR 82 | Temp 98.7°F | Ht 60.0 in | Wt 213.0 lb

## 2017-11-08 DIAGNOSIS — N813 Complete uterovaginal prolapse: Secondary | ICD-10-CM

## 2017-11-08 DIAGNOSIS — Z1231 Encounter for screening mammogram for malignant neoplasm of breast: Secondary | ICD-10-CM

## 2017-11-08 DIAGNOSIS — Z1239 Encounter for other screening for malignant neoplasm of breast: Secondary | ICD-10-CM

## 2017-11-08 DIAGNOSIS — Z01419 Encounter for gynecological examination (general) (routine) without abnormal findings: Secondary | ICD-10-CM

## 2017-11-08 DIAGNOSIS — Z124 Encounter for screening for malignant neoplasm of cervix: Secondary | ICD-10-CM

## 2017-11-08 DIAGNOSIS — Z1151 Encounter for screening for human papillomavirus (HPV): Secondary | ICD-10-CM

## 2017-11-08 DIAGNOSIS — Z126 Encounter for screening for malignant neoplasm of bladder: Secondary | ICD-10-CM

## 2017-11-08 DIAGNOSIS — Q625 Duplication of ureter: Secondary | ICD-10-CM
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
## 2017-11-08 NOTE — Addendum Note (Signed)
Addended by: Jerry Caras on: 11/08/2017 11:36 AM     Modules accepted: Orders

## 2017-11-13 ENCOUNTER — Other Ambulatory Visit: Payer: Self-pay | Admitting: Obstetrics & Gynecology

## 2017-11-14 LAB — URINALYSIS REFLEX TO MICROSCOPIC EXAM - REFLEX TO CULTURE
Bilirubin, UA: NEGATIVE
Blood, UA: NEGATIVE
Glucose Qualitative: NEGATIVE
Hyaline Casts, UA: NONE SEEN /LPF
Ketones UA: NEGATIVE
Leukocyte Esterase, UA: NEGATIVE
Nitrites: NEGATIVE
Protein, UA: NEGATIVE
RBC UA: NONE SEEN /HPF (ref <=2)
Specific Gravity, UA: 1.005 (ref 1.001–1.035)
Squam Epithel, UA: NONE SEEN /HPF (ref <=5)
Urine Bacteria: NONE SEEN /HPF
WBC, UA: NONE SEEN /HPF (ref <=5)
pH: 6 (ref 5.0–8.0)

## 2017-11-14 LAB — THINPREP IMAGING PAP & HPV MRNA E6/E7.
HPV mRNA E6/E7: NOT DETECTED
Pap Interpretation/Result: NEGATIVE

## 2017-11-15 ENCOUNTER — Telehealth: Payer: Self-pay

## 2017-11-15 NOTE — Telephone Encounter (Signed)
11/15/17 1315 Informed patient of normal pap smear/HPV UA results by phone-LF,LPN

## 2017-11-20 ENCOUNTER — Telehealth: Payer: Self-pay

## 2017-11-20 NOTE — Telephone Encounter (Signed)
LVM, Birads 01 mammo. Repeat routine screening annually.       lw

## 2017-12-07 ENCOUNTER — Other Ambulatory Visit: Payer: Self-pay | Admitting: Orthopaedic Surgery

## 2017-12-07 ENCOUNTER — Other Ambulatory Visit: Payer: Self-pay | Admitting: Cardiovascular Disease

## 2017-12-07 ENCOUNTER — Ambulatory Visit (INDEPENDENT_AMBULATORY_CARE_PROVIDER_SITE_OTHER): Payer: Self-pay | Admitting: Cardiovascular Disease

## 2017-12-07 DIAGNOSIS — I5032 Chronic diastolic (congestive) heart failure: Secondary | ICD-10-CM

## 2017-12-11 ENCOUNTER — Ambulatory Visit
Admission: RE | Admit: 2017-12-11 | Discharge: 2017-12-11 | Disposition: A | Discharge status: Home / Self Care | Payer: BLUE CROSS/BLUE SHIELD | Source: Ambulatory Visit | Attending: Cardiovascular Disease | Admitting: Cardiovascular Disease

## 2017-12-11 DIAGNOSIS — I493 Ventricular premature depolarization: Secondary | ICD-10-CM | POA: Insufficient documentation

## 2017-12-11 DIAGNOSIS — Z01810 Encounter for preprocedural cardiovascular examination: Secondary | ICD-10-CM | POA: Insufficient documentation

## 2017-12-11 DIAGNOSIS — I5032 Chronic diastolic (congestive) heart failure: Secondary | ICD-10-CM

## 2017-12-11 DIAGNOSIS — E669 Obesity, unspecified: Secondary | ICD-10-CM | POA: Insufficient documentation

## 2017-12-11 DIAGNOSIS — R079 Chest pain, unspecified: Secondary | ICD-10-CM | POA: Insufficient documentation

## 2017-12-11 DIAGNOSIS — I351 Nonrheumatic aortic (valve) insufficiency: Secondary | ICD-10-CM | POA: Insufficient documentation

## 2017-12-12 ENCOUNTER — Encounter: Payer: Self-pay | Admitting: Obstetrics & Gynecology

## 2017-12-17 NOTE — Discharge Instr - AVS First Page (Signed)
Cervical Spine Fusion Surgery    Back in Action Spine Surgery booklet to be provided   and reviewed if not previously completed and documented.      Mixing alcohol and pain medications can cause dizziness and slow your breathing.    Don't drink alcoholic beverages while taking pain medications.       Steven S. Hughes, M.D.  [address], [address], [place], [place]   [address]  [address], [place], [place]   [address]  [phone] * Fax: [phone]        Home Care Cervical Discectomy with Fusion  The information below will help you to know what to expect after you leave the hospital. Please read and discuss any questions with Dr. Hughes, the Physician Assistant or the nurse.    Precautions:  Notify your doctor if any of the following occur (you will be given cell phone numbers for both Dr. Hughes and his nurse, before you leave the hospital)  . Problems with controlling bowel or bladder  . Problems with sensation or movement  . Any changes in your arms such as numbness, tingling, or pain  . Any of the following problems with your incision(s):  o Severe pain not relieved by pain medication  o Redness and swelling around incision area  o Incision area warm to touch  o Excessive or pus-like drainage from bandage  o Elevated temperature of 100.8F or 38.5C  o Difficulty swallowing  Please do not take any anti-inflammatory drugs until you have discussed this with Dr. Hughes or the Physician Assistant. You may take acetaminophen (Tylenol).      Brace Wear:  Unless instructed otherwise, the brace should be work for comfort only, typically you will find you need this towards the end of the day.  There is no need to sleep in the brace unless you are instructed by Dr. Hughes.  We want you to wean from the brace use by the end of two weeks in most cases.      DO NOT RESUME ANY BLOOD THINNERS (COUMADIN, PLAVIX OR ASPIRIN) BEFORE CHECKING WITH DR. HUGHES.  All other medications that you were taking for other medical  problems most likely can be resumed.  Check with Dr. Hughes if you have any questions regarding this.        It is quite normal to continue having pain in the area where your pain was before the operation.  This usually is due to the surgical procedure, and it should decrease in 3 to 4 weeks.    If you have had a fusion, the area where the bone was removed may ache or be tender for a few weeks. This, too, will decrease.    Increase your activity slowly, as you have done while in the hospital.  Plan to walk every day, increasing the distance, as you feel stronger, but check with your surgeon before participating in other activities.  Ask you surgeon when you may return to strenuous activity.  You can climb a flight of stairs several times a day if necessary.  You may ride in a care for short periods.  Wear your seat belt for more support.    Bandage:  You may remove your bandage as long as there is no drainage from the neck/hip area.  If there is drainage, the bandage should be changed daily.    Bath/Shower: You may shower three days after your surgery.  Do not use powder, lotion or creams   on or around the incision until ten days after the surgery.    Pain Medications (narcotics): Do not suddenly stop narcotic medications as you can have withdrawal symptoms. Gradually decrease usage over 2 weeks. Please contact us if you have any questions about this process.        GENERAL INFORMATION ON POST-OPERATIVE LIVING    Regardless of the type of operation you had, you will need a period of re-adapting to normal activities. You may experience periods of shoulder or neck pain and/or arm pain now and then, but the pain should not be as severe as it was before the operation.  The more active you are after your operation, the fewer problems you will have!    For the first few months, you should:  Restrict your lifting.  Once you start lifting things, use proper lifting techniques, as will be demonstrated by your physical  therapist.  Do not lift objects over 60 lbs.  Dr. Hughes will instruct you with specific lifting restrictions for your surgery.      Driving (based on your response to narcotics)  Your ability to drive will be determined at your first post-operative visit.  Call the doctor with questions regarding this.  Depending on the type of operation you had, you may be given the "OK" at 2 weeks.  At first, drive only for short distances and wear your support.  When you are given the "OK" to drive longer distances, you should stop every hour to walk around and gently stretch.  You should try to share the driving whenever possible.  Driving is also dependent upon the quality and type of pain medication you are using.          Foods  Patients should take in foods that are high in calcium and vitamin D - skim milk for example, as well as foods such as yogurt, cheese, bony fish, vegetables such as broccoli, even Japanese seaweed.  Avoid excess alcohol, coffee and tobacco, as these will decrease the rate and quality of bone formation in fusions.    Sports and Activities  There is no precise time frame for a return to activities.  Everything in this booklet is GENERAL, and you should check with your physician before returning to any activity.  Your physical therapist will also work closely with you for proper guidance and back-to-sports conditioning.    Garden Work  Discuss your specific gardening needs with your physician.  Most garden activities should not be done until AT LEAST 2 weeks after surgery.    Sexual Activity  Sexual relations may be resumed 1 week post-operatively in a side-lying or back-lying position.  Use common sense.  Ask your physician about your specific needs.  Do not be shy - this is a common concern for patients and the physician is used to such questions.    After Hours:  For emergencies: please go directly to your nearest emergency room  For general questions: please call [phone] and speak to the  On-Call Physician

## 2017-12-18 ENCOUNTER — Ambulatory Visit (INDEPENDENT_AMBULATORY_CARE_PROVIDER_SITE_OTHER): Payer: Self-pay | Admitting: Cardiology

## 2017-12-18 ENCOUNTER — Ambulatory Visit: Payer: BLUE CROSS/BLUE SHIELD | Attending: Orthopaedic Surgery

## 2017-12-18 ENCOUNTER — Encounter: Payer: Self-pay | Admitting: Obstetrics & Gynecology

## 2017-12-18 DIAGNOSIS — Z01818 Encounter for other preprocedural examination: Secondary | ICD-10-CM

## 2017-12-19 LAB — PRESURGICAL SURVEILLANCE, MSSA+MRSA: Culture Staph and MRSA Surveillance: POSITIVE — AB

## 2017-12-19 NOTE — Pre-Procedure Instructions (Signed)
+  mssa, -mrsa, faxed to surgeon office, emailed to surgical scheduler [place]

## 2017-12-21 ENCOUNTER — Other Ambulatory Visit: Payer: Self-pay | Admitting: Gastroenterology

## 2017-12-22 ENCOUNTER — Telehealth: Payer: BLUE CROSS/BLUE SHIELD

## 2017-12-22 NOTE — Pre-Procedure Instructions (Signed)
   ANESTHESIA GUIDELINES:     No testing per guidelines- for surgeon    SURGEON REQUIREMENT:     Per pt, surgeon requires med clear, cardiac clear, labs, ekg    SPECIALIST NOTES/TEST RESULT REQUESTS:   Clearances/LABS/ EKG- reviewed by Nav   RECENT HOSPITALIZATION/ED VISIT:    10/20/2017 ED- Epistaxis- in epic    EMAIL SENT to [email or web address] (map, chg, phone #'s, spine video)    OTHER/MISC:    Patient states she is hard stick   Latex- posting emailed   Pain- appropriate hospital staff emailed

## 2017-12-26 ENCOUNTER — Ambulatory Visit: Payer: Self-pay

## 2017-12-26 ENCOUNTER — Ambulatory Visit: Payer: BLUE CROSS/BLUE SHIELD | Admitting: Anesthesiology

## 2017-12-26 ENCOUNTER — Encounter
Admission: RE | Disposition: A | Discharge status: Home / Self Care | Payer: Self-pay | Source: Ambulatory Visit | Attending: Orthopaedic Surgery

## 2017-12-26 ENCOUNTER — Inpatient Hospital Stay
Admission: RE | Admit: 2017-12-26 | Discharge: 2017-12-28 | DRG: 472 | Disposition: A | Discharge status: Home / Self Care | Payer: BLUE CROSS/BLUE SHIELD | Source: Ambulatory Visit | Attending: Orthopaedic Surgery | Admitting: Orthopaedic Surgery

## 2017-12-26 ENCOUNTER — Ambulatory Visit: Payer: BLUE CROSS/BLUE SHIELD

## 2017-12-26 DIAGNOSIS — J45909 Unspecified asthma, uncomplicated: Secondary | ICD-10-CM | POA: Diagnosis present

## 2017-12-26 DIAGNOSIS — G839 Paralytic syndrome, unspecified: Secondary | ICD-10-CM | POA: Diagnosis not present

## 2017-12-26 DIAGNOSIS — M4802 Spinal stenosis, cervical region: Principal | ICD-10-CM | POA: Diagnosis present

## 2017-12-26 DIAGNOSIS — M5000 Cervical disc disorder with myelopathy, unspecified cervical region: Secondary | ICD-10-CM | POA: Diagnosis present

## 2017-12-26 DIAGNOSIS — E669 Obesity, unspecified: Secondary | ICD-10-CM | POA: Diagnosis present

## 2017-12-26 DIAGNOSIS — K5010 Crohn's disease of large intestine without complications: Secondary | ICD-10-CM | POA: Diagnosis present

## 2017-12-26 DIAGNOSIS — Z6841 Body Mass Index (BMI) 40.0 and over, adult: Secondary | ICD-10-CM

## 2017-12-26 DIAGNOSIS — M5001 Cervical disc disorder with myelopathy,  high cervical region: Secondary | ICD-10-CM | POA: Diagnosis present

## 2017-12-26 DIAGNOSIS — M5032 Other cervical disc degeneration, mid-cervical region, unspecified level: Secondary | ICD-10-CM

## 2017-12-26 DIAGNOSIS — Z7951 Long term (current) use of inhaled steroids: Secondary | ICD-10-CM

## 2017-12-26 DIAGNOSIS — I5032 Chronic diastolic (congestive) heart failure: Secondary | ICD-10-CM | POA: Diagnosis present

## 2017-12-26 DIAGNOSIS — M5021 Other cervical disc displacement,  high cervical region: Secondary | ICD-10-CM | POA: Diagnosis present

## 2017-12-26 HISTORY — DX: Cardiac murmur, unspecified: R01.1

## 2017-12-26 SURGERY — FUSION, ANTERIOR CERVICAL, LEVELS 3+
Anesthesia: Anesthesia General | Site: Spine Cervical | Wound class: Clean

## 2017-12-26 MED ORDER — DIPHENHYDRAMINE HCL 50 MG/ML IJ SOLN
6.2500 mg | INTRAMUSCULAR | Status: DC | PRN
Start: 2017-12-26 — End: 2017-12-26

## 2017-12-26 MED ORDER — SODIUM CHLORIDE 0.9 % IV SOLN
INTRAVENOUS | Status: DC
Start: 2017-12-26 — End: 2017-12-28

## 2017-12-26 MED ORDER — OXYCODONE-ACETAMINOPHEN 5-325 MG PO TABS
2.0000 | ORAL_TABLET | ORAL | Status: DC | PRN
Start: 2017-12-26 — End: 2017-12-28
  Administered 2017-12-26 – 2017-12-28 (×10): 2 via ORAL
  Filled 2017-12-26 (×10): qty 2

## 2017-12-26 MED ORDER — LACTATED RINGERS IV SOLN
INTRAVENOUS | Status: DC
Start: 2017-12-26 — End: 2017-12-28

## 2017-12-26 MED ORDER — PHENYLEPHRINE HCL 10 MG/ML IV SOLN (WRAP)
Status: DC | PRN
Start: 2017-12-26 — End: 2017-12-26
  Administered 2017-12-26: 10:00:00 20 ug/min via INTRAVENOUS

## 2017-12-26 MED ORDER — MIDAZOLAM HCL 2 MG/2ML IJ SOLN
INTRAMUSCULAR | Status: DC | PRN
Start: 2017-12-26 — End: 2017-12-26
  Administered 2017-12-26: 2 mg via INTRAVENOUS

## 2017-12-26 MED ORDER — ONDANSETRON 4 MG PO TBDP
4.0000 mg | ORAL_TABLET | Freq: Three times a day (TID) | ORAL | Status: DC | PRN
Start: 2017-12-26 — End: 2017-12-28

## 2017-12-26 MED ORDER — FAMOTIDINE 10 MG/ML IV SOLN (WRAP)
INTRAVENOUS | Status: DC | PRN
Start: 2017-12-26 — End: 2017-12-26
  Administered 2017-12-26: 20 mg via INTRAVENOUS

## 2017-12-26 MED ORDER — LIDOCAINE HCL 2 % IJ SOLN
INTRAMUSCULAR | Status: DC | PRN
Start: 2017-12-26 — End: 2017-12-26
  Administered 2017-12-26: 100 mg via INTRAVENOUS

## 2017-12-26 MED ORDER — FENTANYL CITRATE (PF) 50 MCG/ML IJ SOLN (WRAP)
INTRAMUSCULAR | Status: DC | PRN
Start: 2017-12-26 — End: 2017-12-26
  Administered 2017-12-26 (×2): 25 ug via INTRAVENOUS
  Administered 2017-12-26: 100 ug via INTRAVENOUS
  Administered 2017-12-26 (×2): 25 ug via INTRAVENOUS

## 2017-12-26 MED ORDER — LIDOCAINE HCL (PF) 2 % IJ SOLN
INTRAMUSCULAR | Status: AC
Start: 2017-12-26 — End: ?
  Filled 2017-12-26: qty 5

## 2017-12-26 MED ORDER — ROCURONIUM BROMIDE 10 MG/ML IV SOLN (WRAP)
INTRAVENOUS | Status: DC | PRN
Start: 2017-12-26 — End: 2017-12-26
  Administered 2017-12-26: 25 mg via INTRAVENOUS

## 2017-12-26 MED ORDER — BENZOCAINE-MENTHOL 15-3.6 MG MT LOZG
1.0000 | LOZENGE | OROMUCOSAL | Status: DC | PRN
Start: 2017-12-26 — End: 2017-12-28
  Administered 2017-12-28: 1 via BUCCAL
  Filled 2017-12-26: qty 1

## 2017-12-26 MED ORDER — PROPOFOL 10 MG/ML IV EMUL (WRAP)
INTRAVENOUS | Status: AC
Start: 2017-12-26 — End: ?
  Filled 2017-12-26: qty 50

## 2017-12-26 MED ORDER — GLYCOPYRROLATE 0.2 MG/ML IJ SOLN
INTRAMUSCULAR | Status: AC
Start: 2017-12-26 — End: ?
  Filled 2017-12-26: qty 1

## 2017-12-26 MED ORDER — FENTANYL CITRATE (PF) 50 MCG/ML IJ SOLN (WRAP)
25.0000 ug | INTRAMUSCULAR | Status: AC | PRN
Start: 2017-12-26 — End: 2017-12-26
  Administered 2017-12-26 (×3): 25 ug via INTRAVENOUS

## 2017-12-26 MED ORDER — ROCURONIUM BROMIDE 50 MG/5ML IV SOLN
INTRAVENOUS | Status: AC
Start: 2017-12-26 — End: ?
  Filled 2017-12-26: qty 5

## 2017-12-26 MED ORDER — HYDROMORPHONE HCL 1 MG/ML IJ SOLN
INTRAMUSCULAR | Status: AC
Start: 2017-12-26 — End: 2017-12-26
  Administered 2017-12-26: 14:00:00 0.5 mg via INTRAVENOUS
  Filled 2017-12-26: qty 1

## 2017-12-26 MED ORDER — ACETAMINOPHEN 500 MG PO TABS
ORAL_TABLET | ORAL | Status: AC
Start: 2017-12-26 — End: ?
  Filled 2017-12-26: qty 2

## 2017-12-26 MED ORDER — PROPOFOL 10 MG/ML IV EMUL (WRAP)
INTRAVENOUS | Status: DC | PRN
Start: 2017-12-26 — End: 2017-12-26
  Administered 2017-12-26: 150 mg via INTRAVENOUS

## 2017-12-26 MED ORDER — HYDROMORPHONE HCL 0.5 MG/0.5 ML IJ SOLN
0.5000 mg | INTRAMUSCULAR | Status: DC | PRN
Start: 2017-12-26 — End: 2017-12-26
  Administered 2017-12-26: 0.5 mg via INTRAVENOUS

## 2017-12-26 MED ORDER — GABAPENTIN 300 MG PO CAPS
300.0000 mg | ORAL_CAPSULE | Freq: Once | ORAL | Status: AC
Start: 2017-12-26 — End: 2017-12-26
  Administered 2017-12-26: 08:00:00 300 mg via ORAL

## 2017-12-26 MED ORDER — FLEET ENEMA 7-19 GM/118ML RE ENEM
1.0000 | ENEMA | Freq: Once | RECTAL | Status: DC | PRN
Start: 2017-12-26 — End: 2017-12-28

## 2017-12-26 MED ORDER — CLINDAMYCIN PHOSPHATE IN D5W 900 MG/50ML IV SOLN
900.0000 mg | INTRAVENOUS | Status: AC
Start: 2017-12-26 — End: 2017-12-26
  Administered 2017-12-26: 08:00:00 900 mg via INTRAVENOUS

## 2017-12-26 MED ORDER — ONDANSETRON HCL 4 MG/2ML IJ SOLN
INTRAMUSCULAR | Status: AC
Start: 2017-12-26 — End: ?
  Filled 2017-12-26: qty 2

## 2017-12-26 MED ORDER — ACETAMINOPHEN 500 MG PO TABS
1000.0000 mg | ORAL_TABLET | Freq: Once | ORAL | Status: AC
Start: 2017-12-26 — End: 2017-12-26
  Administered 2017-12-26: 08:00:00 1000 mg via ORAL

## 2017-12-26 MED ORDER — BISACODYL 10 MG RE SUPP
10.0000 mg | Freq: Every day | RECTAL | Status: DC | PRN
Start: 2017-12-26 — End: 2017-12-28

## 2017-12-26 MED ORDER — BACITRACIN 50000 UNITS IM SOLR
INTRAMUSCULAR | Status: DC | PRN
Start: 2017-12-26 — End: 2017-12-26
  Administered 2017-12-26: 50000 [IU]

## 2017-12-26 MED ORDER — ALBUTEROL SULFATE HFA 108 (90 BASE) MCG/ACT IN AERS
2.0000 | INHALATION_SPRAY | RESPIRATORY_TRACT | Status: DC | PRN
Start: 2017-12-26 — End: 2017-12-28
  Filled 2017-12-26: qty 1

## 2017-12-26 MED ORDER — MEPERIDINE HCL 25 MG/ML IJ SOLN
12.5000 mg | Freq: Once | INTRAMUSCULAR | Status: DC | PRN
Start: 2017-12-26 — End: 2017-12-26

## 2017-12-26 MED ORDER — MIDAZOLAM HCL 2 MG/2ML IJ SOLN
INTRAMUSCULAR | Status: AC
Start: 2017-12-26 — End: ?
  Filled 2017-12-26: qty 2

## 2017-12-26 MED ORDER — BACITRACIN 50000 UNITS IM SOLR
INTRAMUSCULAR | Status: AC
Start: 2017-12-26 — End: 2017-12-26
  Filled 2017-12-26: qty 50000

## 2017-12-26 MED ORDER — MAGNESIUM HYDROXIDE 400 MG/5ML PO SUSP
10.0000 mL | ORAL | Status: DC | PRN
Start: 2017-12-26 — End: 2017-12-28

## 2017-12-26 MED ORDER — MICROFIBRILLAR COLL HEMOSTAT EX POWD
CUTANEOUS | Status: DC | PRN
Start: 2017-12-26 — End: 2017-12-26
  Administered 2017-12-26: 1 g via TOPICAL

## 2017-12-26 MED ORDER — CALCIUM CHLORIDE 10 % IV SOLN
INTRAVENOUS | Status: DC | PRN
Start: 2017-12-26 — End: 2017-12-26
  Administered 2017-12-26: 0.5 g

## 2017-12-26 MED ORDER — PROPOFOL 10 MG/ML IV EMUL (WRAP)
INTRAVENOUS | Status: AC
Start: 2017-12-26 — End: ?
  Filled 2017-12-26: qty 20

## 2017-12-26 MED ORDER — LACTATED RINGERS IV SOLN
50.0000 mL/h | INTRAVENOUS | Status: DC
Start: 2017-12-26 — End: 2017-12-26

## 2017-12-26 MED ORDER — PHENYLEPHRINE 100 MCG/ML IN NACL 0.9% IV SOSY
PREFILLED_SYRINGE | INTRAVENOUS | Status: DC | PRN
Start: 2017-12-26 — End: 2017-12-26
  Administered 2017-12-26 (×4): 100 ug via INTRAVENOUS

## 2017-12-26 MED ORDER — PANTOPRAZOLE SODIUM 40 MG PO TBEC
40.0000 mg | DELAYED_RELEASE_TABLET | Freq: Every morning | ORAL | Status: DC
Start: 2017-12-26 — End: 2017-12-28
  Administered 2017-12-27 – 2017-12-28 (×2): 40 mg via ORAL
  Filled 2017-12-26 (×2): qty 1

## 2017-12-26 MED ORDER — CLINDAMYCIN PHOSPHATE IN D5W 900 MG/50ML IV SOLN
INTRAVENOUS | Status: AC
Start: 2017-12-26 — End: ?
  Filled 2017-12-26: qty 50

## 2017-12-26 MED ORDER — SENNOSIDES-DOCUSATE SODIUM 8.6-50 MG PO TABS
1.0000 | ORAL_TABLET | Freq: Two times a day (BID) | ORAL | Status: DC
Start: 2017-12-26 — End: 2017-12-28
  Administered 2017-12-27 – 2017-12-28 (×3): 1 via ORAL
  Filled 2017-12-26 (×3): qty 1

## 2017-12-26 MED ORDER — THROMBIN 5000 UNITS EX SOLR
CUTANEOUS | Status: DC | PRN
Start: 2017-12-26 — End: 2017-12-26
  Administered 2017-12-26 (×2): 5000 [IU] via TOPICAL

## 2017-12-26 MED ORDER — DICYCLOMINE HCL 10 MG PO CAPS
10.0000 mg | ORAL_CAPSULE | Freq: Two times a day (BID) | ORAL | Status: DC
Start: 2017-12-26 — End: 2017-12-28
  Administered 2017-12-26 – 2017-12-28 (×4): 10 mg via ORAL
  Filled 2017-12-26 (×5): qty 1

## 2017-12-26 MED ORDER — DEXAMETHASONE SODIUM PHOSPHATE 20 MG/5ML IJ SOLN
INTRAMUSCULAR | Status: AC
Start: 2017-12-26 — End: ?
  Filled 2017-12-26: qty 5

## 2017-12-26 MED ORDER — DIAZEPAM 5 MG PO TABS
5.0000 mg | ORAL_TABLET | Freq: Three times a day (TID) | ORAL | Status: DC | PRN
Start: 2017-12-26 — End: 2017-12-28
  Administered 2017-12-27 (×3): 5 mg via ORAL
  Filled 2017-12-26 (×3): qty 1

## 2017-12-26 MED ORDER — HYDROMORPHONE HCL 0.5 MG/0.5 ML IJ SOLN
0.4000 mg | INTRAMUSCULAR | Status: DC | PRN
Start: 2017-12-26 — End: 2017-12-28
  Administered 2017-12-26 – 2017-12-27 (×5): 0.4 mg via INTRAVENOUS
  Filled 2017-12-26 (×5): qty 1

## 2017-12-26 MED ORDER — FENTANYL CITRATE (PF) 50 MCG/ML IJ SOLN (WRAP)
INTRAMUSCULAR | Status: AC
Start: 2017-12-26 — End: 2017-12-26
  Administered 2017-12-26: 13:00:00 25 ug via INTRAVENOUS
  Filled 2017-12-26: qty 2

## 2017-12-26 MED ORDER — ONDANSETRON HCL 4 MG/2ML IJ SOLN
4.0000 mg | Freq: Three times a day (TID) | INTRAMUSCULAR | Status: DC | PRN
Start: 2017-12-26 — End: 2017-12-28
  Administered 2017-12-26 – 2017-12-27 (×2): 4 mg via INTRAVENOUS
  Filled 2017-12-26 (×2): qty 2

## 2017-12-26 MED ORDER — GABAPENTIN 300 MG PO CAPS
ORAL_CAPSULE | ORAL | Status: AC
Start: 2017-12-26 — End: ?
  Filled 2017-12-26: qty 1

## 2017-12-26 MED ORDER — OXYCODONE-ACETAMINOPHEN 5-325 MG PO TABS
ORAL_TABLET | ORAL | Status: AC
Start: 2017-12-26 — End: 2017-12-26
  Administered 2017-12-26: 15:00:00 2 via ORAL
  Filled 2017-12-26: qty 2

## 2017-12-26 MED ORDER — VANCOMYCIN HCL 1 G IV SOLR
INTRAVENOUS | Status: AC
Start: 2017-12-26 — End: 2017-12-26
  Filled 2017-12-26: qty 1000

## 2017-12-26 MED ORDER — DEXAMETHASONE SODIUM PHOSPHATE 4 MG/ML IJ SOLN (WRAP)
INTRAMUSCULAR | Status: DC | PRN
Start: 2017-12-26 — End: 2017-12-26
  Administered 2017-12-26: 10 mg via INTRAVENOUS

## 2017-12-26 MED ORDER — CLINDAMYCIN PHOSPHATE IN D5W 600 MG/50ML IV SOLN
600.0000 mg | Freq: Three times a day (TID) | INTRAVENOUS | Status: AC
Start: 2017-12-26 — End: 2017-12-27
  Administered 2017-12-26 – 2017-12-27 (×2): 600 mg via INTRAVENOUS
  Filled 2017-12-26: qty 50

## 2017-12-26 MED ORDER — THROMBIN 5000 UNITS EX SOLR
CUTANEOUS | Status: AC
Start: 2017-12-26 — End: 2017-12-26
  Filled 2017-12-26: qty 10000

## 2017-12-26 MED ORDER — ONDANSETRON HCL 4 MG/2ML IJ SOLN
INTRAMUSCULAR | Status: DC | PRN
Start: 2017-12-26 — End: 2017-12-26
  Administered 2017-12-26: 4 mg via INTRAVENOUS

## 2017-12-26 MED ORDER — PROPOFOL INFUSION 10 MG/ML
INTRAVENOUS | Status: DC | PRN
Start: 2017-12-26 — End: 2017-12-26
  Administered 2017-12-26: 100 ug/kg/min via INTRAVENOUS

## 2017-12-26 MED ORDER — ONDANSETRON HCL 4 MG/2ML IJ SOLN
4.0000 mg | Freq: Once | INTRAMUSCULAR | Status: DC | PRN
Start: 2017-12-26 — End: 2017-12-26

## 2017-12-26 MED ORDER — PATIENT SUPPLIED NON FORMULARY
1.0000 | Freq: Every day | Status: DC
Start: 2017-12-26 — End: 2017-12-26

## 2017-12-26 MED ORDER — FLUTICASONE FUROATE-VILANTEROL 100-25 MCG/INH IN AEPB
1.0000 | INHALATION_SPRAY | Freq: Every morning | RESPIRATORY_TRACT | Status: DC
Start: 2017-12-26 — End: 2017-12-28
  Administered 2017-12-27 – 2017-12-28 (×2): 1 via RESPIRATORY_TRACT
  Filled 2017-12-26: qty 14

## 2017-12-26 MED ORDER — FAMOTIDINE 20 MG/2ML IV SOLN
INTRAVENOUS | Status: AC
Start: 2017-12-26 — End: ?
  Filled 2017-12-26: qty 2

## 2017-12-26 SURGICAL SUPPLY — 94 items
ADHESIVE SKIN CLOSURE DERMABOND MINI .36 (Suture) ×2
ADHESIVE SKIN CLOSURE DERMABOND MINI .36 ML LIQUID APPLICATOR (Suture) ×2 IMPLANT
ANGIO CATH CODM (Procedure Accessories) ×2 IMPLANT
BANDAGE COBAN STERILE 4IN LF (Bandage) ×1
BANDAGE STERI-STRIP 0.5X4IN (Dressing) ×1
BIT DRILL L12 MM (Drillbits) ×1
BIT DRILL L12 MM PROVIDENCE (Drillbits) ×1 IMPLANT
BLADE ELECTRODE INSULATED (Cautery) ×1
BULB DRAINAGE LIGHTWEIGHT LOW LEVEL (Drain) ×1
BULB DRAINAGE LIGHTWEIGHT LOW LEVEL SUCTION RELIAVAC SILICONE 100 CC (Drain) ×1 IMPLANT
BUR DISSECTING MATCH 14CMX30MM (Burr) ×1
CNTNRW LID CLR 8OZ (Suction) ×1
CONTAINER SPEC 8OZ NS SNPON LID TRNLU (Suction) ×1 IMPLANT
DERMABOND MINI (Suture) ×2
DRAIN SUCTION ROUND 7FR (Drain) ×2 IMPLANT
DRAN EVACUATOR WOUND 100CC (Drain) ×1
DRAPE DEVON MAG INST 16X20IN (Drape) ×1
DRAPE INST MAG 121 (Drape) ×2 IMPLANT
DRAPE MAGNETIC HAND FREE TRANSFER (Drape) ×1 IMPLANT
DRAPE STERI MINOR-PROCEDURE (Drape) ×2 IMPLANT
DRAPE SURGICAL FANFOLD L98 IN X W72 IN (Drape) ×3
DRAPE SURGICAL FANFOLD L98 IN X W72 IN CONVERTORS TIBURON LARGE (Drape) ×3 IMPLANT
DRESSING TEGADERM 4X4X3/4IN (Dressing) ×2
DRESSING TRANSPARENT L4 3/4 IN X W4 IN (Dressing) ×2
DRESSING TRANSPARENT L4 3/4 IN X W4 IN POLYURETHANE ADHESIVE (Dressing) ×2 IMPLANT
DRILL BIT 12MM (Drillbits) ×1
ELECTRODE ELECTROSURGICAL BLADE L4 IN (Cautery) ×1
ELECTRODE ELECTROSURGICAL BLADE L4 IN OD3/32 IN EDGE L.2 IN INSULATE (Cautery) ×1 IMPLANT
FORCEPS ELECTROSURGICAL L7 3/4 IN 1.5 MM BAYONET BIPOLAR INSULATE CORD (Disposable Instruments) ×1 IMPLANT
FORCEPS ESURG SS 1.5MM BYNT LBRTY S-G (Disposable Instruments) ×1
FORCEPS SCVL BAYONET 7-3/4IN (Disposable Instruments) ×1
GAUZE SPONGE VERSLN 4PLY 4X4IN (Dressing) ×2 IMPLANT
GLOVE SURG BIOGEL INDIC SZ 7.0 (Glove) ×1
GLOVE SURG BIOGEL INDIC SZ 8.5 (Glove) ×4 IMPLANT
GLOVE SURGICAL 7 BIOGEL INDICATOR POWDER (Glove) ×1
GLOVE SURGICAL 7 BIOGEL INDICATOR POWDER FREE SMOOTH BEAD CUFF (Glove) ×1 IMPLANT
GRAFT BONE DEMINERALIZED CORTICAL BONE 5 CC ALLOGRAFT FIBER (Graft) ×1 IMPLANT
GRAFT INFLUX DBM FIBERS 5CC (Graft) ×2 IMPLANT
KIT DURAPREP ANTIMICROBIAL (Prep) ×1
KIT PREP LABORATORY ERYTHROCYTE AND MARROW MAGELLAN MAR01 (Kits) ×1 IMPLANT
KIT PREP MARROW (Kits) ×2
NEEDLE REG BEVEL 19GX1.5IN (Needles) ×2 IMPLANT
NEEDLE SPINAL DISP 18GX3.5IN (Needles) ×4 IMPLANT
PACK SPNE FFX (Pack) ×2 IMPLANT
PIN DISTRACTION STRL 14MM (Procedure Accessories) ×4 IMPLANT
PLATE L78 MM SPINE CERVICAL ANTERIOR 4 (Plate) ×1 IMPLANT
PLATE L78 MM SPINE CERVICAL ANTERIOR 4 LEVEL XTEND BONE (Plate) ×1 IMPLANT
PLATE XTEND 4LVL 78MM (Plate) ×1 IMPLANT
SCREW L14 MM OD4.6 MM SPINE SELF DRILL (Screw) ×2 IMPLANT
SCREW L14 MM OD4.6 MM SPINE SELF DRILL VARIABLE ANGLE BONE VIP (Screw) ×2 IMPLANT
SCREW L16 MM OD4.6 MM SPINE SELF DRILL (Screw) ×8 IMPLANT
SCREW L16 MM OD4.6 MM SPINE SELF DRILL VARIABLE ANGLE BONE VIP (Screw) ×8 IMPLANT
SCREW VAR ANGL SLF-DRL 4.6X14 (Screw) ×2 IMPLANT
SCREW VBLANGL 4.6MM SDRL 16MM (Screw) ×8 IMPLANT
SHEET LARGE DRAPE 72X86IN (Drape) ×3
SOL NACL.9% 1000ML IRR NONLTX (Irrigation Solutions) ×1
SOLUTION IRRIGATION 0.9% SODIUM CHLORIDE (Irrigation Solutions) ×1
SOLUTION IRRIGATION 0.9% SODIUM CHLORIDE 1000 ML PLASTIC POUR BOTTLE (Irrigation Solutions) ×1 IMPLANT
SOLUTION SURGICAL PREP 26 ML DURAPREP (Prep) ×1
SOLUTION SURGICAL PREP 26 ML DURAPREP 74% ISOPROPYL ALCOHOL 0.7% (Prep) ×1 IMPLANT
SPACER ACDF W16 MM X H6 MM 7 D D14 MM XL (Spacer) ×1 IMPLANT
SPACER ACDF W16 MM X H6 MM 7 D D14 MM XL COLONIAL PEEK SPINAL (Spacer) ×1 IMPLANT
SPACER ACDF W16 MM X H7 MM 7 D D14 MM XL (Spacer) ×1 IMPLANT
SPACER ACDF W16 MM X H7 MM 7 D D14 MM XL LORDOTIC COLONIAL PEEK SPINAL (Spacer) ×1 IMPLANT
SPACER ACDF W18 MM X H7 MM 7 D D15 MM (Spacer) ×1 IMPLANT
SPACER ACDF W18 MM X H7 MM 7 D D15 MM 2XL COLONIAL PEEK SPINAL (Spacer) ×1 IMPLANT
SPACER ACDF W18 MM X H8 MM 7 D D15 MM (Spacer) ×1 IMPLANT
SPACER ACDF W18 MM X H8 MM 7 D D15 MM 2XL COLONIAL PEEK SPINAL (Spacer) ×1 IMPLANT
SPACER COLONIAL ACDF 6MM XL (Spacer) ×1 IMPLANT
SPACER COLONIAL ACDF 7MM XXL (Spacer) ×1 IMPLANT
SPACER COLONIAL ACDF 8MM XXL (Spacer) ×1 IMPLANT
SPACER COLONIAL XLG 7MM (Spacer) ×1 IMPLANT
SPONGE DRAIN 6PLY 4X2 AMD (Dressing) ×2 IMPLANT
SPONGE GAUZE 12PLY STRL 4X4IN (Sponge) ×2 IMPLANT
STAPLER SKIN REG (Skin Closure) ×2 IMPLANT
STRIP SKIN CLOSURE L4 IN X W1/2 IN (Dressing) ×1
STRIP SKIN CLOSURE L4 IN X W1/2 IN REINFORCE STERI-STRIP POLYESTER (Dressing) ×1 IMPLANT
SUTURE MONOCRYL 3-0 PS2 27IN (Suture) ×4 IMPLANT
SYRINGE LUER LOCK 10CC (Syringes, Needles) ×8 IMPLANT
SYSTEM IRR MTL VTVU YNKR 12FR LF STRL (Suction) ×1
SYSTEM IRRIGATION EXTEND SUCTION ILLUMINATION TUBE BULB TIP VITAL-VUE (Suction) ×1 IMPLANT
TBNG CON SCTN STRL .1875X10 (Tubing) ×2
TOOL DISSECTING L14 CM MATCH HEAD FLUTE (Burr) ×1
TOOL DISSECTING L14 CM MATCH HEAD FLUTE OD3 MM MIDAS REX LEGEND (Burr) ×1 IMPLANT
TOWEL L27 IN X W17 IN COTTON PREWASH (Other) ×1
TOWEL L27 IN X W17 IN COTTON PREWASH DELINT HIGH ABSORBENT BLUE (Other) ×1 IMPLANT
TOWEL OR DISP 10PK (Other) ×1
TUBING SUCTION ID3/16 IN L10 FT (Tubing) ×2
TUBING SUCTION ID3/16 IN L10 FT NONCONDUCTIVE FML CONNECTOR MEDLN (Tubing) ×2 IMPLANT
TUBING SUCTION YANKAUER W/BULB (Suction) ×1
WAX BONE 2.5 G NATURAL (Hemostat) ×1 IMPLANT
WAX BONE 2.5GM (Hemostat) ×1
WRAP COMPRESSION L5 YD X W4 IN SELF (Bandage) ×1
WRAP COMPRESSION L5 YD X W4 IN SELF ADHERENT ELASTIC LIGHTWEIGHT HAND (Bandage) ×1 IMPLANT

## 2017-12-26 NOTE — Interval H&P Note (Signed)
History and physical exam reviewed.  Patient examined and findings confirmed.  We reviewed risk benefits and alternatives again.  All questions answered.  Ready to proceed to surgery.

## 2017-12-26 NOTE — OR Nursing (Signed)
Correct levels for procedure, C3-7, confirmed via x-ray by Dr. Kizzie Bane prior to incision.

## 2017-12-26 NOTE — Op Note (Signed)
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  Intraoperative SSEP monitoring, Upper Extremities C5 EMG's  2.  Intraoperative fluoroscopy.    3.  Morselized Allograft/Influx  4   Anterior cervical discectomy      5.  Anterior cervical fusion,    6.  Anterior instrumentation, Xtend  7.  Intradiscal Cage, [place]      PREOPERATIVE PROPHYLAXIS:   Ancef 2 gms and 10 mg of Decadron given prior to the skin incision.      INDICATIONS:  This 62 year old female presents with recalcitrant neck and arm pain. Image proven nerve and cord compression was present.  Risks/limitations, benefits and alternatives were thoroughly discussed preoperatively.     DESCRIPTION OF PROCEDURE:  The patient was brought awake and alert from the pre anesthesia area to the operating room.  The patient had an adequate general endotracheal tube anesthetic induced.  Pre-positioning SSEP's and EMG C5 indicated a monitoring baseline was present.  The patient was given preoperative medications and placed in the beach-chair position, head on a horseshoe.  Neck position was determined based on a preoperative exam of appropriate position of the neck to prevent symptoms.  Post- positioning SSEP's EMG indicated no significant change.   The neck was sterilely prepped and draped with Betadine in the usual  fashion.  Utilizing a standard longitudinal incision under fluoroscopic control on the left side of the neck, sharp and blunt dissection and Bovie hemostasis down to the level of the anterior cervical fascia was achieved.  The superior and recurrent laryngeal nerves were identified and protected throughout the procedure.   Trachea and esophagus were retracted medially and the sternocleidomastoid and carotid were gently retracted laterally.  Deep retractors were placed beneath the longissimus colli and Caspari distractor pins, 14-mm type, were placed to distract the cervical discs from C3-7.  Upon the last disc C 6-7 distraction there was new onset right C 5 firing. I immediately released distraction, checked BP and ensured Steroids given. I did a through foraminotomy on the right and the firing decreased.  Neck positioning was also done. The amplitude decreased and the cage was placed at C67 and the amplitude was minimal.    The radical discectomy at C3-7 levels allowed for central and foraminal decompression and removal of herniated fragments after the posterior longitudinal ligament was removed.  Based on preoperative discussion, standard cage sizing instruments were then used to determine height and width of device.  The docking sites were cleared with a high-speed burr.  The cages were packed tightly with Influx.  The cages were then gently tapped into position at the C3-7 levels and all distractors were removed.  The hybrid allograft cages were firmly  affixed.       Attention was then turned to the anterior cervical instrumentation, which was affixed by screws with a moderate degree of purchase.  This was a standalone device and spans the vertebrae above and below the surgical defect. The wounds were copiously irrigated and closed in the usual fashion.  Drain was used based on evaluation of operative field.  There were no intraoperative complications.  Urine output, blood loss, and crystalloids per the  anesthetic notes.  The prognosis overall is excellent for improvement in this patient's symptoms.  EMG's showed no new firing of C5.    The above was done by myself with the assistance of Ruthell Rummage, PA-C, for general assistance with wound opening, closing, suction irrigation, protection and retraction of the neural elements.  The assistant was necessary for the successful outcome of the surgery.    Estimated Blood Loss:   * No values recorded between 12/26/2017  8:51 AM and 12/26/2017 12:05 PM *    Implants:     Implant Name Type Inv. Item Serial No. Manufacturer Lot No. LRB No. Used Action   GRAFT INFLUX DBM FIBERS 5CC - [id number] Graft GRAFT INFLUX DBM FIBERS 5CC  [employer] [id number] N/A 1 Implanted   SPACER COLONIAL ACDF XL - [id number] Spacer SPACER COLONIAL ACDF XL  [employer]  N/A 1 Implanted   SPACER COLONIAL XLG - [id number] Spacer SPACER COLONIAL XLG  [employer]  N/A 1 Implanted   SPACER COLONIAL ACDF XXL - [id number] Spacer SPACER COLONIAL ACDF XXL  [employer]  N/A 1 Implanted   SPACER COLONIAL ACDF XXL - [id number] Spacer SPACER COLONIAL ACDF XXL  [employer]  N/A 1 Implanted   PLATE XTEND 4LVL - [id number] Plate PLATE XTEND 4LVL  [employer]  N/A 1 Implanted   SCREW VAR ANGL SLF-DRL 4.6X14 - [id number] Screw SCREW VAR ANGL SLF-DRL 4.6X14  [employer]  N/A 2 Implanted   SCREW VBLANGL 4.6MM SDRL - [id number] Screw SCREW VBLANGL 4.6MM SDRL   [employer]   N/A 8 Implanted       Drains:   Drains: yes JP 7mm fr    Specimens:   * No specimens in log *     SPECIMENS (last 24 hours)      Pathology Specimens     Row Name 12/26/17 1125                Additional Information    Send final report to: Dr. Kizzie Bane          Specimen Information    Specimen Testing Required Routine Pathology       Specimen ID  A       Specimen Description Disc Material           Complications:   C5 firing right, resolved intraop.    Signed by: Kizzie Ide, MD

## 2017-12-26 NOTE — Anesthesia Preprocedure Evaluation (Signed)
Anesthesia Evaluation    AIRWAY    Mallampati: II    TM distance: >3 FB  Neck ROM: full  Mouth Opening:full   CARDIOVASCULAR    cardiovascular exam normal       DENTAL        (+) upper dentures   PULMONARY    pulmonary exam normal and clear to auscultation     OTHER FINDINGS                  Relevant Problems   No relevant active problems               Anesthesia Plan    ASA 3     general                     intravenous induction   Detailed anesthesia plan: general endotracheal  Monitors/Adjuncts: BIS      Post op pain management: per surgeon    informed consent obtained    Plan discussed with CRNA.    ECG reviewed  pertinent labs reviewed  imaging results reviewed           Signed by: Ballard Russell 12/26/17 7:32 AM

## 2017-12-26 NOTE — Transfer of Care (Signed)
Anesthesia Transfer of Care Note    Patient: Chelsea Montoya    Procedures performed: Procedure(s) with comments:  FUSION, ANTERIOR CERVICAL, LEVELS 3+ - C3-C7 ANTERIOR CERVICAL DISCECTOMY & FUSION    Anesthesia type: General ETT    Patient location:Phase I PACU    Last vitals:   Vitals:    12/26/17 1240   BP: 154/79   Pulse: 92   Resp: 13   Temp:    SpO2: 98%       Post pain: Patient not complaining of pain, continue current therapy      Mental Status:sedated    Respiratory Function: tolerating face mask    Cardiovascular: stable    Nausea/Vomiting: patient not complaining of nausea or vomiting    Hydration Status: adequate    Post assessment: no apparent anesthetic complications    Signed by: Ballard Russell  12/26/17 12:51 PM

## 2017-12-26 NOTE — Brief Op Note (Signed)
BRIEF OP NOTE    Date Time: 12/26/17 12:39 PM    Patient Name:   Chelsea Montoya    Date of Operation:   12/26/2017    Providers Performing:   Surgeon(s):  Kizzie Ide, MD  Marcelino Scot, PA    Assistant (s):   Circulator: Nancie Neas, RN  Relief Circulator: Reed Pandy, RN  Relief Scrub: Juanito Doom  Preceptor: Gerrit Friends    Operative Procedure:   Procedure(s):  FUSION, ANTERIOR CERVICAL, LEVELS 3+    Preoperative Diagnosis:   Pre-Op Diagnosis Codes:     * Stenosis of cervical spine [M48.02]    Postoperative Diagnosis:   Post-Op Diagnosis Codes:     * Stenosis of cervical spine [M48.02]    Anesthesia:   Choice    Estimated Blood Loss:    25 mL    Implants:     Implant Name Type Inv. Item Serial No. Manufacturer Lot No. LRB No. Used Action   GRAFT INFLUX DBM FIBERS 5CC - [id number] Graft GRAFT INFLUX DBM FIBERS 5CC  [employer] [id number] N/A 1 Implanted   SPACER COLONIAL ACDF XL - [id number] Spacer SPACER COLONIAL ACDF XL  [employer]  N/A 1 Implanted   SPACER COLONIAL XLG - [id number] Spacer SPACER COLONIAL XLG  [employer]  N/A 1 Implanted   SPACER COLONIAL ACDF XXL - [id number] Spacer SPACER COLONIAL ACDF XXL  [employer]  N/A 1 Implanted   SPACER COLONIAL ACDF XXL - [id number] Spacer SPACER COLONIAL ACDF XXL  [employer]  N/A 1 Implanted   PLATE XTEND 4LVL - [id number] Plate PLATE XTEND 4LVL  [employer]  N/A 1 Implanted   SCREW VAR ANGL SLF-DRL 4.6X14 - [id number] Screw SCREW VAR ANGL SLF-DRL 4.6X14  [employer]  N/A 2 Implanted   SCREW VBLANGL 4.6MM SDRL - [id number] Screw SCREW VBLANGL 4.6MM SDRL   [employer]   N/A 8 Implanted       Drains:   Drains: Yes, Drain #1: [place] Round 7 Fr    Specimens:   * No specimens in log *     SPECIMENS (last 24 hours)      Pathology Specimens     Row Name 12/26/17 1125                Additional Information    Send final report to: Dr. Kizzie Bane          Specimen  Information    Specimen Testing Required Routine Pathology       Specimen ID  A       Specimen Description Disc Material           Findings:   HNP    Complications:   None      Signed by: Marcelino Scot, PA                                                                           [facility]

## 2017-12-26 NOTE — Plan of Care (Signed)
Problem: Anterior/Posterior Cervical Discectomy/Fusion  Goal: Free from infection  Outcome: Progressing   12/26/17 1751   Goal/Interventions addressed this shift   Free from infection  Monitor/assess vital signs;Assess for signs and symptoms of infection     Goal: Hemodynamic stability  Outcome: Progressing   12/26/17 1751   Goal/Interventions addressed this shift   Hemodynamic stability Monitor/assess vital signs     Goal: Mobility/activity is maintained at optimum level for patient  Outcome: Progressing   12/26/17 1751   Goal/Interventions addressed this shift   Mobility/activity is maintained at optimum level Log roll while in bed;Teach/review/reinforce spinal precautions with patient/patient care companion     Goal: Effective airway  Outcome: Progressing   12/26/17 1751   Goal/Interventions addressed this shift   Effective airway  Assess respiratory rate, O2 saturation, and work of breathing;Monitor/assess output from surgical drain if present       Problem: Safety  Goal: Patient will be free from injury during hospitalization  Outcome: Progressing   12/26/17 1751   Goal/Interventions addressed this shift   Patient will be free from injury during hospitalization  Assess patient's risk for falls and implement fall prevention plan of care per policy;Provide and maintain safe environment       Problem: Moderate/High Fall Risk Score >5  Goal: Patient will remain free of falls  Outcome: Progressing   12/26/17 1652   OTHER   High (Greater than 13) HIGH-Activate bed/chair exit alarm where available;HIGH-Apply yellow "Fall Risk" arm band;HIGH-Initiate use of floor mats as appropriate;HIGH-Consider use of low bed       Comments: A&O x4, VSS, c/o pain to cervical neck controlled with PRN meds.  CT cervical neck done post-op; tolerated well.   Surgical airway kit @ bedside and HOB elevated.   JPx1 in place, dressing reinforced in PACU.   Family is to bring collar tonight so Pt can ambulate OOB.

## 2017-12-26 NOTE — Anesthesia Postprocedure Evaluation (Signed)
Anesthesia Post Evaluation    Patient: Chelsea Montoya    Procedure(s) with comments:  FUSION, ANTERIOR CERVICAL, LEVELS 3+ - C3-C7 ANTERIOR CERVICAL DISCECTOMY & FUSION    Anesthesia type: general    Last Vitals:   Vitals:    12/26/17 1600   BP: 154/76   Pulse: 87   Resp:    Temp: 36.7 C (98 F)   SpO2: 98%       Anesthesia Post Evaluation:     Patient Evaluated: PACU  Patient Participation: complete - patient participated  Level of Consciousness: awake and alert  Pain Score: 2  Pain Management: adequate  multimodal analgesia used between 6 hours prior to anesthesia start to PACU discharge    Airway Patency: patent    Anesthetic complications: No      PONV Status: none    Cardiovascular status: acceptable  Respiratory status: acceptable  Hydration status: acceptable        Anesthesia Qualified Clinical Data Registry 2018    PACU Reintubation  Did the Patient have general anesthesia with intubation: Yes  Did the Patient require reintubation in the PACU?: No  Was this a planned exubation trial (documented in the medical record)?: No    PONV Adult  Is the patient aged 56 or older: Yes  Did the patient receive recieve a general anesthestic: Yes  Does the patient have 3 or more risk factors for PONV? No        PONV Pediatric  Is the patient aged 79-17? No            PACU Transfer Checklist Protocol  Was the patient transferred to the PACU at the conclusion of surgery? Yes  Was a checklist or transfer protocol used? Yes    ICU Transfer Checklist Protocol  Was the patient transferred to the ICU at the conclusion of surgery? No      Post-op Pain Assessment Prior to Anesthesia Care End  Age >=18 and assessed for pain in PACU: Yes  Pacu pain score <7/10: Yes      Perioperative Mortality  Perioperative mortality prior to Anesthesia end time: No    Perioperative Cardiac Arrest  Did the patient have an unanticipated intraoperative cardiac arrest between anesthesia start time and anesthesia end time? No    Unplanned Admission to  ICU  Did the patient have an unplanned admission to the ICU (not initially anticipated at anesthesia start time)? No      Signed by: Ballard Russell, 12/26/2017 4:32 PM

## 2017-12-26 NOTE — Progress Notes (Signed)
CT was helping the pt got out of bed, when JP drain fell off. Pt stable. Not in distress. On- call AP notified.

## 2017-12-26 NOTE — Progress Notes (Signed)
Pt transferred from PACU; no aspen collar on, per husband family member will bring to hospital this evening. 1x JP in place, with dressing c/d/i.

## 2017-12-27 ENCOUNTER — Ambulatory Visit: Payer: BLUE CROSS/BLUE SHIELD

## 2017-12-27 LAB — CBC
Absolute NRBC: 0 x10 3/uL (ref 0.00–0.00)
Hematocrit: 39.6 % (ref 34.7–43.7)
Hgb: 13.3 g/dL (ref 11.4–14.8)
MCH: 29.4 pg (ref 25.1–33.5)
MCHC: 33.6 g/dL (ref 31.5–35.8)
MCV: 87.4 fL (ref 78.0–96.0)
MPV: 8.6 fL — ABNORMAL LOW (ref 8.9–12.5)
Nucleated RBC: 0 /100{WBCs} (ref 0.0–0.0)
Platelets: 315 x10 3/uL (ref 142–346)
RBC: 4.53 x10 6/uL (ref 3.90–5.10)
RDW: 13 % (ref 11–15)
WBC: 11.52 x10 3/uL — ABNORMAL HIGH (ref 3.10–9.50)

## 2017-12-27 LAB — COMPREHENSIVE METABOLIC PANEL
ALT: 15 U/L (ref 0–55)
AST (SGOT): 26 U/L (ref 5–34)
Albumin/Globulin Ratio: 0.9 (ref 0.9–2.2)
Albumin: 3.3 g/dL — ABNORMAL LOW (ref 3.5–5.0)
Alkaline Phosphatase: 59 U/L (ref 37–106)
BUN: 6 mg/dL — ABNORMAL LOW (ref 7.0–19.0)
Bilirubin, Total: 0.4 mg/dL (ref 0.2–1.2)
CO2: 25 mEq/L (ref 22–29)
Calcium: 9.4 mg/dL (ref 8.5–10.5)
Chloride: 102 mEq/L (ref 100–111)
Creatinine: 0.7 mg/dL (ref 0.6–1.0)
Globulin: 3.6 g/dL (ref 2.0–3.6)
Glucose: 143 mg/dL — ABNORMAL HIGH (ref 70–100)
Potassium: 4.3 mEq/L (ref 3.5–5.1)
Protein, Total: 6.9 g/dL (ref 6.0–8.3)
Sodium: 135 mEq/L — ABNORMAL LOW (ref 136–145)

## 2017-12-27 LAB — GFR: EGFR: >60

## 2017-12-27 MED ORDER — MELATONIN 3 MG PO TABS
10.00 mg | ORAL_TABLET | Freq: Every evening | ORAL | Status: DC
Start: 2017-12-27 — End: 2017-12-28
  Administered 2017-12-27: 23:00:00 9 mg via ORAL
  Filled 2017-12-27: qty 3

## 2017-12-27 MED ORDER — DEXAMETHASONE SODIUM PHOSPHATE 4 MG/ML IJ SOLN (WRAP)
6.00 mg | Freq: Three times a day (TID) | INTRAMUSCULAR | Status: AC
Start: 2017-12-27 — End: 2017-12-28
  Administered 2017-12-27 – 2017-12-28 (×3): 6 mg via INTRAVENOUS
  Filled 2017-12-27 (×3): qty 2

## 2017-12-27 MED ORDER — DEXAMETHASONE SODIUM PHOSPHATE 4 MG/ML IJ SOLN (WRAP)
8.00 mg | Freq: Three times a day (TID) | INTRAMUSCULAR | Status: DC
Start: 2017-12-27 — End: 2017-12-27
  Filled 2017-12-27 (×3): qty 2

## 2017-12-27 NOTE — Progress Notes (Signed)
PROGRESS NOTE    Date Time: 12/27/17 11:40 AM  Patient Name: Chelsea Montoya, Chelsea Montoya      Assessment:   Post op day # 1 s/p ACDF C3-7    Plan:   1.  S/p fusion  -Pt w/ new onset C5 palsy.  Will order stat MRI.  Pt to start decadron 6 mg q 8 hrs x 3 doses.  Pain w/ ? Control.  Have discussed w/ pt to stop IV dilaudid.  Start taking valium for pain.    Subjective:   Pt is post op day # 1 s/p ACDF C3-7.  Pt states she is unable to raise her right arm.  Left arm no problem.  Pt states her pain is uncontrolled.  Is taking IV dilaudid often.  Is up and walking.  Dr. Kizzie Bane' examination at post op showed pt was able to raise both arms.  Pt denies HA, dizziness, sob, CP, and N/v.      Medications:     Current Facility-Administered Medications   Medication Dose Route Frequency   . dexamethasone  6 mg Intravenous Q8H SCH   . dicyclomine  10 mg Oral BID   . fluticasone furoate-vilanterol  1 puff Inhalation QAM   . pantoprazole  40 mg Oral QAM   . senna-docusate  1 tablet Oral BID       Review of Systems:   A comprehensive review of systems was: Positive for neck pain    Physical Exam:     Vitals:    12/27/17 0739   BP: 128/69   Pulse: 81   Resp: 20   Temp: 98.3 F (36.8 C)   SpO2: 93%       Intake and Output Summary (Last 24 hours) at Date Time    Intake/Output Summary (Last 24 hours) at 12/27/17 1140  Last data filed at 12/27/17 0100   Gross per 24 hour   Intake              600 ml   Output              375 ml   Net              225 ml       EXAM:  WD/WN: NAD;  A&O x 4  A:  Neg  Dressing:  C/Montoya/I  JP drain:  Discontinued  Incision:  C/Montoya/I  Arms:  Right side:  C5 palsy.  Inability to perform C3-5 exam on the right; 5/5 on the left.  C6-8 5/5 on the right.  Sensation intact.    Labs:     Results     Procedure Component Value Units Date/Time    Comprehensive metabolic panel [[id number]]  (Abnormal) Collected:  12/27/17 0353    Specimen:  Blood Updated:  12/27/17 0450     Glucose 143 (H) mg/dL      BUN 6.0 (L) mg/dL      Creatinine 0.7  mg/dL      Sodium 347 (L) mEq/L      Potassium 4.3 mEq/L      Chloride 102 mEq/L      CO2 25 mEq/L      Calcium 9.4 mg/dL      Protein, Total 6.9 g/dL      Albumin 3.3 (L) g/dL      AST (SGOT) 26 U/L      ALT 15 U/L      Alkaline Phosphatase 59 U/L      Bilirubin, Total 0.4  mg/dL      Globulin 3.6 g/dL      Albumin/Globulin Ratio 0.9    GFR [[id number]] Collected:  12/27/17 0353     Updated:  12/27/17 0450     EGFR >60.0    CBC without differential [[id number]]  (Abnormal) Collected:  12/27/17 0353    Specimen:  Blood from Blood Updated:  12/27/17 0425     WBC 11.52 (H) x10 3/uL      Hgb 13.3 g/dL      Hematocrit 41.6 %      Platelets 315 x10 3/uL      RBC 4.53 x10 6/uL      MCV 87.4 fL      MCH 29.4 pg      MCHC 33.6 g/dL      RDW 13 %      MPV 8.6 (L) fL      Nucleated RBC 0.0 /100 WBC      Absolute NRBC 0.00 x10 3/uL           Recent CBC   Recent Labs      12/27/17   0353   RBC  4.53   Hgb  13.3   Hematocrit  39.6   MCV  87.4   MCH  29.4   MCHC  33.6   RDW  13   MPV  8.6*       Rads:   Radiological Procedure reviewed.    Signed by: Marcelino Scot

## 2017-12-27 NOTE — Plan of Care (Signed)
Problem: Anterior/Posterior Cervical Discectomy/Fusion  Goal: Free from infection  Outcome: Progressing   12/27/17 0258   Goal/Interventions addressed this shift   Free from infection  Monitor/assess vital signs;Assess for signs and symptoms of infection;Assess surgical dressing, reinforce or change as needed per order;Teach/reinforce use of incentive spirometer 10 times per hour while awake, cough and deep breath as needed;Foley maintenance care if Foley in place. Utilize indwelling urinary catheter policy/protocol ([place]).     Goal: Hemodynamic stability  Outcome: Progressing   12/27/17 0258   Goal/Interventions addressed this shift   Hemodynamic stability Monitor/assess vital signs;Monitor intake and output. Notify LIP if urine output is less than 240 mL in 8 hours.;Monitor/assess output from surgical drain if present;Monitor/assess lab values. Notify LIP of abnormal results.     Goal: Mobility/activity is maintained at optimum level for patient  Outcome: Progressing   12/27/17 0258   Goal/Interventions addressed this shift   Mobility/activity is maintained at optimum level Log roll while in bed;Teach/review/reinforce spinal precautions with patient/patient care companion;Instruct and apply cervical collar as needed and if ordered;Keep HOB 30 degrees, unless contraindicated;Out of bed standing with assistance, prior to midnight;Modified independent/independent ambulation greater than or equal to 50 feet     Goal: Pain at adequate level as identified by patient  Outcome: Progressing   12/27/17 0258   Goal/Interventions addressed this shift   Pain at adequate level as identified by patient Identify and evaluate patient comfort function goal;Administer analgesics as ordered to achieve patient comfort function goal;Administer muscle relaxer as prescribed     Goal: Stable neurovascular status  Outcome: Progressing   12/27/17 0258   Goal/Interventions addressed this shift   Stable neurovascular status Monitor/assess  neurovascular status (pulses, capillary refill, pain, paresthesia, presence of edema)     Goal: Effective airway  Outcome: Progressing   12/27/17 0258   Goal/Interventions addressed this shift   Effective airway  Assess respiratory rate, O2 saturation, and work of breathing;Monitor/assess output from surgical drain if present;Maintain surgical airway kit or tracheostomy tray at bedside;Assess surgical site for hematoma development     Goal: Patient/patient care companion demonstrates understanding on disease process, treatment plan, medications and discharge plans  Outcome: Progressing      Problem: Safety  Goal: Patient will be free from injury during hospitalization  Outcome: Progressing   12/27/17 0258   Goal/Interventions addressed this shift   Patient will be free from injury during hospitalization  Assess patient's risk for falls and implement fall prevention plan of care per policy;Provide and maintain safe environment;Use appropriate transfer methods;Ensure appropriate safety devices are available at the bedside;Include patient/ family/ care giver in decisions related to safety;Hourly rounding       Problem: Psychosocial and Spiritual Needs  Goal: Demonstrates ability to cope with hospitalization/illness  Outcome: Progressing   12/27/17 0258   Goal/Interventions addressed this shift   Demonstrates ability to cope with hospitalizations/illness Encourage verbalization of feelings/concerns/expectations;Provide quiet environment       Problem: Moderate/High Fall Risk Score >5  Goal: Patient will remain free of falls   12/26/17 1652   OTHER   High (Greater than 13) HIGH-Activate bed/chair exit alarm where available;HIGH-Apply yellow "Fall Risk" arm band;HIGH-Initiate use of floor mats as appropriate;HIGH-Consider use of low bed       Comments: Pt AOx4. Denies CP and n/v.  O2 at 2 LPM via NC, O2 sat of 94-95%.  Complained of pain on anterior neck rated as 6-9/10, Prn Dilaudid and Percocet.  Complained of nausea,  PRN Zofran  given.  Dressing changed.  Tolerating diet. Voiding freely.  Comfort and safety measures provided.  Call bell within reach.  Bed locked and at lowest level  Floor mats in place.  WCTM.

## 2017-12-27 NOTE — Progress Notes (Addendum)
Pt in MRI, c/o anxiety. PA called for medication and instructed RN to give PRN Valium early, no IV meds will be ordered. MRI made aware.     1700; Pt returned from MRI and was agreeable to ambulating, walked 5 laps around unit. Aspen collar in place.

## 2017-12-27 NOTE — UM Notes (Signed)
UTILIZATION REVIEW CONTACT: Name: Jiles Prows, RN, BSN, MHA, MBA   PRN Clinical Case Manager  - Utilization Review  [facility]  Address:  [address] [place], [place]  [address]  NPI:   [id number]  Tax ID:  [id number]  Phone: [phone]  Fax: [phone]    Please use fax number [phone] to provide authorization for hospital services or to request additional information.        PATIENT NAME: Chelsea Montoya, Chelsea Montoya   DOB: 11-23-1955   PMH:  has a past medical history of Anemia; Anxiety; Arrhythmia; Arthritis; Asthma without status asthmaticus; Blood transfusion without reported diagnosis; Complication of anesthesia; Crohn's colitis (01/31/2012); Crohn's disease; Duplicated right renal collecting system; Heart murmur; Hypertension; Insomnia; Kidney stone; Mitral valve prolapse; Pneumonia; Pyelonephritis; Seizures; Shortness of breath; Thyroid nodule; and Vision abnormalities.  PSH:  has a past surgical history that includes Appendectomy (2000's); Gynecologic cryosurgery (1970's); Colectomy (1988); Bunionectomy (Right, 1998); CLOSED REDUCTION, PERCUTANEOUS FIXATION, FINGER (10/28/2012); Colonoscopy w/ biopsies (02/2016 2019); Endometrial biopsy (04/22/2014); THYROID NODULE BIOPSY (09/2014); Knee Arthroplasty (Right, 2001, 2016); Knee Arthroplasty (Left, 2009); and Elbow surgery (Right, 2010).     AMBULATORY REVIEW   Admission date: 12/26/2017     12/26/17 0754  Place in Outpatient/Ambulatory Status Once    Status:    Question Answer Comment   Admitting Physician Rachael Fee S    Diagnosis HNP (herniated nucleus pulposus) with myelopathy, cervical    Estimated Length of Stay Less than 23 Hours    Tentative Discharge Plan? Home or Self Care    Patient Class Hospital Outpatient Surgery (Amb Proc)            INDICATIONS FOR PROCEDURE: Pt is a 62 y.o. female with history of cervical spine stenosis who arrived to the hospital for elective surgery.    VS: T97.5, P92, O2 96%, R12, BP 172/88     PROCEDURE: Procedure(s):  FUSION, ANTERIOR CERVICAL, LEVELS 3+    ANESTHESIA: general     EBL:  25 mL    DRAINS: Yes, Drain #1: Jackson-Pratt Round 7 Fr    IMPLANTS:   Implant Name Type Inv. Item Serial No. Manufacturer Lot No. LRB No. Used Action   GRAFT INFLUX DBM FIBERS 5CC - [id number] Graft GRAFT INFLUX DBM FIBERS 5CC  [employer] [id number] N/A 1 Implanted   SPACER COLONIAL ACDF XL - [id number] Spacer SPACER COLONIAL ACDF XL  [employer]  N/A 1 Implanted   SPACER COLONIAL XLG - [id number] Spacer SPACER COLONIAL XLG  [employer]  N/A 1 Implanted   SPACER COLONIAL ACDF XXL - [id number] Spacer SPACER COLONIAL ACDF XXL  [employer]  N/A 1 Implanted   SPACER COLONIAL ACDF XXL - [id number] Spacer SPACER COLONIAL ACDF XXL  [employer]  N/A 1 Implanted   PLATE XTEND 4LVL - [id number] Plate PLATE XTEND 4LVL  [employer]  N/A 1 Implanted   SCREW VAR ANGL SLF-DRL 4.6X14 - [id number] Screw SCREW VAR ANGL SLF-DRL 4.6X14  [employer]  N/A 2 Implanted   SCREW VBLANGL 4.6MM SDRL - [id number] Screw SCREW VBLANGL 4.6MM SDRL   [employer]   N/A 8 Implanted     SPECIMENS: Disc Material    FINDINGS: HNP    COMPLICATIONS: None    NOTES TO REVIEWER:    This clinical review is based on/compiled from documentation provided by the treatment team within the patient's medical record.

## 2017-12-27 NOTE — Plan of Care (Signed)
Problem: Anterior/Posterior Cervical Discectomy/Fusion  Goal: Free from infection  Outcome: Progressing   12/27/17 0258   Goal/Interventions addressed this shift   Free from infection  Monitor/assess vital signs;Assess for signs and symptoms of infection;Assess surgical dressing, reinforce or change as needed per order;Teach/reinforce use of incentive spirometer 10 times per hour while awake, cough and deep breath as needed;Foley maintenance care if Foley in place. Utilize indwelling urinary catheter policy/protocol ([place]).     Goal: Hemodynamic stability  Outcome: Progressing    Goal: Mobility/activity is maintained at optimum level for patient  Outcome: Progressing   12/27/17 0258   Goal/Interventions addressed this shift   Mobility/activity is maintained at optimum level Log roll while in bed;Teach/review/reinforce spinal precautions with patient/patient care companion;Instruct and apply cervical collar as needed and if ordered;Keep HOB 30 degrees, unless contraindicated;Out of bed standing with assistance, prior to midnight;Modified independent/independent ambulation greater than or equal to 50 feet     Goal: Pain at adequate level as identified by patient   12/27/17 0258   Goal/Interventions addressed this shift   Pain at adequate level as identified by patient Identify and evaluate patient comfort function goal;Administer analgesics as ordered to achieve patient comfort function goal;Administer muscle relaxer as prescribed       Problem: Safety  Goal: Patient will be free from injury during hospitalization  Outcome: Progressing   12/27/17 0258   Goal/Interventions addressed this shift   Patient will be free from injury during hospitalization  Assess patient's risk for falls and implement fall prevention plan of care per policy;Provide and maintain safe environment;Use appropriate transfer methods;Ensure appropriate safety devices are available at the bedside;Include patient/ family/ care giver in decisions  related to safety;Hourly rounding       Problem: Moderate/High Fall Risk Score >5  Goal: Patient will remain free of falls  Outcome: Progressing   12/27/17 0812   OTHER   High (Greater than 13) HIGH-Activate bed/chair exit alarm where available;HIGH-Apply yellow "Fall Risk" arm band;HIGH-Initiate use of floor mats as appropriate;HIGH-Consider use of low bed       Comments: A&O x4, VSS, c/o pain to cervical neck controlled on current regimen.   Per PA avoid use of IV dilaudid, encourage PO pain meds.   RUE weakness noted, MRI ordered and checklist completed.   Tolerating PO intake, no N/V; adequate UOP, passing gas.   Possible [place] home tomorrow, pending MRI results.

## 2017-12-27 NOTE — UM Notes (Signed)
UTILIZATION REVIEW CONTACT: Name: Jiles Prows, RN, BSN, MHA, MBA   PRN Clinical Case Manager  - Utilization Review  [facility]  Address:  [address] [place], [place]  [address]  NPI:   [id number]  Tax ID:  [id number]  Phone: [phone]  Fax: [phone]    Please use fax number [phone] to provide authorization for hospital services or to request additional information.        PATIENT NAME: Chelsea Montoya, Chelsea Montoya   DOB: Jul 31, 1955   PMH:  has a past medical history of Anemia; Anxiety; Arrhythmia; Arthritis; Asthma without status asthmaticus; Blood transfusion without reported diagnosis; Complication of anesthesia; Crohn's colitis (01/31/2012); Crohn's disease; Duplicated right renal collecting system; Heart murmur; Hypertension; Insomnia; Kidney stone; Mitral valve prolapse; Pneumonia; Pyelonephritis; Seizures; Shortness of breath; Thyroid nodule; and Vision abnormalities.  PSH:  has a past surgical history that includes Appendectomy (2000's); Gynecologic cryosurgery (1970's); Colectomy (1988); Bunionectomy (Right, 1998); CLOSED REDUCTION, PERCUTANEOUS FIXATION, FINGER (10/28/2012); Colonoscopy w/ biopsies (02/2016 2019); Endometrial biopsy (04/22/2014); THYROID NODULE BIOPSY (09/2014); Knee Arthroplasty (Right, 2001, 2016); Knee Arthroplasty (Left, 2009); and Elbow surgery (Right, 2010).     CONTINUED STAY REVIEW     Status changed to INPATIENT on 12/27/17 @ 1615  See ambulatory review for full admission history     CSR Date: 12/27/17  Hospital Day: 2  Level of Care: MEDICINE UNIT     Interval History/24 hour events:  Pt is post op day # 1 s/p ACDF C3-7.  Pt states she is unable to raise her right arm.  Left arm no problem.  Pt states her pain is uncontrolled.  Is taking IV dilaudid often.  Is up and walking.  Dr. Kizzie Bane' examination at post op showed pt was able to raise both arms.     Visit Vitals  BP 139/75   Pulse 84   Temp (!) 96.7 F (35.9 C) (Oral)   Resp 18   Ht 1.524 m (5')   Wt  95.7 kg (211 lb)   SpO2 96%   BMI 41.21 kg/m     Physical Exam:   Dressing:  C/D/I  JP drain:  Discontinued  Incision:  C/D/I  Arms:  Right side:  C5 palsy.  Inability to perform C3-5 exam on the right; 5/5 on the left.  C6-8 5/5 on the right.  Sensation intact.      Recent Labs  Lab 12/27/17  0353   WBC 11.52*   Hgb 13.3   Hematocrit 39.6   Platelets 315       Recent Labs  Lab 12/27/17  0353   Sodium 135*   Potassium 4.3   Chloride 102   CO2 25   BUN 6.0*   Creatinine 0.7   EGFR >60.0   Glucose 143*   Calcium 9.4       Recent Labs  Lab 12/27/17  0353   Bilirubin, Total 0.4   Protein, Total 6.9   Albumin 3.3*   ALT 15   AST (SGOT) 26     Diagnostics: pending MRI    Current Facility-Administered Medications   Medication Dose Route Frequency Last Rate Last Dose   . 0.9%  NaCl infusion   Intravenous Continuous 75 mL/hr at 12/26/17 2222     . albuterol (PROVENTIL HFA;VENTOLIN HFA) inhaler 2 puff  2 puff Inhalation Q4H PRN       . benzocaine-menthol (CEPACOL) lozenge 1 lozenge  1 lozenge Buccal PRN       .  bisacodyl (DULCOLAX) suppository 10 mg  10 mg Rectal Daily PRN       . dexamethasone (DECADRON) injection 6 mg  6 mg Intravenous Q8H SCH   6 mg at 12/27/17 1155   . diazePAM (VALIUM) tablet 5 mg  5 mg Oral TID PRN   5 mg at 12/27/17 1023   . dicyclomine (BENTYL) capsule 10 mg  10 mg Oral BID   10 mg at 12/27/17 0930   . fluticasone furoate-vilanterol (BREO ELLIPTA) 100-25 MCG/INH 1 puff  1 puff Inhalation QAM   1 puff at 12/27/17 1035   . HYDROmorphone (DILAUDID) injection 0.4 mg  0.4 mg Intravenous Q2H PRN   0.4 mg at 12/27/17 0721   . lactated ringers infusion   Intravenous Continuous   Stopped at 12/26/17 1225   . magnesium hydroxide (MILK OF MAGNESIA) 400 MG/5ML suspension 10 mL  10 mL Oral Q4H PRN       . ondansetron (ZOFRAN-ODT) disintegrating tablet 4 mg  4 mg Oral Q8H PRN        Or   . ondansetron (ZOFRAN) injection 4 mg  4 mg Intravenous Q8H PRN   4 mg at 12/27/17 0203   . oxyCODONE-acetaminophen (PERCOCET)  5-325 MG per tablet 2 tablet  2 tablet Oral Q4H PRN   2 tablet at 12/27/17 1302   . pantoprazole (PROTONIX) EC tablet 40 mg  40 mg Oral QAM   40 mg at 12/27/17 0907   . senna-docusate (PERICOLACE) 8.6-50 MG per tablet 1 tablet  1 tablet Oral BID   1 tablet at 12/27/17 0907   . sodium phosphate (FLEET) enema 1 enema  1 enema Rectal Once PRN         Facility-Administered Medications Ordered in Other Encounters   Medication Dose Route Frequency Last Rate Last Dose   . glucagon (rDNA) (GLUCAGEN) injection 1 mg  1 mg Intravenous Once         Plan of Care/MEDS:   1.  S/p fusion  -Pt w/ new onset C5 palsy.  Will order stat MRI.  Pt to start decadron 6 mg q 8 hrs x 3 doses.  Pain w/ ? Control.  Have discussed w/ pt to stop IV dilaudid.  Start taking valium for pain.    NOTES TO REVIEWER:    This clinical review is based on/compiled from documentation provided by the treatment team within the patient's medical record.

## 2017-12-28 ENCOUNTER — Encounter: Payer: Self-pay | Admitting: Orthopaedic Surgery

## 2017-12-28 LAB — COMPREHENSIVE METABOLIC PANEL
ALT: 15 U/L (ref 0–55)
AST (SGOT): 23 U/L (ref 5–34)
Albumin/Globulin Ratio: 0.9 (ref 0.9–2.2)
Albumin: 3.2 g/dL — ABNORMAL LOW (ref 3.5–5.0)
Alkaline Phosphatase: 59 U/L (ref 37–106)
BUN: 7 mg/dL (ref 7.0–19.0)
Bilirubin, Total: 0.1 mg/dL — ABNORMAL LOW (ref 0.2–1.2)
CO2: 27 mEq/L (ref 22–29)
Calcium: 9.4 mg/dL (ref 8.5–10.5)
Chloride: 104 mEq/L (ref 100–111)
Creatinine: 0.7 mg/dL (ref 0.6–1.0)
Globulin: 3.6 g/dL (ref 2.0–3.6)
Glucose: 146 mg/dL — ABNORMAL HIGH (ref 70–100)
Potassium: 4.4 mEq/L (ref 3.5–5.1)
Protein, Total: 6.8 g/dL (ref 6.0–8.3)
Sodium: 139 mEq/L (ref 136–145)

## 2017-12-28 LAB — GFR: EGFR: >60

## 2017-12-28 LAB — CBC
Absolute NRBC: 0 10*3/uL (ref 0.00–0.00)
Hematocrit: 40.5 % (ref 34.7–43.7)
Hgb: 13.1 g/dL (ref 11.4–14.8)
MCH: 29 pg (ref 25.1–33.5)
MCHC: 32.3 g/dL (ref 31.5–35.8)
MCV: 89.6 fL (ref 78.0–96.0)
MPV: 9.1 fL (ref 8.9–12.5)
Nucleated RBC: 0 /100 WBC (ref 0.0–0.0)
Platelets: 318 10*3/uL (ref 142–346)
RBC: 4.52 10*6/uL (ref 3.90–5.10)
RDW: 13 % (ref 11–15)
WBC: 14.58 10*3/uL — ABNORMAL HIGH (ref 3.10–9.50)

## 2017-12-28 NOTE — Progress Notes (Signed)
PROGRESS NOTE    Date Time: 12/28/17 6:19 AM  Patient Name: Chelsea Montoya      Assessment:   Post op day # 2 s/p ACDF C3-7    Plan:   1.  S/p ACDF  -Pt w/ C5 palsy.  Con't decadron to complete 24 hr course  -Will order PT/OT  -Pain is controlled.  Con't oral pain regimen  -pt to con't to ambulate  -will likely Montoya/c in late afternoon after consultation w/ MD    Subjective:   Pt is post op day # 2 s/p ACDF C3-7.  Pt states pain is controlled w/ valium and percocet.  States she has more pain w/ swallowing today.  States she still cannot raise her right arm.  Does feel like she has strength in her hand.  Is able to write well.  Is urinating well.  Is feeling bloated and requesting ducolax.  Is passing gas.  MRI done yesterday.    Medications:     Current Facility-Administered Medications   Medication Dose Route Frequency   . dicyclomine  10 mg Oral BID   . fluticasone furoate-vilanterol  1 puff Inhalation QAM   . melatonin  10.5 mg Oral QHS   . pantoprazole  40 mg Oral QAM   . senna-docusate  1 tablet Oral BID       Review of Systems:   A comprehensive review of systems was: Pos for neck pain    Physical Exam:     Vitals:    12/28/17 0326   BP: 128/77   Pulse: 78   Resp: 16   Temp: 97.1 F (36.2 C)   SpO2: 94%       Intake and Output Summary (Last 24 hours) at Date Time    Intake/Output Summary (Last 24 hours) at 12/28/17 5638  Last data filed at 12/28/17 0343   Gross per 24 hour   Intake                0 ml   Output              400 ml   Net             -400 ml       EXAM:  WD/Wn; NAD;   A&O x 4  A:  Neg  Dressing:  C/Montoya/I  Neck:  Soft; limited ROM w/ some pain  Arms:  Pt unable to lift right arm above head.  Left arm able to completely raise above head.  Right arm:  C6-8 5/5.  Left arm C4-8 5/5    Labs:     Results     Procedure Component Value Units Date/Time    Comprehensive metabolic panel [[id number]]  (Abnormal) Collected:  12/28/17 0324    Specimen:  Blood Updated:  12/28/17 0521     Glucose 146 (H) mg/dL       BUN 7.0 mg/dL      Creatinine 0.7 mg/dL      Sodium 188 mEq/L      Potassium 4.4 mEq/L      Chloride 104 mEq/L      CO2 27 mEq/L      Calcium 9.4 mg/dL      Protein, Total 6.8 g/dL      Albumin 3.2 (L) g/dL      AST (SGOT) 23 U/L      ALT 15 U/L      Alkaline Phosphatase 59 U/L      Bilirubin, Total  0.1 (L) mg/dL      Globulin 3.6 g/dL      Albumin/Globulin Ratio 0.9    GFR [[id number]] Collected:  12/28/17 0324     Updated:  12/28/17 0521     EGFR >60.0    CBC without differential [[id number]]  (Abnormal) Collected:  12/28/17 0324    Specimen:  Blood from Blood Updated:  12/28/17 0436     WBC 14.58 (H) x10 3/uL      Hgb 13.1 g/dL      Hematocrit 33.8 %      Platelets 318 x10 3/uL      RBC 4.52 x10 6/uL      MCV 89.6 fL      MCH 29.0 pg      MCHC 32.3 g/dL      RDW 13 %      MPV 9.1 fL      Nucleated RBC 0.0 /100 WBC      Absolute NRBC 0.00 x10 3/uL           Recent CBC   Recent Labs      12/28/17   0324   RBC  4.52   Hgb  13.1   Hematocrit  40.5   MCV  89.6   MCH  29.0   MCHC  32.3   RDW  13   MPV  9.1       Rads:   Radiological Procedure reviewed.    Signed by: Marcelino Scot

## 2017-12-28 NOTE — Progress Notes (Signed)
ORTHO PROGRESS NOTE    Date Time: 12/28/2017 12:13 PM    Patient Name: Chelsea Montoya        Subjective:   Feels well X right shoulder weakness C5. No CP/SOB/N/V/D.  Swallowing and speaking well.  Ambulating. Pain well controlled.       Review of Systems:   A comprehensive review of systems was: General ROS: negative for - chills or fever Neuro: No new neurologic issues X right C5 palsy.      Medications:     Current Facility-Administered Medications   Medication Dose Route Frequency Provider Last Rate Last Dose   . 0.9%  NaCl infusion   Intravenous Continuous Marcelino Scot, PA 75 mL/hr at 12/26/17 2222     . albuterol (PROVENTIL HFA;VENTOLIN HFA) inhaler 2 puff  2 puff Inhalation Q4H PRN Ruthell Rummage A, PA       . benzocaine-menthol (CEPACOL) lozenge 1 lozenge  1 lozenge Buccal PRN Ruthell Rummage A, PA   1 lozenge at 12/28/17 0136   . bisacodyl (DULCOLAX) suppository 10 mg  10 mg Rectal Daily PRN Ruthell Rummage A, PA       . diazePAM (VALIUM) tablet 5 mg  5 mg Oral TID PRN Ruthell Rummage A, PA   5 mg at 12/27/17 2308   . dicyclomine (BENTYL) capsule 10 mg  10 mg Oral BID Ruthell Rummage A, PA   10 mg at 12/28/17 1015   . fluticasone furoate-vilanterol (BREO ELLIPTA) 100-25 MCG/INH 1 puff  1 puff Inhalation QAM Ruthell Rummage A, PA   1 puff at 12/28/17 1121   . HYDROmorphone (DILAUDID) injection 0.4 mg  0.4 mg Intravenous Q2H PRN Ruthell Rummage A, PA   0.4 mg at 12/27/17 0721   . lactated ringers infusion   Intravenous Continuous Hessie Diener, MD   Stopped at 12/26/17 1225   . magnesium hydroxide (MILK OF MAGNESIA) 400 MG/5ML suspension 10 mL  10 mL Oral Q4H PRN Ruthell Rummage A, PA       . melatonin tablet 10.5 mg  10.5 mg Oral QHS Croog, Mardelle Matte, MD   9 mg at 12/27/17 2307   . ondansetron (ZOFRAN-ODT) disintegrating tablet 4 mg  4 mg Oral Q8H PRN Ruthell Rummage A, PA        Or   . ondansetron (ZOFRAN) injection 4 mg  4 mg Intravenous Q8H PRN Ruthell Rummage A, PA   4 mg at  12/27/17 0203   . oxyCODONE-acetaminophen (PERCOCET) 5-325 MG per tablet 2 tablet  2 tablet Oral Q4H PRN Ruthell Rummage A, PA   2 tablet at 12/28/17 1015   . pantoprazole (PROTONIX) EC tablet 40 mg  40 mg Oral QAM Ruthell Rummage A, PA   40 mg at 12/28/17 0853   . senna-docusate (PERICOLACE) 8.6-50 MG per tablet 1 tablet  1 tablet Oral BID Ruthell Rummage A, PA   1 tablet at 12/28/17 1015   . sodium phosphate (FLEET) enema 1 enema  1 enema Rectal Once PRN Ruthell Rummage A, PA         Facility-Administered Medications Ordered in Other Encounters   Medication Dose Route Frequency Provider Last Rate Last Dose   . glucagon (rDNA) (GLUCAGEN) injection 1 mg  1 mg Intravenous Once Halina Andreas, MD             Labs:     Lab Results   Component Value Date    HGB 13.1 12/28/2017    HGB 13.3 03/16/2014  HCT 40.5 12/28/2017    WBC 14.58 (H) 12/28/2017    WBC NONE SEEN 10/04/2015    PLT 318 12/28/2017    PLT 300 03/16/2014       Radiology:  Radiology Results (24 Hour)     Procedure Component Value Units Date/Time    MRI Cervical Spine WO Contrast [[id number]] Collected:  12/27/17 1622    Order Status:  Completed Updated:  12/27/17 1631    Narrative:       CLINICAL HISTORY:  62 year old female with right deltoid weakness status  post ACDF.    TECHNIQUE: On a 1.5 Tesla system, the cervical spine was imaged in the  sagittal axial planes using T1 and T2-weighted images. No gadolinium was  used.    Comparison is made to postoperative CT examination dated 12/26/2017 as  well as preoperative MR examination dated 07/24/2017.    FINDINGS:  There are postoperative changes related to anterior cervical spinal  fusion from a left anterior approach. There is mild prevertebral edema  most conspicuous ventral to C2 and C3 level. There is extensive  susceptibility artifact related to anterior cervical spinal fusion is  thickening of ventral plate and screw fixation from C3 through C7 as  well as interbody graft placement at C3-C4, C4-C5,  C5-C6 and C6-C7.  There is no intraspinal or extraspinal hematoma identified.The  cervicomedullary junction is normal.  The cervical cord is normal in  caliber and signal intensity.   There is no evidence of ligamentous  injury.    Cervical alignment is maintained. There is no listhesis. Vertebral body  heights and marrow signal as visualized appear unremarkable. Both  vertebral arteries demonstrate normal flow-related signal loss.    Individual disc levels are as follows:    C2-C3: There is no central canal or foraminal stenosis.    C3-C4: There is disc space narrowing. There is artifact related to  interbody graft. There is a posterior disc osteophyte partially effacing  the ventral CSF slightly asymmetric towards the left. There is  left-sided uncovertebral spurring. There is mild bilateral foraminal  encroachment, left greater than right.    C4-C5: Previously identified large posterior disc osteophyte is no  longer identified. There is prominence of the ligamentum flavum which  effaces the dorsal CSF. Additionally, there is uncovertebral and facet  joint hypertrophy severe bilateral foraminal encroachment, unchanged.    C5-C6: There is no residual posterior disc protrusion. There is a disc  osteophyte slightly asymmetric towards the left. There remains  indentation of the ventral thecal sac. There is ligamentum flavum  redundancy effacing the dorsal CSF. There is mild central canal  stenosis. There is uncovertebral and facet joint hypertrophy with  moderate to severe bilateral foraminal encroachment, left greater than  right.    C6-C7: There is extensive artifact securing fine anatomic evaluation at  this level. On the sagittal sequences, there is no appreciable central  canal stenosis. There is uncovertebral and facet arthropathy. There is  severe bilateral foraminal encroachment, unchanged.     C7-T1: There is facet arthropathy. There is no central canal or  foraminal stenosis.       Impression:          1.   Postoperative changes related to recent or cervical spinal fusion  and discectomy from C3 to C7. There is no evidence of intraspinal or  extraspinal hematoma. There is no evidence of cervical cord or  ligamentous injury.    2.  Multilevel cervical spondylosis as described in detail above.  These findings were discussed with Dr. Kizzie Bane at 4:20 PM on 12/27/2017.    Neldon Mc, MD   12/27/2017 4:27 PM           Physical Exam:   Patient Vitals for the past 24 hrs:   BP Temp Temp src Pulse Resp SpO2   12/28/17 0713 158/84 97 F (36.1 C) Oral 81 19 90 %   12/28/17 0326 128/77 97.1 F (36.2 C) Oral 78 16 94 %   12/28/17 0010 151/78 97.8 F (36.6 C) Oral 89 16 96 %   12/27/17 1937 159/80 99.3 F (37.4 C) Oral (!) 101 16 93 %   12/27/17 1639 144/73 98.2 F (36.8 C) Oral 97 18 94 %     I/O last 3 completed shifts:  In: 200 [P.O.:200]  Out: 450 [Urine:400; Drains:50]      Abd soft. Calves NT. No erythema or swelling. Negative Homan's sign.  Neck:  Drsg flat, C/D/I.  Neuro: Exam stable c/w preop unless noted otherwise.             C5: Right: Motor: 2    Sensation:         Biceps reflex:                  Left:   Motor:      Sensation:         Biceps reflex:           C6: Right: Motor:      Sensation:         Brachioradialis reflex:                  Left:   Motor:      Sensation:         Brachioradialis reflex:           C7: Right: Motor:      Sensation:         Triceps reflex:                  Left:   Motor:      Sensation:         Triceps reflex:           C8: Right: Motor:      Sensation:                             Left:   Motor:      Sensation:                        T1: Right: Motor:      Sensation:                             Left:   Motor:      Sensation:         Assessment:    2 Days Post-Op s/p cervical surgery: Stable wlith MRi proven C5 palsy.  I reivewed the prognosis and PT afterward.    Plan:   Ambulate, pain control, discharge.    Signed by: Kizzie Ide  12/28/2017  12:13 PM

## 2017-12-28 NOTE — Plan of Care (Signed)
Problem: Anterior/Posterior Cervical Discectomy/Fusion  Goal: Free from infection  Outcome: Progressing   12/28/17 0118   Goal/Interventions addressed this shift   Free from infection  Monitor/assess vital signs;Assess for signs and symptoms of infection;Assess surgical dressing, reinforce or change as needed per order;Teach/reinforce use of incentive spirometer 10 times per hour while awake, cough and deep breath as needed     Goal: Hemodynamic stability  Outcome: Progressing   12/28/17 0118   Goal/Interventions addressed this shift   Hemodynamic stability Monitor/assess vital signs     Goal: Mobility/activity is maintained at optimum level for patient  Outcome: Progressing   12/28/17 0118   Goal/Interventions addressed this shift   Mobility/activity is maintained at optimum level Log roll while in bed;Instruct and apply cervical collar as needed and if ordered;Out of bed standing with assistance, prior to midnight     Goal: Patient/patient care companion demonstrates understanding on disease process, treatment plan, medications and discharge plans  Outcome: Progressing      Problem: Safety  Goal: Patient will be free from injury during hospitalization  Outcome: Progressing   12/28/17 0118   Goal/Interventions addressed this shift   Patient will be free from injury during hospitalization  Assess patient's risk for falls and implement fall prevention plan of care per policy;Provide and maintain safe environment;Use appropriate transfer methods;Ensure appropriate safety devices are available at the bedside;Include patient/ family/ care giver in decisions related to safety       Comments: Pt alert and oriented x4. Complaining of pain incision site, PRN percocet given. PRN valium also given to relief pain. Right arm weak to raise up.  Aspen collar in placed when OOB. Left dressing C,D,I. 1 assistance to bathroom. WCTM

## 2017-12-28 NOTE — Plan of Care (Signed)
Pt is alert and oriented at time of discharge. Pain is under control at discharge. Pt does have tingling and numbness on right arm which is her baseline. Right hand grip is moderate. PIV was removed. Replacement pad was given for  Aspen collar. Discharge instruction was given to pt and pt's spouse and Both verbalized understanding. Pt requested for work note, instruction given to follow up with office. Also, requested for Valium, prescription was called in to pt's pharmacy per PA. Left with husband by wheelchair.

## 2017-12-28 NOTE — Plan of Care (Signed)
Problem: Anterior/Posterior Cervical Discectomy/Fusion  Goal: Free from infection  Outcome: Progressing   12/28/17 0939   Goal/Interventions addressed this shift   Free from infection  Monitor/assess vital signs;Assess for signs and symptoms of infection     Goal: Hemodynamic stability  Outcome: Progressing   12/28/17 1610   Goal/Interventions addressed this shift   Hemodynamic stability Monitor/assess vital signs     Goal: Mobility/activity is maintained at optimum level for patient  Outcome: Progressing   12/28/17 9604   Goal/Interventions addressed this shift   Mobility/activity is maintained at optimum level Teach/review/reinforce spinal precautions with patient/patient care companion;Instruct and apply cervical collar as needed and if ordered     Goal: Pain at adequate level as identified by patient  Outcome: Progressing   12/28/17 0939   Goal/Interventions addressed this shift   Pain at adequate level as identified by patient Identify and evaluate patient comfort function goal     Goal: Patient will maintain normal GI status  Outcome: Progressing   12/28/17 5409   Goal/Interventions addressed this shift   Patient will maintain normal GI status Assess for nausea and/or vomiting. Provide pharmacological and/or non-pharmacological support as needed.     Goal: Stable neurovascular status  Outcome: Progressing   12/28/17 8119   Goal/Interventions addressed this shift   Stable neurovascular status Monitor/assess neurovascular status (pulses, capillary refill, pain, paresthesia, presence of edema)     Goal: Effective airway  Outcome: Progressing   12/28/17 0939   Goal/Interventions addressed this shift   Effective airway  Assess respiratory rate, O2 saturation, and work of breathing;Monitor/assess output from surgical drain if present     Goal: Patient/patient care companion demonstrates understanding on disease process, treatment plan, medications and discharge plans  Outcome: Progressing   12/28/17 0939    Goal/Interventions addressed this shift   Patient/patient care companion demonstrates understanding of surgery/treatment plan, medications, and discharge plans Patient received [place] pre-op spine education;Orient to unit       Problem: Safety  Goal: Patient will be free from injury during hospitalization  Outcome: Progressing   12/28/17 0939   Goal/Interventions addressed this shift   Patient will be free from injury during hospitalization  Assess patient's risk for falls and implement fall prevention plan of care per policy;Provide and maintain safe environment       Problem: Psychosocial and Spiritual Needs  Goal: Demonstrates ability to cope with hospitalization/illness  Outcome: Progressing   12/28/17 0939   Goal/Interventions addressed this shift   Demonstrates ability to cope with hospitalizations/illness Encourage verbalization of feelings/concerns/expectations;Provide quiet environment       Problem: Moderate/High Fall Risk Score >5  Goal: Patient will remain free of falls  Outcome: Progressing   12/28/17 0934 12/28/17 0939   High Risk Falls Interventions (Greater than 13)   VH High Risk (Greater than 13) --  ALL REQUIRED LOW INTERVENTIONS;ALL REQUIRED MODERATE INTERVENTIONS;Use assistive devices;Use chair-pad alarm device;Use of floor mat   OTHER   High (Greater than 13) HIGH-Initiate use of floor mats as appropriate;HIGH-Consider use of low bed;HIGH-Pharmacy to initiate evaluation and intervention per protocol;HIGH-Apply yellow "Fall Risk" arm band;HIGH-Activate bed/chair exit alarm where available --

## 2017-12-28 NOTE — OT Eval Note (Signed)
[facility]   Occupational Therapy Evaluation/Discharge    Patient: Chelsea Montoya    MRN#: [id number]   Unit: [facility] CCW GROUND UNIT  Bed: FG38/FG38.01                                     Discharge Recommendations:   Discharge Recommendation: Home with outpatient OT     Assessment:   Chelsea Montoya is a 62 y.o. female admitted 12/26/2017 POD#1 for ACDF and now with C5 palsy.  Limited shoulder flexion of the right dominant extremity is notable and OT instructed AAROM exercises for the RUE to prevent deformity and facilitate movement, compensatory techniques for adl, and instruction to husband in donning/doffing of cervical collar.  Goals met in one visit, no further acute care needs at this time.     Expanded chart review completed including review of imaging, review of H&P and physician progress notes and review of OR report.  Pt's ability to complete ADLs and functional transfers is impaired due to the following deficits: post op precautions with self care, no functional reach due to poor active shoulder flexion of the right UE which is the dominant extremity.   Pt demonstrates performance deficits with given poor active reach of the RUE There are a few comorbidities or other factors that affect plan of care and require modification of task including: recent surgery. Pt presents at/near functional baseline, independent with basic ADLs.  No acute OT needs identified. D/C acute OT services.      Therapy Diagnosis: decrease active reach of the RUE    Rehabilitation Potential: good for stated goals    Treatment Activities: Evaluation , HEP, self care compensatory technique education  Educated the patient to role of occupational therapy, plan of care, goals of therapy   Plan:   D/C acute OT services      Risks/benefits/POC discussed yes       Precautions and Contraindications:   Post op precautions  Cervical collar when OOB    Consult received for [name] for OT Evaluation and  Treatment.  Patient's medical condition is appropriate for Occupational Therapy intervention at this time.      History of Present Illness:    Chelsea Montoya is a 62 y.o. female admitted on 12/26/2017 with S/p ACDF  -Pt w/ C5 palsy.    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 Anemia     resolved   . Anxiety    . Arrhythmia     followed by cardio    . Arthritis     osteoarthritis   . Asthma without status asthmaticus     allergy-induced only- LD 09/2017   . Blood transfusion without reported diagnosis     14 years ago   . Complication of anesthesia     wake up fast   . Crohn's colitis 01/31/2012   . Crohn's disease     remission - colonoscopy    . Duplicated right renal collecting system    . Heart murmur     followed by cardio    . Hypertension     Runs 120/78's   . Insomnia    . Kidney stone    .  Mitral valve prolapse    . Pneumonia    . Pyelonephritis    . Seizures     Just once in 1978- stress induced    . Shortness of breath     R/T allergy   . Thyroid nodule     BILATERAL 09/2014; RT LOBE THYROID NODULE BX - BENIGN   . Vision abnormalities      Past Surgical History:   Procedure Laterality Date   . APPENDECTOMY  2000's   . BUNIONECTOMY Right 1998   . CLOSED REDUCTION, PERCUTANEOUS FIXATION, FINGER  10/28/2012    Procedure: CLOSED REDUCTION, PERCUTANEOUS FIXATION, FINGER;  Surgeon: Arta Bruce, MD;  Location: [facility];  Service: Orthopedics;  Laterality: Left;  Percutaneous Fixation of Left Ring Finger.     . COLECTOMY  1988    colon resection   . COLONOSCOPY W/ BIOPSIES  02/2016 2019    Dr Cinda Quest - 1 POLYP, INT HEMORRHOIDS, CROHN'S IN REMISSION - RTN 2021    . ELBOW SURGERY Right 2010   . ENDOMETRIAL BIOPSY  04/22/2014    RARE SUPERFICIAL ENDOMETRIAL GLANDS AMIDST MUCUS.TISSUE  IS INSUFFICIENT FOR THE OPTIMAL EVALUATION OF THE  ENDOMETRIAL CAVITY.   Marland Kitchen GYNECOLOGIC CRYOSURGERY  1970's   . KNEE ARTHROPLASTY Right 2001, 2016   . KNEE ARTHROPLASTY Left 2009   . THYROID NODULE BIOPSY  09/2014    RT LOBE THYROID NODULE       Imaging/Tests/Labs:  Mri Cervical Spine Wo Contrast    Result Date: 12/27/2017   1.  Postoperative changes related to recent or cervical spinal fusion and discectomy from C3 to C7. There is no evidence of intraspinal or extraspinal hematoma. There is no evidence of cervical cord or ligamentous injury. 2.  Multilevel cervical spondylosis as described in detail above. These findings were discussed with Dr. Kizzie Bane at 4:20 PM on 12/27/2017. Neldon Mc, MD 12/27/2017 4:27 PM      Social History:   Prior Level of Function:  independent  Will be returning home with husband for support    Subjective: I cannot move the right shoulder    Patient is agreeable to participation in the therapy session. Nursing clears patient for therapy.     Patient Goal: to be able to move my right arm and shoulder  Pain:   Scale: 3/10  Location: right shoulder with active movement  Intervention: education    Objective:   Patient  With cervical collar in place    Cognitive Status and Neuro Exam:  Alert, oriented x 3    Musculoskeletal Examination  Gross ROM: WFL except RUE  Gross Strength: WFL except RUE    Therapeutic exercise:  Instructed in HEP to be performed in bed to  facilitate movement of the right UE, shoulder flexion exercises with assist from the LUE      Sensory/Oculomotor Examination  Auditory: WFL  Tactile: WFL  Vision: WFL      Activities of Daily Living  Eating: Independent  Grooming: Independent  Bathing: Independent  UE Dressing: Independent- instructed husband in donning/doffing and care of cervical collar  LE Dressing: instructed in allowable reaching for LB dressing  Toileting: Independent    Functional Mobility:  Supine to Sit: Independent using post op precautions  Sit to Stand:  Independent  Transfers: Independent     PMP - Progressive Mobility Protocol   PMP Activity: Step 7 - Walks out of Room  Distance Walked (ft) (Step 6,7): 100 Feet  Balance  No issue with balance in stance or sit  Participation and Activity Tolerance  Participation Effort: excellent  Endurance: excellent    Patient left with call bell within reach, all needs met,  and all questions answered. RN notified of session outcome and patient response.     Goals:  Time For Goal Achievement: 1 visit  ADL Goals  Other Goal: Enhance facilitation of movement (Pt demonstrate post op precautions: MET)        Musculoskeletal Goals  Pt will perform Home Exercise Program: independent     MA Seyfried-Clemmons, OTR/L  [id number]    Time of treatment:   OT Received On: 12/28/17  Start Time: 1015  Stop Time: 1101  Time Calculation (min): 46 min

## 2017-12-28 NOTE — Progress Notes (Signed)
Spine navigator spoke to patient regarding plan of care, pain management, IS teaching, spine precautions per Dr. Kizzie Bane, ambulation, NO NSAID teaching, medication teaching, deep breathing and [place] planning. Patient states her pain is well controlled, tolerating diet, understands spine precautions, ambulating, voiding, understands home incision teaching an  planning. Spoke to bedside RN regarding plan of care. No further questions

## 2017-12-31 NOTE — Discharge Summary (Signed)
DISCHARGE NOTE    Date Time: 12/31/17 7:46 AM  Patient Name: Chelsea Montoya  Attending Physician: No att. 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 HNP (herniated nucleus pulposus) with myelopathy, cervical      Hospital Problems:  Active Problems:    HNP (herniated nucleus pulposus) with myelopathy, cervical      Problem Lists:  Patient Active Problem List   Diagnosis   . Crohn's colitis   . History of colon resection   . Mitral valve prolapse   . Pyelonephritis   . Kidney stone   . Arrhythmia   . PGM breast cancer age 89   . paternal aunts x 2 ovarian cancer   . Prolapse of anterior vaginal wall   . HNP (herniated nucleus pulposus) with myelopathy, cervical       Active Multi-disciplinary problems  Active Multi-Disciplinary problems:   Anterior/Posterior Cervical Discectomy/Fusion (12/17/17)  Assessment/Plan    Safety (12/17/17)  Assessment/Plan    Psychosocial and Spiritual Needs (12/17/17)  Assessment/Plan    Moderate/High Fall Risk Score >5 (12/26/17)  Assessment/Plan    Occupational Therapy (12/28/17)  Assessment/Plan              Discharge Dx:   HNP    Consultations:   None    Procedures performed:   ACDF C3-7    Hospital Course:   The patient presented for an elective ACDF C3-7.  Operative course was unremarkable.  The patient was transferred to the PACU in good condition, where she recovered well.   The patient was neurologically intact in the PACU.  She was transferred to the floor, where she continued to recover.  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  Stat MRI was unremarkable.  The patient was given a 24 hr course of IV steroids.  She was discharged on post op  day # 2 with aggressive physical therapy.        Discharge Medications:     Discharge Medication List as of 12/28/2017  1:08 PM      CONTINUE these medications which have NOT CHANGED    Details   dicyclomine (BENTYL) 20 MG tablet TAKE 1/2 TABLET BY MOUTH THREE TIMES A DAY AS NEEDED, Historical Med      Loratadine-Pseudoephedrine (CLARITIN-D 12 HOUR PO) Take by mouth., Until Discontinued, Historical Med      Omeprazole (PRILOSEC PO) Take by mouth Dose unknown, Historical Med      oxyCODONE-acetaminophen (PERCOCET) 5-325 MG per tablet Take by mouth., Starting Tue 03/28/2016, Historical Med      albuterol (PROAIR HFA) 108 (90 Base) MCG/ACT inhaler USE 1-2 PUFFS EVERY 4 HOURS AS NEEDED, Historical Med      fluticasone-salmeterol (ADVAIR DISKUS) 250-50 MCG/DOSE Aerosol Powder, Breath Activtivatede as needed, Historical Med      Furosemide (LASIX PO) Take by mouth as needed Dose unknown- for swelling in legs no CHF, Historical Med      WELLBUTRIN XL 300 MG 24 hr tablet TAKE ONE TABLET BY MOUTH EVERY MORNING, Historical Med         STOP taking these medications  ALPRAZolam (XANAX) 0.5 MG tablet                Discharge Instructions:   No dressing change or new bandage needed. You may shower, no bathing or pools. No NSAIDs (ibuprofen, advil, motrin, aleve, diclofenac) if you had a spinal fusion. Regular diet. Take over the counter stool softener daily while on pain medicines. Call office with problems [phone].    Signed by: Marcelino Scot

## 2017-12-31 NOTE — Hospital Course (Signed)
The patient presented for an elective ACDF C3-7.  Operative course was unremarkable.  The patient was transferred to the PACU in good condition, where she recovered well.   The patient was neurologically intact in the PACU.  She was transferred to the floor, where she continued to recover.  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  Stat MRI was unremarkable.  The patient was given a 24 hr course of IV steroids.  She was discharged on post op day # 2 with aggressive physical therapy.

## 2018-01-01 ENCOUNTER — Encounter
Admission: EM | Disposition: A | Discharge status: Home Health | Payer: Self-pay | Source: Home / Self Care | Attending: Orthopaedic Surgery of the Spine

## 2018-01-01 ENCOUNTER — Inpatient Hospital Stay: Payer: BLUE CROSS/BLUE SHIELD

## 2018-01-01 ENCOUNTER — Inpatient Hospital Stay: Payer: BLUE CROSS/BLUE SHIELD | Admitting: Anesthesiology

## 2018-01-01 ENCOUNTER — Inpatient Hospital Stay
Admission: EM | Admit: 2018-01-01 | Discharge: 2018-01-05 | DRG: 857 | Disposition: A | Discharge status: Home Health | Payer: BLUE CROSS/BLUE SHIELD | Attending: Orthopaedic Surgery of the Spine | Admitting: Orthopaedic Surgery of the Spine

## 2018-01-01 ENCOUNTER — Emergency Department: Payer: BLUE CROSS/BLUE SHIELD

## 2018-01-01 ENCOUNTER — Inpatient Hospital Stay: Admission: AD | Admit: 2018-01-01 | Payer: Self-pay | Source: Ambulatory Visit | Admitting: Orthopaedic Surgery

## 2018-01-01 DIAGNOSIS — G4700 Insomnia, unspecified: Secondary | ICD-10-CM | POA: Diagnosis present

## 2018-01-01 DIAGNOSIS — Z91040 Latex allergy status: Secondary | ICD-10-CM

## 2018-01-01 DIAGNOSIS — Z981 Arthrodesis status: Secondary | ICD-10-CM

## 2018-01-01 DIAGNOSIS — T8141XA Infection following a procedure, superficial incisional surgical site, initial encounter: Secondary | ICD-10-CM | POA: Diagnosis present

## 2018-01-01 DIAGNOSIS — E041 Nontoxic single thyroid nodule: Secondary | ICD-10-CM | POA: Diagnosis present

## 2018-01-01 DIAGNOSIS — T8142XA Infection following a procedure, deep incisional surgical site, initial encounter: Principal | ICD-10-CM | POA: Diagnosis present

## 2018-01-01 DIAGNOSIS — Y831 Surgical operation with implant of artificial internal device as the cause of abnormal reaction of the patient, or of later complication, without mention of misadventure at the time of the procedure: Secondary | ICD-10-CM | POA: Diagnosis present

## 2018-01-01 DIAGNOSIS — Q638 Other specified congenital malformations of kidney: Secondary | ICD-10-CM

## 2018-01-01 DIAGNOSIS — I10 Essential (primary) hypertension: Secondary | ICD-10-CM | POA: Diagnosis present

## 2018-01-01 DIAGNOSIS — I341 Nonrheumatic mitral (valve) prolapse: Secondary | ICD-10-CM | POA: Diagnosis present

## 2018-01-01 DIAGNOSIS — Z96653 Presence of artificial knee joint, bilateral: Secondary | ICD-10-CM | POA: Diagnosis present

## 2018-01-01 DIAGNOSIS — Z881 Allergy status to other antibiotic agents status: Secondary | ICD-10-CM

## 2018-01-01 DIAGNOSIS — R5082 Postprocedural fever: Secondary | ICD-10-CM | POA: Diagnosis present

## 2018-01-01 DIAGNOSIS — R509 Fever, unspecified: Secondary | ICD-10-CM

## 2018-01-01 DIAGNOSIS — J45909 Unspecified asthma, uncomplicated: Secondary | ICD-10-CM | POA: Diagnosis present

## 2018-01-01 DIAGNOSIS — Z9049 Acquired absence of other specified parts of digestive tract: Secondary | ICD-10-CM

## 2018-01-01 DIAGNOSIS — M1990 Unspecified osteoarthritis, unspecified site: Secondary | ICD-10-CM | POA: Diagnosis present

## 2018-01-01 DIAGNOSIS — D72829 Elevated white blood cell count, unspecified: Secondary | ICD-10-CM

## 2018-01-01 DIAGNOSIS — R339 Retention of urine, unspecified: Secondary | ICD-10-CM | POA: Diagnosis not present

## 2018-01-01 DIAGNOSIS — I499 Cardiac arrhythmia, unspecified: Secondary | ICD-10-CM | POA: Diagnosis present

## 2018-01-01 DIAGNOSIS — Z7951 Long term (current) use of inhaled steroids: Secondary | ICD-10-CM

## 2018-01-01 DIAGNOSIS — G509 Disorder of trigeminal nerve, unspecified: Secondary | ICD-10-CM | POA: Diagnosis present

## 2018-01-01 DIAGNOSIS — Z9889 Other specified postprocedural states: Secondary | ICD-10-CM | POA: Insufficient documentation

## 2018-01-01 DIAGNOSIS — Z885 Allergy status to narcotic agent status: Secondary | ICD-10-CM

## 2018-01-01 DIAGNOSIS — T8149XA Infection following a procedure, other surgical site, initial encounter: Secondary | ICD-10-CM

## 2018-01-01 DIAGNOSIS — R32 Unspecified urinary incontinence: Secondary | ICD-10-CM | POA: Diagnosis present

## 2018-01-01 DIAGNOSIS — F419 Anxiety disorder, unspecified: Secondary | ICD-10-CM | POA: Diagnosis present

## 2018-01-01 DIAGNOSIS — G588 Other specified mononeuropathies: Secondary | ICD-10-CM | POA: Diagnosis present

## 2018-01-01 DIAGNOSIS — K5010 Crohn's disease of large intestine without complications: Secondary | ICD-10-CM | POA: Diagnosis present

## 2018-01-01 LAB — CBC AND DIFFERENTIAL
Absolute NRBC: 0 10*3/uL (ref 0.00–0.00)
Basophils Absolute Automated: 0.06 10*3/uL (ref 0.00–0.08)
Basophils Automated: 0.4 %
Eosinophils Absolute Automated: 0.17 10*3/uL (ref 0.00–0.44)
Eosinophils Automated: 1 %
Hematocrit: 44 % — ABNORMAL HIGH (ref 34.7–43.7)
Hgb: 14.4 g/dL (ref 11.4–14.8)
Immature Granulocytes Absolute: 0.09 10*3/uL — ABNORMAL HIGH (ref 0.00–0.07)
Immature Granulocytes: 0.5 %
Lymphocytes Absolute Automated: 2.61 10*3/uL (ref 0.42–3.22)
Lymphocytes Automated: 15.3 %
MCH: 28.9 pg (ref 25.1–33.5)
MCHC: 32.7 g/dL (ref 31.5–35.8)
MCV: 88.4 fL (ref 78.0–96.0)
MPV: 8.8 fL — ABNORMAL LOW (ref 8.9–12.5)
Monocytes Absolute Automated: 0.91 10*3/uL — ABNORMAL HIGH (ref 0.21–0.85)
Monocytes: 5.3 %
Neutrophils Absolute: 13.17 10*3/uL — ABNORMAL HIGH (ref 1.10–6.33)
Neutrophils: 77.5 %
Nucleated RBC: 0 /100 WBC (ref 0.0–0.0)
Platelets: 342 10*3/uL (ref 142–346)
RBC: 4.98 10*6/uL (ref 3.90–5.10)
RDW: 13 % (ref 11–15)
WBC: 17.01 10*3/uL — ABNORMAL HIGH (ref 3.10–9.50)

## 2018-01-01 LAB — COMPREHENSIVE METABOLIC PANEL
ALT: 13 U/L (ref 0–55)
AST (SGOT): 18 U/L (ref 5–34)
Albumin/Globulin Ratio: 0.8 — ABNORMAL LOW (ref 0.9–2.2)
Albumin: 3.1 g/dL — ABNORMAL LOW (ref 3.5–5.0)
Alkaline Phosphatase: 71 U/L (ref 37–106)
BUN: 11 mg/dL (ref 7.0–19.0)
Bilirubin, Total: 0.4 mg/dL (ref 0.2–1.2)
CO2: 26 mEq/L (ref 22–29)
Calcium: 9.5 mg/dL (ref 8.5–10.5)
Chloride: 100 mEq/L (ref 100–111)
Creatinine: 0.7 mg/dL (ref 0.6–1.0)
Globulin: 4 g/dL — ABNORMAL HIGH (ref 2.0–3.6)
Glucose: 133 mg/dL — ABNORMAL HIGH (ref 70–100)
Potassium: 4.7 mEq/L (ref 3.5–5.1)
Protein, Total: 7.1 g/dL (ref 6.0–8.3)
Sodium: 135 mEq/L — ABNORMAL LOW (ref 136–145)

## 2018-01-01 LAB — SEDIMENTATION RATE: Sed Rate: 30 mm/Hr — ABNORMAL HIGH (ref 0–20)

## 2018-01-01 LAB — ECG 12-LEAD
Atrial Rate: 99 {beats}/min
P Axis: -8 degrees
P-R Interval: 144 ms
Q-T Interval: 350 ms
QRS Duration: 74 ms
QTC Calculation (Bezet): 449 ms
R Axis: 33 degrees
T Axis: -19 degrees
Ventricular Rate: 99 {beats}/min

## 2018-01-01 LAB — I-STAT CG4 VENOUS CARTRIDGE
Lactic Acid I-Stat: 1.1 mmol/L (ref 0.2–2.0)
i-STAT Base Excess Venous: 4 mEq/L
i-STAT FIO2: 21
i-STAT HCO3 Bicarbonate Venous: 28.6 mEq/L
i-STAT O2 Saturation Venous: 67 %
i-STAT Patient Temperature: 100.2
i-STAT Total CO2 Venous: 30 mEq/L
i-STAT pCO2 Venous: 41.5
i-STAT pH Venous: 7.449
i-STAT pO2 Venous: 35

## 2018-01-01 LAB — TYPE AND SCREEN
AB Screen Gel: NEGATIVE
ABO Rh: A POS

## 2018-01-01 LAB — C-REACTIVE PROTEIN: C-Reactive Protein: 7.6 mg/dL — ABNORMAL HIGH (ref 0.0–0.8)

## 2018-01-01 LAB — GFR: EGFR: >60

## 2018-01-01 SURGERY — INCISION AND DRAINAGE, COMPLEX, POST OPERATIVE WOUND INFECTION
Anesthesia: Anesthesia General | Site: Neck | Wound class: Contaminated

## 2018-01-01 MED ORDER — FENTANYL CITRATE (PF) 50 MCG/ML IJ SOLN (WRAP)
25.0000 ug | INTRAMUSCULAR | Status: AC | PRN
Start: 2018-01-01 — End: 2018-01-01
  Administered 2018-01-01 (×3): 25 ug via INTRAVENOUS

## 2018-01-01 MED ORDER — EPHEDRINE SULFATE 50 MG/ML IJ/IV SOLN (WRAP)
Status: DC | PRN
Start: 2018-01-01 — End: 2018-01-01
  Administered 2018-01-01 (×3): 10 mg via INTRAVENOUS
  Administered 2018-01-01 (×2): 5 mg via INTRAVENOUS

## 2018-01-01 MED ORDER — GLYCOPYRROLATE 0.2 MG/ML IJ SOLN
INTRAMUSCULAR | Status: AC
Start: 2018-01-01 — End: ?
  Filled 2018-01-01: qty 4

## 2018-01-01 MED ORDER — CETIRIZINE HCL 10 MG PO TABS
10.00 mg | ORAL_TABLET | Freq: Every day | ORAL | Status: DC
Start: 2018-01-01 — End: 2018-01-05
  Administered 2018-01-02 – 2018-01-04 (×3): 10 mg via ORAL
  Filled 2018-01-01 (×3): qty 1

## 2018-01-01 MED ORDER — FAMOTIDINE 10 MG/ML IV SOLN (WRAP)
INTRAVENOUS | Status: DC | PRN
Start: 2018-01-01 — End: 2018-01-01
  Administered 2018-01-01: 20 mg via INTRAVENOUS

## 2018-01-01 MED ORDER — SODIUM CHLORIDE 0.9% BAG (IRRIGATION USE)
INTRAVENOUS | Status: DC | PRN
Start: 2018-01-01 — End: 2018-01-01
  Administered 2018-01-01: 2000 mL

## 2018-01-01 MED ORDER — HYDROMORPHONE HCL 0.5 MG/0.5 ML IJ SOLN
0.5000 mg | Freq: Once | INTRAMUSCULAR | Status: DC
Start: 2018-01-01 — End: 2018-01-01

## 2018-01-01 MED ORDER — LIDOCAINE HCL 2 % IJ SOLN
INTRAMUSCULAR | Status: DC | PRN
Start: 2018-01-01 — End: 2018-01-01
  Administered 2018-01-01: 40 mg via INTRAVENOUS

## 2018-01-01 MED ORDER — HYDROMORPHONE HCL 0.5 MG/0.5 ML IJ SOLN
0.40 mg | INTRAMUSCULAR | Status: AC | PRN
Start: 2018-01-01 — End: 2018-01-03
  Administered 2018-01-02 – 2018-01-03 (×4): 0.4 mg via INTRAVENOUS
  Filled 2018-01-01 (×5): qty 1

## 2018-01-01 MED ORDER — VANCOMYCIN HCL IN NACL 1.5-0.9 GM/500ML-% IV SOLN
1500.00 mg | Freq: Two times a day (BID) | INTRAVENOUS | Status: DC
Start: 2018-01-01 — End: 2018-01-04
  Administered 2018-01-01: 18:00:00 1.5 g via INTRAVENOUS
  Administered 2018-01-02 – 2018-01-04 (×5): 1500 mg via INTRAVENOUS
  Filled 2018-01-01 (×7): qty 500

## 2018-01-01 MED ORDER — PANTOPRAZOLE SODIUM 40 MG IV SOLR
40.0000 mg | Freq: Two times a day (BID) | INTRAVENOUS | Status: DC | PRN
Start: 2018-01-01 — End: 2018-01-05

## 2018-01-01 MED ORDER — HYDROMORPHONE HCL 1 MG/ML IJ SOLN
INTRAMUSCULAR | Status: AC
Start: 2018-01-01 — End: 2018-01-01
  Administered 2018-01-01: 21:00:00 0.5 mg via INTRAVENOUS
  Filled 2018-01-01: qty 1

## 2018-01-01 MED ORDER — OXYCODONE HCL 5 MG PO TABS
5.0000 mg | ORAL_TABLET | Freq: Once | ORAL | Status: AC | PRN
Start: 2018-01-01 — End: 2018-01-01

## 2018-01-01 MED ORDER — GADOBUTROL 1 MMOL/ML IV SOLN
10.0000 mL | Freq: Once | INTRAVENOUS | Status: DC | PRN
Start: 2018-01-01 — End: 2018-01-05

## 2018-01-01 MED ORDER — GABAPENTIN 300 MG PO CAPS
ORAL_CAPSULE | ORAL | Status: AC
Start: 2018-01-01 — End: ?
  Filled 2018-01-01: qty 1

## 2018-01-01 MED ORDER — SODIUM CHLORIDE 0.9 % IV BOLUS
1000.00 mL | Freq: Once | INTRAVENOUS | Status: AC
Start: 2018-01-01 — End: 2018-01-01
  Administered 2018-01-01: 13:00:00 1000 mL via INTRAVENOUS

## 2018-01-01 MED ORDER — HYDROMORPHONE HCL 1 MG/ML IJ SOLN
1.0000 mg | Freq: Once | INTRAMUSCULAR | Status: DC
Start: 2018-01-01 — End: 2018-01-01

## 2018-01-01 MED ORDER — ROCURONIUM BROMIDE 50 MG/5ML IV SOLN
INTRAVENOUS | Status: AC
Start: 2018-01-01 — End: ?
  Filled 2018-01-01: qty 5

## 2018-01-01 MED ORDER — GLYCOPYRROLATE 0.2 MG/ML IJ SOLN
INTRAMUSCULAR | Status: DC | PRN
Start: 2018-01-01 — End: 2018-01-01
  Administered 2018-01-01: .9 mg via INTRAVENOUS
  Administered 2018-01-01: 0.1 mg via INTRAVENOUS

## 2018-01-01 MED ORDER — METOCLOPRAMIDE HCL 5 MG/ML IJ SOLN
10.0000 mg | Freq: Once | INTRAMUSCULAR | Status: DC | PRN
Start: 2018-01-01 — End: 2018-01-01

## 2018-01-01 MED ORDER — OXYCODONE-ACETAMINOPHEN 5-325 MG PO TABS
1.0000 | ORAL_TABLET | Freq: Four times a day (QID) | ORAL | Status: DC | PRN
Start: 2018-01-01 — End: 2018-01-01

## 2018-01-01 MED ORDER — FENTANYL CITRATE (PF) 50 MCG/ML IJ SOLN (WRAP)
INTRAMUSCULAR | Status: DC | PRN
Start: 2018-01-01 — End: 2018-01-01
  Administered 2018-01-01: 50 ug via INTRAVENOUS

## 2018-01-01 MED ORDER — EPHEDRINE SULFATE 50 MG/ML IJ/IV SOLN (WRAP)
Status: AC
Start: 2018-01-01 — End: ?
  Filled 2018-01-01: qty 1

## 2018-01-01 MED ORDER — DICYCLOMINE HCL 10 MG PO CAPS
10.0000 mg | ORAL_CAPSULE | Freq: Two times a day (BID) | ORAL | Status: DC
Start: 2018-01-02 — End: 2018-01-05
  Administered 2018-01-02 – 2018-01-05 (×7): 10 mg via ORAL
  Filled 2018-01-01 (×9): qty 1

## 2018-01-01 MED ORDER — PROPOFOL 10 MG/ML IV EMUL (WRAP)
INTRAVENOUS | Status: AC
Start: 2018-01-01 — End: ?
  Filled 2018-01-01: qty 20

## 2018-01-01 MED ORDER — OXYCODONE-ACETAMINOPHEN 5-325 MG PO TABS
2.00 | ORAL_TABLET | ORAL | Status: DC | PRN
Start: 2018-01-01 — End: 2018-01-05
  Administered 2018-01-02 – 2018-01-05 (×16): 2 via ORAL
  Filled 2018-01-01 (×16): qty 2

## 2018-01-01 MED ORDER — LACTATED RINGERS IV SOLN
75.0000 mL/h | INTRAVENOUS | Status: DC
Start: 2018-01-01 — End: 2018-01-01

## 2018-01-01 MED ORDER — HYDROMORPHONE HCL 0.5 MG/0.5 ML IJ SOLN
0.5000 mg | INTRAMUSCULAR | Status: DC | PRN
Start: 2018-01-01 — End: 2018-01-01
  Administered 2018-01-01: 0.5 mg via INTRAVENOUS

## 2018-01-01 MED ORDER — GADOBUTROL 1 MMOL/ML IV SOLN
10.00 mL | Freq: Once | INTRAVENOUS | Status: AC | PRN
Start: 2018-01-01 — End: 2018-01-01
  Administered 2018-01-01: 16:00:00 10 mmol via INTRAVENOUS
  Filled 2018-01-01: qty 10

## 2018-01-01 MED ORDER — HYDROMORPHONE HCL 1 MG/ML IJ SOLN
1.00 mg | Freq: Once | INTRAMUSCULAR | Status: AC
Start: 2018-01-01 — End: 2018-01-01
  Administered 2018-01-01: 16:00:00 1 mg via INTRAVENOUS
  Filled 2018-01-01: qty 1

## 2018-01-01 MED ORDER — LACTATED RINGERS IV SOLN
INTRAVENOUS | Status: DC | PRN
Start: 2018-01-01 — End: 2018-01-01

## 2018-01-01 MED ORDER — ONDANSETRON HCL 4 MG/2ML IJ SOLN
INTRAMUSCULAR | Status: AC
Start: 2018-01-01 — End: 2018-01-01
  Administered 2018-01-01: 21:00:00 4 mg via INTRAVENOUS
  Filled 2018-01-01: qty 2

## 2018-01-01 MED ORDER — PHENYLEPHRINE 100 MCG/ML IN NACL 0.9% IV SOSY
PREFILLED_SYRINGE | INTRAVENOUS | Status: DC | PRN
Start: 2018-01-01 — End: 2018-01-01
  Administered 2018-01-01 (×5): 100 ug via INTRAVENOUS

## 2018-01-01 MED ORDER — LIDOCAINE HCL (PF) 2 % IJ SOLN
INTRAMUSCULAR | Status: AC
Start: 2018-01-01 — End: ?
  Filled 2018-01-01: qty 5

## 2018-01-01 MED ORDER — MEPERIDINE HCL 25 MG/ML IJ SOLN
25.0000 mg | INTRAMUSCULAR | Status: DC | PRN
Start: 2018-01-01 — End: 2018-01-01

## 2018-01-01 MED ORDER — ROCURONIUM BROMIDE 10 MG/ML IV SOLN (WRAP)
INTRAVENOUS | Status: DC | PRN
Start: 2018-01-01 — End: 2018-01-01
  Administered 2018-01-01: 25 mg via INTRAVENOUS

## 2018-01-01 MED ORDER — GLYCOPYRROLATE 0.2 MG/ML IJ SOLN
INTRAMUSCULAR | Status: AC
Start: 2018-01-01 — End: ?
  Filled 2018-01-01: qty 1

## 2018-01-01 MED ORDER — SODIUM CHLORIDE 0.9 % IV SOLN
INTRAVENOUS | Status: DC
Start: 2018-01-01 — End: 2018-01-05

## 2018-01-01 MED ORDER — BACITRACIN 50000 UNITS IM SOLR
INTRAMUSCULAR | Status: DC | PRN
Start: 2018-01-01 — End: 2018-01-01
  Administered 2018-01-01: 50000 [IU]

## 2018-01-01 MED ORDER — ACETAMINOPHEN 500 MG PO TABS
ORAL_TABLET | ORAL | Status: AC
Start: 2018-01-01 — End: ?
  Filled 2018-01-01: qty 2

## 2018-01-01 MED ORDER — BACITRACIN 50000 UNITS IM SOLR
INTRAMUSCULAR | Status: AC
Start: 2018-01-01 — End: 2018-01-02
  Filled 2018-01-01: qty 100000

## 2018-01-01 MED ORDER — NON FORMULARY
10.0000 mg | Freq: Two times a day (BID) | Status: DC
Start: 2018-01-01 — End: 2018-01-01

## 2018-01-01 MED ORDER — ONDANSETRON HCL 4 MG/2ML IJ SOLN
INTRAMUSCULAR | Status: AC
Start: 2018-01-01 — End: ?
  Filled 2018-01-01: qty 2

## 2018-01-01 MED ORDER — ONDANSETRON HCL 4 MG/2ML IJ SOLN
4.0000 mg | Freq: Once | INTRAMUSCULAR | Status: AC | PRN
Start: 2018-01-01 — End: 2018-01-01

## 2018-01-01 MED ORDER — FUROSEMIDE 20 MG PO TABS
10.0000 mg | ORAL_TABLET | Freq: Two times a day (BID) | ORAL | Status: DC
Start: 2018-01-02 — End: 2018-01-05
  Administered 2018-01-02 – 2018-01-05 (×7): 10 mg via ORAL
  Filled 2018-01-01 (×7): qty 1

## 2018-01-01 MED ORDER — GABAPENTIN 300 MG PO CAPS
300.0000 mg | ORAL_CAPSULE | Freq: Once | ORAL | Status: AC
Start: 2018-01-01 — End: 2018-01-01
  Administered 2018-01-01: 17:00:00 300 mg via ORAL

## 2018-01-01 MED ORDER — NEOSTIGMINE METHYLSULFATE 1 MG/ML IJ/IV SOLN (WRAP)
Status: DC | PRN
Start: 2018-01-01 — End: 2018-01-01
  Administered 2018-01-01: 5 mg via INTRAVENOUS

## 2018-01-01 MED ORDER — THROMBIN 5000 UNITS EX SOLR
CUTANEOUS | Status: AC
Start: 2018-01-01 — End: 2018-01-02
  Filled 2018-01-01: qty 15000

## 2018-01-01 MED ORDER — OXYCODONE HCL 5 MG PO TABS
ORAL_TABLET | ORAL | Status: AC
Start: 2018-01-01 — End: 2018-01-01
  Administered 2018-01-01: 21:00:00 5 mg via ORAL
  Filled 2018-01-01: qty 1

## 2018-01-01 MED ORDER — ACETAMINOPHEN 500 MG PO TABS
1000.00 mg | ORAL_TABLET | Freq: Once | ORAL | Status: AC
Start: 2018-01-01 — End: 2018-01-01
  Administered 2018-01-01: 17:00:00 1000 mg via ORAL

## 2018-01-01 MED ORDER — MIDAZOLAM HCL 2 MG/2ML IJ SOLN
INTRAMUSCULAR | Status: DC | PRN
Start: 2018-01-01 — End: 2018-01-01
  Administered 2018-01-01: 2 mg via INTRAVENOUS

## 2018-01-01 MED ORDER — FENTANYL CITRATE (PF) 50 MCG/ML IJ SOLN (WRAP)
INTRAMUSCULAR | Status: AC
Start: 2018-01-01 — End: 2018-01-01
  Administered 2018-01-01: 20:00:00 25 ug via INTRAVENOUS
  Filled 2018-01-01: qty 2

## 2018-01-01 MED ORDER — SODIUM CHLORIDE 0.9 % IV MBP
2.00 g | Freq: Three times a day (TID) | INTRAVENOUS | Status: DC
Start: 2018-01-01 — End: 2018-01-05
  Administered 2018-01-01 – 2018-01-05 (×12): 2 g via INTRAVENOUS
  Filled 2018-01-01 (×15): qty 2

## 2018-01-01 MED ORDER — OXYCODONE-ACETAMINOPHEN 5-325 MG PO TABS
1.00 | ORAL_TABLET | ORAL | Status: DC | PRN
Start: 2018-01-01 — End: 2018-01-05
  Filled 2018-01-01: qty 1

## 2018-01-01 MED ORDER — FLUTICASONE FUROATE-VILANTEROL 100-25 MCG/INH IN AEPB
1.0000 | INHALATION_SPRAY | Freq: Every morning | RESPIRATORY_TRACT | Status: DC
Start: 2018-01-02 — End: 2018-01-05
  Administered 2018-01-02 – 2018-01-05 (×4): 1 via RESPIRATORY_TRACT
  Filled 2018-01-01: qty 14

## 2018-01-01 MED ORDER — CEFEPIME HCL 2 G IJ SOLR
INTRAMUSCULAR | Status: AC
Start: 2018-01-01 — End: ?
  Filled 2018-01-01: qty 2

## 2018-01-01 MED ORDER — SODIUM CHLORIDE 0.9 % IV SOLN
1000.0000 mg | Freq: Two times a day (BID) | INTRAVENOUS | Status: DC
Start: 2018-01-01 — End: 2018-01-01

## 2018-01-01 MED ORDER — BISACODYL 10 MG RE SUPP
10.0000 mg | Freq: Every day | RECTAL | Status: DC | PRN
Start: 2018-01-01 — End: 2018-01-05
  Filled 2018-01-01: qty 1

## 2018-01-01 MED ORDER — BENZOCAINE-MENTHOL 15-3.6 MG MT LOZG
1.0000 | LOZENGE | OROMUCOSAL | Status: DC | PRN
Start: 2018-01-01 — End: 2018-01-05
  Administered 2018-01-05: 1 via BUCCAL
  Filled 2018-01-01 (×2): qty 1

## 2018-01-01 MED ORDER — MIDAZOLAM HCL 2 MG/2ML IJ SOLN
INTRAMUSCULAR | Status: AC
Start: 2018-01-01 — End: ?
  Filled 2018-01-01: qty 2

## 2018-01-01 MED ORDER — SENNOSIDES-DOCUSATE SODIUM 8.6-50 MG PO TABS
1.0000 | ORAL_TABLET | Freq: Two times a day (BID) | ORAL | Status: DC
Start: 2018-01-01 — End: 2018-01-05
  Administered 2018-01-02 – 2018-01-05 (×7): 1 via ORAL
  Filled 2018-01-01 (×7): qty 1

## 2018-01-01 MED ORDER — SODIUM CHLORIDE 0.9 % IV BOLUS
1000.0000 mL | Freq: Once | INTRAVENOUS | Status: AC
Start: 2018-01-01 — End: 2018-01-02
  Administered 2018-01-01: 1000 mL via INTRAVENOUS

## 2018-01-01 MED ORDER — PROPOFOL 10 MG/ML IV EMUL (WRAP)
INTRAVENOUS | Status: DC | PRN
Start: 2018-01-01 — End: 2018-01-01
  Administered 2018-01-01: 150 mg via INTRAVENOUS

## 2018-01-01 MED ORDER — HYDRALAZINE HCL 20 MG/ML IJ SOLN
10.0000 mg | INTRAMUSCULAR | Status: DC | PRN
Start: 2018-01-01 — End: 2018-01-01

## 2018-01-01 MED ORDER — NEOSTIGMINE METHYLSULFATE 1 MG/ML IJ/IV SOLN (WRAP)
Status: AC
Start: 2018-01-01 — End: ?
  Filled 2018-01-01: qty 5

## 2018-01-01 MED ORDER — ALBUTEROL SULFATE HFA 108 (90 BASE) MCG/ACT IN AERS
2.0000 | INHALATION_SPRAY | Freq: Three times a day (TID) | RESPIRATORY_TRACT | Status: DC
Start: 2018-01-01 — End: 2018-01-05
  Administered 2018-01-02 – 2018-01-05 (×9): 2 via RESPIRATORY_TRACT
  Filled 2018-01-01: qty 1

## 2018-01-01 MED ORDER — HYDROMORPHONE HCL 0.5 MG/0.5 ML IJ SOLN
0.50 mg | Freq: Once | INTRAMUSCULAR | Status: AC
Start: 2018-01-01 — End: 2018-01-01
  Administered 2018-01-01: 14:00:00 0.5 mg via INTRAVENOUS

## 2018-01-01 MED ORDER — MAGNESIUM HYDROXIDE 400 MG/5ML PO SUSP
10.0000 mL | ORAL | Status: DC | PRN
Start: 2018-01-01 — End: 2018-01-05
  Administered 2018-01-03 – 2018-01-04 (×3): 10 mL via ORAL
  Filled 2018-01-01 (×3): qty 30

## 2018-01-01 MED ORDER — ALBUMIN HUMAN 5 % IV SOLN
INTRAVENOUS | Status: DC | PRN
Start: 2018-01-01 — End: 2018-01-01

## 2018-01-01 MED ORDER — LACTATED RINGERS IV BOLUS
500.0000 mL | INTRAVENOUS | Status: DC | PRN
Start: 2018-01-01 — End: 2018-01-01

## 2018-01-01 MED ORDER — FAMOTIDINE 20 MG/2ML IV SOLN
INTRAVENOUS | Status: AC
Start: 2018-01-01 — End: ?
  Filled 2018-01-01: qty 2

## 2018-01-01 MED ORDER — BUPROPION HCL ER (XL) 300 MG PO TB24
300.0000 mg | ORAL_TABLET | Freq: Every morning | ORAL | Status: DC
Start: 2018-01-02 — End: 2018-01-05
  Administered 2018-01-02 – 2018-01-05 (×4): 300 mg via ORAL
  Filled 2018-01-01 (×4): qty 1

## 2018-01-01 MED ORDER — BACITRACIN 50000 UNITS IM SOLR
INTRAMUSCULAR | Status: AC
Start: 2018-01-01 — End: 2018-01-02
  Filled 2018-01-01: qty 50000

## 2018-01-01 MED ORDER — ONDANSETRON HCL 4 MG/2ML IJ SOLN
INTRAMUSCULAR | Status: DC | PRN
Start: 2018-01-01 — End: 2018-01-01
  Administered 2018-01-01: 4 mg via INTRAVENOUS

## 2018-01-01 MED ORDER — FLEET ENEMA 7-19 GM/118ML RE ENEM
1.0000 | ENEMA | Freq: Once | RECTAL | Status: DC | PRN
Start: 2018-01-01 — End: 2018-01-05

## 2018-01-01 MED ORDER — HYDROMORPHONE HCL 0.5 MG/0.5 ML IJ SOLN
0.50 mg | Freq: Once | INTRAMUSCULAR | Status: AC
Start: 2018-01-01 — End: 2018-01-01
  Administered 2018-01-01: 14:00:00 0.5 mg via INTRAVENOUS
  Filled 2018-01-01: qty 1

## 2018-01-01 SURGICAL SUPPLY — 7 items
ADHESIVE SKIN CLOSURE DERMABOND ADVANCED (Skin Closure) ×1
ADHESIVE SKIN CLOSURE DERMABOND ADVANCED .7 ML LIQUID APPLICATOR (Skin Closure) ×1 IMPLANT
ADHESIVE SKNCLS 2 OCTYL CYNCRLT .7ML (Skin Closure) ×1
SPONGE GAUZE L4 IN X W4 IN 16 PLY (Dressing) ×2
SPONGE GAUZE L4 IN X W4 IN 16 PLY MAXIMUM ABSORBENT USP TYPE VII (Dressing) ×2 IMPLANT
SPONGE GZE CTTN CRTY 4X4IN LF NS 16 PLY (Dressing) ×2
TEGADERM IV ADVANCED 3.5 X 4.5 (Dressing) ×2 IMPLANT

## 2018-01-01 NOTE — Progress Notes (Signed)
On-call paged regarding duplicate dilaudid orders. Awaiting response.

## 2018-01-01 NOTE — ED Notes (Addendum)
Patient is wheeled to 42 from triage. Patient is AAOX4, speaking in complete sentences. Patient reports being sent from PCP for further evaluation of swelling and redness to neck incision. Patient reports C3-7 fusion last week. Patient reports onset of R arm weakness beginning yesterday. Patient reports last night having bladder incontinence, with no hx of urinary incontinence. Patient denies HA, SOB, chest pain, lower back pain, numbness/tingling. Patient reports low grade fever today.     Patient is warm and dry. Cap refill <2 seconds. Respirations even and unlabored. Lungs CTA. Radial pulses 2+ bilaterally. Abdomen is soft and non-tender. Patient has incision to L neck that appears red and patient reports tenderness to incision. Airway intact.

## 2018-01-01 NOTE — Progress Notes (Signed)
Patient sound asleep in bed. Resting comfortably. VSS. WCTM.

## 2018-01-01 NOTE — Anesthesia Preprocedure Evaluation (Addendum)
Anesthesia Evaluation    AIRWAY    Mallampati: III    TM distance: >3 FB  Neck ROM: limited  Mouth Opening:full   CARDIOVASCULAR    normal       DENTAL        (+) upper dentures and lower dentures   PULMONARY    clear to auscultation     OTHER FINDINGS                Relevant Problems   (+) Arrhythmia   (+) Kidney stone   (+) Mitral valve prolapse               Anesthesia Plan    ASA 3     general                     intravenous induction   Detailed anesthesia plan: general endotracheal        Post op pain management: per surgeon    informed consent obtained    Plan discussed with CRNA.    ECG reviewed  pertinent labs reviewed             Signed by: Lovey Newcomer 01/01/18 4:37 PM

## 2018-01-01 NOTE — Transfer of Care (Signed)
Anesthesia Transfer of Care Note    Patient: Chelsea Montoya    Procedures performed: Procedure(s):  INCISION & DRAINAGE, WOUND    Anesthesia type: General ETT    Patient location:PACU    Last vitals:   Vitals:    01/01/18 1915   BP: 148/70   Pulse: 93   Resp: 16   Temp: 36.7 C (98 F)   SpO2: 100%       Post pain: Patient not complaining of pain, continue current therapy      Mental Status:awake    Respiratory Function: tolerating face mask    Cardiovascular: stable    Nausea/Vomiting: patient not complaining of nausea or vomiting    Hydration Status: adequate    Post assessment: no apparent anesthetic complications and no reportable events    Signed by: Jamison Neighbor  01/01/18 7:16 PM

## 2018-01-01 NOTE — ED Notes (Signed)
Patient in MRI at this time. Respirations even and unlabored. Patient awake and alert. NAD noted.

## 2018-01-01 NOTE — Op Note (Signed)
FULL OPERATIVE NOTE    Date Time: 01/01/18 6:36 PM  Patient Name: Chelsea Montoya, Chelsea Montoya  Attending Physician: No att. providers found      Date of Operation:   01/01/2018    Providers Performing:   Surgeon(s):  Kizzie Ide, MD    Circulator: Reed Pandy, RN  Scrub Person: Donaciano Eva    Operative Procedure:   Procedure(s):  INCISION & DRAINAGE, WOUND    Preoperative Diagnosis:   Pre-Op Diagnosis Codes:     * S/P cervical discectomy,     Postoperative Diagnosis:   Post-Op Diagnosis Codes:     * S/P cervical discectomy [Z98.890]    Indications:   This 62 year old presented today to the office with wound swelling and erythema, elevated WBC.  Risks/limitations, benefits and alternatives were thoroughly discussed preoperatively.    Operative Notes:   DESCRIPTION OF PROCEDURE:  The patient was brought awake and alert from the preanesthesia area to the operating room.  The patient had an adequate general endotracheal tube anesthetic induced.  The patient was then rolled supine on the  table and all pressure points carefully padded.   Utilizing a straight anterior incision, sharp, and blunt dissection and Bovie hemostasis down to the superficial fascia was performed.  The retractors were positioned.  A collection of purulent material was noted suprafascial and debridement and pulsevac Bacitracin irrigation was performed until all surfaces were clean.  Cultures were obtained from this collection.  Then the fascia was opened and the deep wound cultured, debrided and pulsevaced as well.  some purulence was noted there.  No evidence of CSF leak noted.  The procedure was then ceased with the wound closed over a large hemovac drain in the usual fashion.  There were no intraoperative complications.  Urine output, blood loss, and crystalloids per the anesthetic note.  Prognosis overall is excellent for improvement in this patient's symptoms.  DrJain was consulted from ID to manage this problem with me.  Foley was placed after.   She has a known right C5 palsy.    Estimated Blood Loss:   * No values recorded between 01/01/2018  5:47 PM and 01/01/2018  6:36 PM *    Implants:   * No implants in log *    Drains:   Drains: yes JP 7mm flat    Specimens:   * No specimens in log *      Complications:   none    Signed by: Kizzie Ide, MD

## 2018-01-01 NOTE — Consults (Signed)
ID CONSULTATION      Spectra: [phone]    Office: [phone]    Date Time: 01/01/18 2:17 PM  Patient Name: Chelsea Montoya  Requesting Physician: Rudi Rummage, MD     Reason for Consultation:     Surgical site wound infection    Problem List:   Acute Problem List:   Fever  Neck pain  -- 01/01/18 Cervical CT -  progressive edema/fluid involving the operative bed involving the cervical soft tissues on the left, with progressive prevertebral soft tissue swelling diffusely  -- blood cx pending     Herniated nucleus pulposus   -- s/p 12/17/17 - Anterior/Posterior Cervical Discectomy/Fusion      Chronic Conditions:  Crohn's  Mitral valve prolapse  Anxiety   Asthma     All: cipro (rash)   Assessment:     63 yo female recently anterior/posterir cervical fusion/discectomy   Coming in for concern for surgical site wound infection    Imaging showing fluid collection at surgical site    Surgery planned for today    Temp 100.8    Noted GI upset with Keflex - not true allergy     Antimicrobials:     Start Cefepime 2g IV q8  Start Vancomycin 1500mg  IV q12    Recommendations:     Start Cefepime and Vancomycin - to be given pre-operatively and continue after surgery  Intra-op wound cx to be sent  Follow blood cx  Check Vanc trough prior to 4th dose    Discussed with Neurosurgery team   ______________________________________________________________________  I have discussed my thoughts with the patient and with relevant medical team members I have considered the potential drug interactions between antimicrobial agents   I have recommended and other medications required by the patient and adjusted appropriately for renal function   Thank you for allowing me to participate in the care of this very interesting and pleasant patient    History:   Chelsea Montoya is a 62 y.o. female who presents to the hospital on 01/01/2018 with fever and neck pain.  Sent in from Neurosurgery office for concern for surgical site wound infection.   She had a anterior/posterior discectomy and fusion done Late June.   Temp 100.8 in ER    Past Medical History:     Past Medical History:   Diagnosis Date   . Anemia     resolved   . Anxiety    . Arrhythmia     followed by cardio    . Arthritis     osteoarthritis   . Asthma without status asthmaticus     allergy-induced only- LD 09/2017   . Blood transfusion without reported diagnosis     14 years ago   . Complication of anesthesia     wake up fast   . Crohn's colitis 01/31/2012   . Crohn's disease     remission - colonoscopy    . Duplicated right renal collecting system    . Heart murmur     followed by cardio    . Hypertension     Runs 120/78's   . Insomnia    . Kidney stone    . Mitral valve prolapse    . Pneumonia    . Pyelonephritis    . Seizures     Just once in 1978- stress induced    . Shortness of breath     R/T allergy   . Thyroid nodule     BILATERAL  09/2014; RT LOBE THYROID NODULE BX - BENIGN   . Vision abnormalities        Past Surgical History:     Past Surgical History:   Procedure Laterality Date   . APPENDECTOMY  2000's   . BUNIONECTOMY Right 1998   . CLOSED REDUCTION, PERCUTANEOUS FIXATION, FINGER  10/28/2012    Procedure: CLOSED REDUCTION, PERCUTANEOUS FIXATION, FINGER;  Surgeon: Arta Bruce, MD;  Location: [facility];  Service: Orthopedics;  Laterality: Left;  Percutaneous Fixation of Left Ring Finger.     . COLECTOMY  1988    colon resection   . COLONOSCOPY W/ BIOPSIES  02/2016 2019    Dr Cinda Quest - 1 POLYP, INT HEMORRHOIDS, CROHN'S IN REMISSION - RTN 2021    . ELBOW SURGERY Right 2010   . ENDOMETRIAL BIOPSY  04/22/2014    RARE SUPERFICIAL ENDOMETRIAL GLANDS AMIDST MUCUS.TISSUE IS INSUFFICIENT FOR THE OPTIMAL EVALUATION OF THE  ENDOMETRIAL CAVITY.   Lavenia Atlas, ANTERIOR CERVICAL, LEVELS 3+ N/A 12/26/2017    Procedure: FUSION, ANTERIOR CERVICAL, LEVELS 3+;  Surgeon: Kizzie Ide, MD;  Location: [facility];  Service: Orthopedics;  Laterality: N/A;  C3-C7 ANTERIOR CERVICAL DISCECTOMY &  FUSION   . GYNECOLOGIC CRYOSURGERY  1970's   . KNEE ARTHROPLASTY Right 2001, 2016   . KNEE ARTHROPLASTY Left 2009   . THYROID NODULE BIOPSY  09/2014    RT LOBE THYROID NODULE       Family History:     Family History   Problem Relation Age of Onset   . Ovarian cancer Paternal Aunt 60        died of complications of ovarian ca   . Lung cancer Mother         complications of lung ca   . Kidney failure Father    . Ovarian cancer Paternal Aunt         survivor   . Uterine cancer Maternal Grandmother 50        survivior   . Breast cancer Paternal Grandmother    . Colon cancer Neg Hx        Social History:     Social History     Social History   . Marital status: Married     Spouse name: N/A   . Number of children: N/A   . Years of education: N/A     Social History Main Topics   . Smoking status: Never Smoker   . Smokeless tobacco: Never Used   . Alcohol use Yes      Comment: occasionally   . Drug use: No   . Sexual activity: Yes     Partners: Male     Birth control/ protection: None     Other Topics Concern   . Not on file     Social History Narrative   . No narrative on file       Allergies:     Allergies   Allergen Reactions   . Flexeril [Cyclobenzaprine] Nausea And Vomiting     Flu like symptoms   . Augmentin [Amoxicillin-Pot Clavulanate] Nausea And Vomiting   . Darvon Other (See Comments)     Gastric distress     . Keflex [Cephalexin] Other (See Comments)     Gastric upset.   . Latex    . Ciprofloxacin Rash       Lines:     piv    Medications:     Current Facility-Administered Medications   Medication Dose  Route Frequency   . acetaminophen  1,000 mg Oral Once       Review of Systems:   General ROS: negative for - chills, fevers, night sweats, weight loss   HEENT: negative for - blurry vision, sore throat, thrush   Respiratory ROS: negative for cough, SOB  Cardiovascular ROS: negative for - chest pain, palpitations   Gastrointestinal ROS: negative for - abdominal pain, nausea, vomiting, diarrhea  Genito-Urinary ROS:  negative for - dysuria, urinary frequency/urgency   Musculoskeletal ROS: negative for - joint pain, joint stiffness or muscle pain   Dermatological ROS: negative for - rash and skin lesion changes   Neurological ROS: negative for - confusion, headache, dizziness  Hematological ROS: negative for - bruising, bleeding   Psychological ROS: negative for - changes in mood      Physical Exam:     Vitals:    01/01/18 1307   BP: 126/64   Pulse: 97   Resp:    Temp:    SpO2: 94%       General Appearance: alert, on way to MRI   Neuro: alert, oriented, normal speech, normal attention and cognition    HEENT: no scleral icterus,   Neck: supple,  Abdomen: soft,   Extremities: no pedal edema  Skin: no rash    Labs:     Lab Results   Component Value Date    WBC 17.01 (H) 01/01/2018    HGB 14.4 01/01/2018    HCT 44.0 (H) 01/01/2018    MCV 88.4 01/01/2018    PLT 342 01/01/2018     Lab Results   Component Value Date    CREAT 0.7 01/01/2018     Lab Results   Component Value Date    ALT 13 01/01/2018    AST 18 01/01/2018    ALKPHOS 71 01/01/2018    BILITOTAL 0.4 01/01/2018     No results found for: LACTATE    Microbiology:     Microbiology Results     Procedure Component Value Units Date/Time    Blood Culture Aerobic/Anaerobic #1 [[id number]] Collected:  01/01/18 1254    Specimen:  Arm from Blood, Intravenous Line Updated:  01/01/18 1254    Narrative:       1 BLUE+1 PURPLE    Blood Culture Aerobic/Anaerobic #2 [[id number]] Collected:  01/01/18 1303    Specimen:  Arm from Blood, Venipuncture Updated:  01/01/18 1303    Narrative:       1 BLUE+1 PURPLE          Rads:   Ct Cervical Spine Without Contrast    Result Date: 01/01/2018  1. Again identified are postsurgical changes status post ACDF C3-C7. There is no evidence of hardware failure. 2. There is evidence of progressive edema/fluid involving the operative bed involving the cervical soft tissues on the left, with progressive prevertebral soft tissue swelling diffusely. Neck CT, with  contrast, may be helpful for further characterization as clinically warranted. Findings were discussed with Dr. Laurie Panda at approximately 1:50 PM on January 01, 2018. Nicoletta Dress, MD 01/01/2018 1:55 PM      Signed by: Claiborne Rigg, MD

## 2018-01-01 NOTE — ED Notes (Signed)
Pt to CT via stretcher

## 2018-01-01 NOTE — ED Provider Notes (Signed)
[facility] EMERGENCY DEPARTMENT RESIDENT H&P       CLINICAL INFORMATION        HPI:      Chief Complaint: No chief complaint on file.  Chelsea Montoya is a 62 y.o. female who presents with possible surgical site infection s/p ACDF C3-7 performed on 7/3 and discharged on 7/5. Pt states that she was experiencing nausea and fevers on 7/8, so she went saw Dr. Kizzie Bane in his office on 7/9 who sent her to the ED. Pt states that her incision site has been tender, but has not noticed any purulent drainage. Pt states that she has hoarseness in her voice and trouble swallowing which has been normal for her in her post-op course.     History obtained from: patient             ROS:      Review of Systems   Constitutional: Positive for fever.   HENT: Positive for trouble swallowing.    Eyes: Negative.    Respiratory: Negative.    Cardiovascular: Negative.    Gastrointestinal: Positive for nausea.   Endocrine: Negative.    Genitourinary: Negative.    Musculoskeletal: Positive for neck pain and neck stiffness.   Skin:        Surgical incision along lateral aspect of left side of neck   Allergic/Immunologic: Negative.    Neurological: Negative.    Hematological: Negative.    Psychiatric/Behavioral: Negative.          Physical Exam:      Pulse (!) 104  BP 119/58  Resp 22  SpO2 94 %  Temp (!) 100.8 F (38.2 C)    Physical Exam   Constitutional: She is oriented to person, place, and time. She appears well-developed and well-nourished.   HENT:   Head: Normocephalic and atraumatic.   Eyes: Pupils are equal, round, and reactive to light. EOM are normal.   Neck:   Reduced neck ROM s/p C3-7 ACDF   Cardiovascular: Normal rate, regular rhythm, normal heart sounds and intact distal pulses.    Pulmonary/Chest: Effort normal and breath sounds normal.   Abdominal: Soft. Bowel sounds are normal.   Musculoskeletal: Normal range of motion. She exhibits no deformity.   Neurological: She is alert and oriented to person,  place, and time.   Skin: Skin is warm and dry.   Sx incision along the lateral aspect of the left side of the neck s/p ACDF C3-C7; incision is well coapted; erythema present along the distal aspect of the incision; skin surrounding the incision is warm to the touch compared to the contralateral side. There is fibrotic tissue at the distal aspect of the incision, but no signs of purulent drainage. TTP along the incision, no crepitus or fluctuance present.    Psychiatric: She has a normal mood and affect. Her behavior is normal.               PAST HISTORY        Primary Care Provider: Dante Gang, MD        PMH/PSH:    .     Past Medical History:   Diagnosis Date   . Anemia     resolved   . Anxiety    . Arrhythmia     followed by cardio    . Arthritis     osteoarthritis   . Asthma without status asthmaticus     allergy-induced only- LD 09/2017   .  Blood transfusion without reported diagnosis     14 years ago   . Complication of anesthesia     wake up fast   . Crohn's colitis 01/31/2012   . Crohn's disease     remission - colonoscopy    . Duplicated right renal collecting system    . Heart murmur     followed by cardio    . Hypertension     Runs 120/78's   . Insomnia    . Kidney stone    . Mitral valve prolapse    . Pneumonia    . Pyelonephritis    . Seizures     Just once in 1978- stress induced    . Shortness of breath     R/T allergy   . Thyroid nodule     BILATERAL 09/2014; RT LOBE THYROID NODULE BX - BENIGN   . Vision abnormalities        She has a past surgical history that includes Appendectomy (2000's); Gynecologic cryosurgery (1970's); Colectomy (1988); Bunionectomy (Right, 1998); CLOSED REDUCTION, PERCUTANEOUS FIXATION, FINGER (10/28/2012); Colonoscopy w/ biopsies (02/2016 2019); Endometrial biopsy (04/22/2014); THYROID NODULE BIOPSY (09/2014); Knee Arthroplasty (Right, 2001, 2016); Knee Arthroplasty (Left, 2009); Elbow surgery (Right, 2010); and FUSION, ANTERIOR CERVICAL, LEVELS 3+ (N/A, 12/26/2017).       Social/Family History:      She reports that she has never smoked. She has never used smokeless tobacco. She reports that she drinks alcohol. She reports that she does not use drugs.    Family History   Problem Relation Age of Onset   . Ovarian cancer Paternal Aunt 60        died of complications of ovarian ca   . Lung cancer Mother         complications of lung ca   . Kidney failure Father    . Ovarian cancer Paternal Aunt         survivor   . Uterine cancer Maternal Grandmother 50        survivior   . Breast cancer Paternal Grandmother    . Colon cancer Neg Hx          Listed Medications on Arrival:    .     Home Medications             albuterol (PROAIR HFA) 108 (90 Base) MCG/ACT inhaler     USE 1-2 PUFFS EVERY 4 HOURS AS NEEDED     dicyclomine (BENTYL) 20 MG tablet     TAKE 1/2 TABLET BY MOUTH THREE TIMES A DAY AS NEEDED     fluticasone-salmeterol (ADVAIR DISKUS) 250-50 MCG/DOSE Aerosol Powder, Breath Activtivatede     as needed     Furosemide (LASIX PO)     Take by mouth as needed Dose unknown- for swelling in legs no CHF     Loratadine-Pseudoephedrine (CLARITIN-D 12 HOUR PO)     Take by mouth.     Omeprazole (PRILOSEC PO)     Take by mouth Dose unknown     oxyCODONE-acetaminophen (PERCOCET) 5-325 MG per tablet     Take by mouth.     WELLBUTRIN XL 300 MG 24 hr tablet     TAKE ONE TABLET BY MOUTH EVERY MORNING         Allergies: She is allergic to flexeril [cyclobenzaprine]; augmentin [amoxicillin-pot clavulanate]; darvon; keflex [cephalexin]; latex; and ciprofloxacin.            VISIT INFORMATION  Reassessments/Clinical Course:            Conversations with Other Providers:              Medications Given in the ED:    .     ED Medication Orders     Start Ordered     Status Ordering Provider    01/01/18 1210 01/01/18 1209  sodium chloride 0.9 % bolus 1,000 mL  Once     Route: Intravenous  Ordered Dose: 1,000 mL     Ordered BENGHANEM, GHOFRANE            Procedures:      Procedures      Assessment/Plan:     -Pt presents to ED s/p ACDF C3-C7 w/ possible surgical incision infection. Pt to be admitted.   -Pt to remain NPO per Dr. Kizzie Bane  -ID consulted: will see pt in ED  -CT spine ordered: Again identified are postsurgical changes status post ACDF C3-C7. There is no evidence of hardware failure. There is evidence of progressive edema/fluid involving the operative bed involving the cervical soft tissues on the left, with progressive prevertebral soft tissue swelling diffusely. Neck CT, with contrast, may be helpful for further characterization as clinically warranted.  -MRI c-spine ordered: results pending   -MRI t-spine ordered: results pending   -1L NSS    WBC: 17.01        ESR: 30         CRP: 7.6           Ferne Coe, DPM  Resident  01/01/18 [phone]

## 2018-01-01 NOTE — ED Provider Notes (Signed)
[facility] EMERGENCY DEPARTMENT H&P                                             ATTENDING SUPERVISORY NOTE      Visit date: 01/01/2018      CLINICAL SUMMARY          Diagnosis:    .     Final diagnoses:   Surgical site infection   Fever postop         MDM Notes:    59F h/o ACDF C3-7 done 12/26/17 who presents with concern for surgical site infection to site for C7 anterior cervical discectomy and fusion. Was seen at Dr. Vonzell Schlatter office today from where she was sent to the ER for further management. Noted to have fever.    Discussed case w/ Dr. Kizzie Bane, requesting CT c-spine as well MRI of C and T spine for plan of operative wash out today.  Hold off on antibiotics at this time.  ID to be consulted.    Blood cultures ordered.   MRI:   IMPRESSION:    1. There is a new large fluid collection involving the prevertebral  cervical soft tissues which is suspicious for potential abscess in this  setting. There is no appreciable involvement of the spinal canal.  2. Multilevel degenerative thoracic spondylosis.  4:59 PM  Dr. Kizzie Bane in ED and pt dispo to OR.          Disposition:         Inpatient Admit      ED Disposition     ED Disposition Condition Date/Time Comment    Send to OR  Tue Jan 01, 2018  4:43 PM Dr. Kizzie Bane (ortho-spine) accepting                      CLINICAL INFORMATION        HPI:      Chief Complaint: Post-op Problem  .    Chelsea Montoya is a 62 y.o. female h/o ACDF C3-7 done 12/26/17 who presents with concern for surgical site infection to site for C7 anterior cervical discectomy and fusion. Was seen at Dr. Vonzell Schlatter office today from where she was sent to the ER for further management. Noted to have fever.    History obtained from: Patient      ROS:      Positive and negative ROS elements as per HPI.  All other systems reviewed and negative.      Physical Exam:      Pulse (!) 104  BP 119/58  Resp 22  SpO2 94 %  Temp (!) 100.8 F (38.2 C)    Physical Exam   Constitutional: She is  oriented to person, place, and time. She appears well-developed and well-nourished.   HENT:   Head: Normocephalic and atraumatic.   Hoarse voice   Eyes: Pupils are equal, round, and reactive to light. EOM are normal.   Neck: Normal range of motion. Neck supple.       Cardiovascular: Normal rate and regular rhythm.    Pulmonary/Chest: Effort normal and breath sounds normal. No respiratory distress.   Abdominal: Soft. Bowel sounds are normal.   Musculoskeletal: She exhibits no edema or tenderness.   Unable to elevate LUE. Sensation preserved.   Neurological: She is alert and oriented to person, place, and time.  Skin: Skin is warm and dry.   Psychiatric: She has a normal mood and affect. Her behavior is normal.   Nursing note and vitals reviewed.                PAST HISTORY        Primary Care Provider: Dante Gang, MD        PMH/PSH:    .     Past Medical History:   Diagnosis Date   . Anemia     resolved   . Anxiety    . Arrhythmia     followed by cardio    . Arthritis     osteoarthritis   . Asthma without status asthmaticus     allergy-induced only- LD 09/2017   . Blood transfusion without reported diagnosis     14 years ago   . Complication of anesthesia     wake up fast   . Crohn's colitis 01/31/2012   . Crohn's disease     remission - colonoscopy    . Duplicated right renal collecting system    . Heart murmur     followed by cardio    . Hypertension     Runs 120/78's   . Insomnia    . Kidney stone    . Mitral valve prolapse    . Pneumonia    . Pyelonephritis    . Seizures     Just once in 1978- stress induced    . Shortness of breath     R/T allergy   . Thyroid nodule     BILATERAL 09/2014; RT LOBE THYROID NODULE BX - BENIGN   . Vision abnormalities        She has a past surgical history that includes Appendectomy (2000's); Gynecologic cryosurgery (1970's); Colectomy (1988); Bunionectomy (Right, 1998); CLOSED REDUCTION, PERCUTANEOUS FIXATION, FINGER (10/28/2012); Colonoscopy w/ biopsies (02/2016 2019);  Endometrial biopsy (04/22/2014); THYROID NODULE BIOPSY (09/2014); Knee Arthroplasty (Right, 2001, 2016); Knee Arthroplasty (Left, 2009); Elbow surgery (Right, 2010); and FUSION, ANTERIOR CERVICAL, LEVELS 3+ (N/A, 12/26/2017).      Social/Family History:      She reports that she has never smoked. She has never used smokeless tobacco. She reports that she drinks alcohol. She reports that she does not use drugs.    Family History   Problem Relation Age of Onset   . Ovarian cancer Paternal Aunt 60        died of complications of ovarian ca   . Lung cancer Mother         complications of lung ca   . Kidney failure Father    . Ovarian cancer Paternal Aunt         survivor   . Uterine cancer Maternal Grandmother 50        survivior   . Breast cancer Paternal Grandmother    . Colon cancer Neg Hx          Listed Medications on Arrival:    .     Home Medications     Med List Status:  In Progress Set By: Eli Hose, RN at 01/01/2018 12:32 PM                albuterol (PROAIR HFA) 108 (90 Base) MCG/ACT inhaler     USE 1-2 PUFFS EVERY 4 HOURS AS NEEDED     dicyclomine (BENTYL) 20 MG tablet     TAKE 1/2 TABLET BY MOUTH THREE TIMES A DAY AS NEEDED  fluticasone-salmeterol (ADVAIR DISKUS) 250-50 MCG/DOSE Aerosol Powder, Breath Activtivatede     as needed     Furosemide (LASIX PO)     Take by mouth as needed Dose unknown- for swelling in legs no CHF     Loratadine-Pseudoephedrine (CLARITIN-Chelsea 12 HOUR PO)     Take by mouth.     Omeprazole (PRILOSEC PO)     Take by mouth Dose unknown     oxyCODONE-acetaminophen (PERCOCET) 5-325 MG per tablet     Take by mouth.     WELLBUTRIN XL 300 MG 24 hr tablet     TAKE ONE TABLET BY MOUTH EVERY MORNING          Allergies: She is allergic to flexeril [cyclobenzaprine]; augmentin [amoxicillin-pot clavulanate]; darvon; keflex [cephalexin]; latex; and ciprofloxacin.            VISIT INFORMATION        Clinical Course in the ED:      12:36 PM - Spoke with Dr. Ashok Cordia and discussed antibiotics.  Dr. Kizzie Bane said to hold off and to contact infectious disease. Infectious Disease will consult.    3:12 PM Dr. Kizzie Bane was updated w/ CT result and MRI orders. Sts he will continue to follow the case.               Medications Given in the ED:    .     ED Medication Orders     Start Ordered     Status Ordering Provider    01/01/18 1508 01/01/18 1508  HYDROmorphone (DILAUDID) injection 1 mg  Once     Route: Intravenous  Ordered Dose: 1 mg     Last MAR action:  Given Carleigh Buccieri S    01/01/18 1427 01/01/18 1426  HYDROmorphone (DILAUDID) injection 0.5 mg  Once     Route: Intravenous  Ordered Dose: 0.5 mg     Last MAR action:  Given Toy Samarin S    01/01/18 1339 01/01/18 1339  HYDROmorphone (DILAUDID) injection 0.5 mg  Once     Route: Intravenous  Ordered Dose: 0.5 mg     Last MAR action:  Given STELLAR, DEVRIE E    01/01/18 1234 01/01/18 1233  acetaminophen (TYLENOL) tablet 1,000 mg  Once     Route: Oral  Ordered Dose: 1,000 mg     Last MAR action:  Hold Katelynn Heidler S    01/01/18 1210 01/01/18 1209  sodium chloride 0.9 % bolus 1,000 mL  Once     Route: Intravenous  Ordered Dose: 1,000 mL     Last MAR action:  New Bag BENGHANEM, GHOFRANE            Procedures:      Procedures      Interpretations:      O2 sat-           saturation: 95 %; Oxygen use: room air; Interpretation: Normal    EKG -             interpreted by me: normal sinus at 99.  Normal axis, no ST elevation or depression. TWI anteriorly. TWI inferiorly. S1, Q3, and T3 pattern which are pronoucned than seen on prior EKG done 05/07/2015.               RESULTS        Lab Results:      Results     Procedure Component Value Units Date/Time    Blood Culture Aerobic/Anaerobic #1 [[id number]] Collected:  01/01/18 1254    Specimen:  Arm from Blood, Intravenous Line Updated:  01/01/18 1456    Narrative:       1 BLUE+1 PURPLE    Blood Culture Aerobic/Anaerobic #2 [[id number]] Collected:  01/01/18 1303    Specimen:  Arm from Blood, Venipuncture Updated:   01/01/18 1456    Narrative:       1 BLUE+1 PURPLE    Sedimentation rate (ESR) [[id number]]  (Abnormal) Collected:  01/01/18 1254    Specimen:  Blood Updated:  01/01/18 1419     Sed Rate 30 (H) mm/Hr     Type and Screen [[id number]] Collected:  01/01/18 1303    Specimen:  Blood Updated:  01/01/18 1413     ABO Rh A POS     AB Screen Gel NEG    Comprehensive metabolic panel [[id number]]  (Abnormal) Collected:  01/01/18 1253    Specimen:  Blood Updated:  01/01/18 1353     Glucose 133 (H) mg/dL      BUN 62.9 mg/dL      Creatinine 0.7 mg/dL      Sodium 528 (L) mEq/L      Potassium 4.7 mEq/L      Chloride 100 mEq/L      CO2 26 mEq/L      Calcium 9.5 mg/dL      Protein, Total 7.1 g/dL      Albumin 3.1 (L) g/dL      AST (SGOT) 18 U/L      ALT 13 U/L      Alkaline Phosphatase 71 U/L      Bilirubin, Total 0.4 mg/dL      Globulin 4.0 (H) g/dL      Albumin/Globulin Ratio 0.8 (L)    C Reactive Protein [[id number]]  (Abnormal) Collected:  01/01/18 1253    Specimen:  Blood Updated:  01/01/18 1353     C-Reactive Protein 7.6 (H) mg/dL     GFR [[id number]] Collected:  01/01/18 1253     Updated:  01/01/18 1353     EGFR >60.0    CBC with differential [[id number]]  (Abnormal) Collected:  01/01/18 1254    Specimen:  Blood from Blood Updated:  01/01/18 1333     WBC 17.01 (H) x10 3/uL      Hgb 14.4 g/dL      Hematocrit 63.8 (H) %      Platelets 342 x10 3/uL      RBC 4.98 x10 6/uL      MCV 88.4 fL      MCH 28.9 pg      MCHC 32.7 g/dL      RDW 13 %      MPV 8.8 (L) fL      Neutrophils 77.5 %      Lymphocytes Automated 15.3 %      Monocytes 5.3 %      Eosinophils Automated 1.0 %      Basophils Automated 0.4 %      Immature Granulocyte 0.5 %      Nucleated RBC 0.0 /100 WBC      Neutrophils Absolute 13.17 (H) x10 3/uL      Abs Lymph Automated 2.61 x10 3/uL      Abs Mono Automated 0.91 (H) x10 3/uL      Abs Eos Automated 0.17 x10 3/uL      Absolute Baso Automated 0.06 x10 3/uL      Absolute Immature Granulocyte 0.09 (H) x10 3/uL      Absolute NRBC  0.00  x10 3/uL     i-Stat CG4 Venous CartrIDge [[id number]] Collected:  01/01/18 1254     Updated:  01/01/18 1306     i-STAT pH Venous 7.449     i-STAT pCO2 Venous 41.5     i-STAT pO2 Venous 35.0     i-STAT HCO3 Bicarbonate Venous 28.6 mEq/L      i-STAT Total CO2 Venous 30.0 mEq/L      i-STAT Base Excess Venous 4.0 mEq/L      i-STAT O2 Saturation Venous 67.0 %      i-STAT Lactic acid 1.1 mmol/L      i-STAT Patient Temperature 100.2 F     i-STAT FIO2 21     i-STAT O2 Delivery Room Air     i-STAT Allen's Test NA     i-STAT ECMO No     i-STAT Draw Site Venous              Radiology Results:      MRI Cervical Spine W WO Contrast   Final Result       1. There is a new large fluid collection involving the prevertebral   cervical soft tissues which is suspicious for potential abscess in this   setting. There is no appreciable involvement of the spinal canal.   2. Multilevel degenerative thoracic spondylosis.      Nicoletta Dress, MD    01/01/2018 4:41 PM      MRI Thoracic Spine W WO Contrast   Final Result       1. There is a new large fluid collection involving the prevertebral   cervical soft tissues which is suspicious for potential abscess in this   setting. There is no appreciable involvement of the spinal canal.   2. Multilevel degenerative thoracic spondylosis.      Nicoletta Dress, MD    01/01/2018 4:41 PM      CT Cervical Spine without Contrast   Final Result         1. Again identified are postsurgical changes status post ACDF C3-C7.   There is no evidence of hardware failure.    2. There is evidence of progressive edema/fluid involving the operative   bed involving the cervical soft tissues on the left, with progressive   prevertebral soft tissue swelling diffusely. Neck CT, with contrast, may   be helpful for further characterization as clinically warranted.      Findings were discussed with Dr. Laurie Panda at approximately 1:50   PM on January 01, 2018.      Nicoletta Dress, MD    01/01/2018 1:55 PM                  Supervisory  Statements:      I have reviewed and agree with the history except as noted above. The pertinent physical exam has been documented.  I have reviewed and agree with the final ED diagnosis.      Scribe Attestation:      I was acting as a Neurosurgeon for Rudi Rummage, MD on [facility] [place]  Treatment Team: Scribe: Delice Lesch    I am the first attending provider for this patient and I personally performed the services documented. Treatment Team: Scribe: Delice Lesch is scribing for me on [facility] [place]. This note accurately reflects work and decisions made by me.  Rudi Rummage, MD  Rudi Rummage, MD  01/01/18 1700

## 2018-01-01 NOTE — Anesthesia Postprocedure Evaluation (Signed)
Anesthesia Post Evaluation    Patient: Chelsea Montoya    Procedure(s):  INCISION & DRAINAGE, WOUND    Anesthesia type: general    Last Vitals:   Vitals:    01/01/18 1915   BP: 148/70   Pulse: 93   Resp: 16   Temp: 36.7 C (98 F)   SpO2: 100%       Anesthesia Post Evaluation:     Patient Evaluated: PACU    Level of Consciousness: awake and alert    Pain Management: adequate    Airway Patency: patent    Anesthetic complications: No      PONV Status: none    Cardiovascular status: acceptable  Respiratory status: acceptable  Hydration status: acceptable        Anesthesia Qualified Clinical Data Registry 2018    PACU Reintubation  Did the Patient have general anesthesia with intubation: Yes  Did the Patient require reintubation in the PACU?: No  Was this a planned exubation trial (documented in the medical record)?: No    PONV Adult  Is the patient aged 42 or older: Yes  Did the patient receive recieve a general anesthestic: Yes  Does the patient have 3 or more risk factors for PONV? Yes  Did the patient receive anti-emetics from at least two classes of medications? Yes      PONV Pediatric  Is the patient aged 50-17? No            PACU Transfer Checklist Protocol  Was the patient transferred to the PACU at the conclusion of surgery? Yes  Was a checklist or transfer protocol used? Yes    ICU Transfer Checklist Protocol  Was the patient transferred to the ICU at the conclusion of surgery? No      Post-op Pain Assessment Prior to Anesthesia Care End  Age >=18 and assessed for pain in PACU: Yes  Pacu pain score <7/10: Yes      Perioperative Mortality  Perioperative mortality prior to Anesthesia end time: No    Perioperative Cardiac Arrest  Did the patient have an unanticipated intraoperative cardiac arrest between anesthesia start time and anesthesia end time? No    Unplanned Admission to ICU  Did the patient have an unplanned admission to the ICU (not initially anticipated at anesthesia start time)? No      Signed by:  Cyril Loosen, 01/01/2018 7:26 PM

## 2018-01-01 NOTE — ED Notes (Signed)
Pt in MRI at this time. Pain medication administered. Patient tolerated medication well. Respirations even and unlabored. Patient is AAOx4, NAD noted.

## 2018-01-01 NOTE — ED Notes (Signed)
Patient given 1mg  of Dilaudid at this time from 1 syringe in MRI. Patient tolerated medication well with NAD noted.

## 2018-01-01 NOTE — Brief Op Note (Signed)
I and D cervical wound  No complications  Drain placed.

## 2018-01-01 NOTE — ED Provider Notes (Signed)
Triage note: Direct bedding on, patient taken straight to ER room. Orders placed based on chief complaint in order to expedite patient care. I am not the primary provider for this patient.          Mignon Pine, MD  01/01/18 1208

## 2018-01-02 ENCOUNTER — Encounter: Payer: Self-pay | Admitting: Orthopaedic Surgery

## 2018-01-02 DIAGNOSIS — R32 Unspecified urinary incontinence: Secondary | ICD-10-CM

## 2018-01-02 DIAGNOSIS — R509 Fever, unspecified: Secondary | ICD-10-CM

## 2018-01-02 DIAGNOSIS — T8149XA Infection following a procedure, other surgical site, initial encounter: Secondary | ICD-10-CM

## 2018-01-02 DIAGNOSIS — R339 Retention of urine, unspecified: Secondary | ICD-10-CM

## 2018-01-02 DIAGNOSIS — D72829 Elevated white blood cell count, unspecified: Secondary | ICD-10-CM

## 2018-01-02 LAB — COMPREHENSIVE METABOLIC PANEL
ALT: 11 U/L (ref 0–55)
AST (SGOT): 12 U/L (ref 5–34)
Albumin/Globulin Ratio: 0.8 — ABNORMAL LOW (ref 0.9–2.2)
Albumin: 2.7 g/dL — ABNORMAL LOW (ref 3.5–5.0)
Alkaline Phosphatase: 56 U/L (ref 37–106)
BUN: 8 mg/dL (ref 7.0–19.0)
Bilirubin, Total: 0.5 mg/dL (ref 0.2–1.2)
CO2: 27 mEq/L (ref 22–29)
Calcium: 8.6 mg/dL (ref 8.5–10.5)
Chloride: 105 mEq/L (ref 100–111)
Creatinine: 0.6 mg/dL (ref 0.6–1.0)
Globulin: 3.3 g/dL (ref 2.0–3.6)
Glucose: 122 mg/dL — ABNORMAL HIGH (ref 70–100)
Potassium: 4.4 mEq/L (ref 3.5–5.1)
Protein, Total: 6 g/dL (ref 6.0–8.3)
Sodium: 136 mEq/L (ref 136–145)

## 2018-01-02 LAB — CBC
Absolute NRBC: 0 10*3/uL (ref 0.00–0.00)
Hematocrit: 37.7 % (ref 34.7–43.7)
Hgb: 12.2 g/dL (ref 11.4–14.8)
MCH: 29.1 pg (ref 25.1–33.5)
MCHC: 32.4 g/dL (ref 31.5–35.8)
MCV: 90 fL (ref 78.0–96.0)
MPV: 8.5 fL — ABNORMAL LOW (ref 8.9–12.5)
Nucleated RBC: 0 /100 WBC (ref 0.0–0.0)
Platelets: 300 10*3/uL (ref 142–346)
RBC: 4.19 10*6/uL (ref 3.90–5.10)
RDW: 13 % (ref 11–15)
WBC: 10.65 10*3/uL — ABNORMAL HIGH (ref 3.10–9.50)

## 2018-01-02 LAB — URINALYSIS REFLEX TO MICROSCOPIC EXAM - REFLEX TO CULTURE
Bilirubin, UA: NEGATIVE
Glucose, UA: NEGATIVE
Ketones UA: NEGATIVE
Leukocyte Esterase, UA: NEGATIVE
Nitrite, UA: NEGATIVE
Protein, UR: NEGATIVE
Specific Gravity UA: 1.015 (ref 1.001–1.035)
Urine pH: 5 (ref 5.0–8.0)
Urobilinogen, UA: NORMAL mg/dL

## 2018-01-02 LAB — GFR: EGFR: >60

## 2018-01-02 MED ORDER — DIAZEPAM 5 MG PO TABS
5.00 mg | ORAL_TABLET | Freq: Three times a day (TID) | ORAL | Status: DC | PRN
Start: 2018-01-02 — End: 2018-01-05
  Administered 2018-01-02 – 2018-01-05 (×5): 5 mg via ORAL
  Filled 2018-01-02 (×5): qty 1

## 2018-01-02 NOTE — Plan of Care (Signed)
Problem: Moderate/High Fall Risk Score >5  Goal: Patient will remain free of falls  Outcome: Progressing   01/01/18 2224   OTHER   High (Greater than 13) HIGH-Activate bed/chair exit alarm where available;HIGH-Apply yellow "Fall Risk" arm band;HIGH-Initiate use of floor mats as appropriate;HIGH-Consider use of low bed;HIGH-(VH Only) Yellow slippers;HIGH-(VH Only) Place "Reset Bed Alarm" sign above bed if in use;HIGH-(VH Only) Keep door open for better visability;HIGH-(VH Only) Plan with CNA for priority hourly rounding         Problem: Safety  Goal: Patient will be free from injury during hospitalization  Outcome: Progressing   01/02/18 0035   Goal/Interventions addressed this shift   Patient will be free from injury during hospitalization  Assess patient's risk for falls and implement fall prevention plan of care per policy;Provide and maintain safe environment;Use appropriate transfer methods;Ensure appropriate safety devices are available at the bedside;Include patient/ family/ care giver in decisions related to safety;Hourly rounding;Assess for patients risk for elopement and implement Elopement Risk Plan per policy;Provide alternative method of communication if needed (communication boards, writing)       Goal: Patient will be free from infection during hospitalization  Outcome: Progressing   01/02/18 0035   Goal/Interventions addressed this shift   Free from Infection during hospitalization Assess and monitor for signs and symptoms of infection;Monitor lab/diagnostic results;Monitor all insertion sites (i.e. indwelling lines, tubes, urinary catheters, and drains);Encourage patient and family to use good hand hygiene technique         Problem: Pain  Goal: Pain at adequate level as identified by patient  Outcome: Progressing   01/02/18 0035   Goal/Interventions addressed this shift   Pain at adequate level as identified by patient Identify patient comfort function goal;Assess pain on admission, during daily  assessment and/or before any "as needed" intervention(s);Reassess pain within 30-60 minutes of any procedure/intervention, per Pain Assessment, Intervention, Reassessment (AIR) Cycle;Evaluate if patient comfort function goal is met;Offer non-pharmacological pain management interventions;Evaluate patient's satisfaction with pain management progress;Include patient/patient care companion in decisions related to pain management as needed         Problem: Side Effects from Pain Analgesia  Goal: Patient will experience minimal side effects of analgesic therapy  Outcome: Progressing   01/02/18 0035   Goal/Interventions addressed this shift   Patient will experience minimal side effects of analgesic therapy Assess for changes in cognitive function;Prevent/manage side effects per LIP orders (i.e. nausea, vomiting, pruritus, constipation, urinary retention, etc.);Evaluate for opioid-induced sedation with appropriate assessment tool (i.e. POSS);Monitor/assess patient's respiratory status (RR depth, effort, breath sounds)         Problem: Anterior/Posterior Cervical Discectomy/Fusion  Goal: Free from infection  Outcome: Progressing   01/02/18 0035   Goal/Interventions addressed this shift   Free from infection  Monitor/assess vital signs;Assess for signs and symptoms of infection;Assess surgical dressing, reinforce or change as needed per order;Teach/reinforce use of incentive spirometer 10 times per hour while awake, cough and deep breath as needed;Administer and discontinue antibiotics per SCIP measures;Foley maintenance care if Foley in place. Utilize indwelling urinary catheter policy/protocol ([place]).       Goal: Hemodynamic stability  Outcome: Progressing   01/02/18 0035   Goal/Interventions addressed this shift   Hemodynamic stability Monitor/assess vital signs;Monitor SpO2 and treat as needed;Monitor intake and output. Notify LIP if urine output is less than 240 mL in 8 hours.;Monitor/assess output from surgical drain  if present;Monitor/assess lab values. Notify LIP of abnormal results.       Goal: Mobility/activity is maintained  at optimum level for patient  Outcome: Progressing   01/02/18 0035   Goal/Interventions addressed this shift   Mobility/activity is maintained at optimum level Log roll while in bed;Consult/collaborate with Physical Therapy and Occupational Therapy;Out of bed standing with assistance, prior to midnight;Modified independent/independent ambulation greater than or equal to 50 feet;Keep HOB 30 degrees, unless contraindicated       Goal: Pain at adequate level as identified by patient  Outcome: Progressing   01/02/18 0035   Goal/Interventions addressed this shift   Pain at adequate level as identified by patient Identify and evaluate patient comfort function goal;Administer analgesics as ordered to achieve patient comfort function goal;Administer muscle relaxer as prescribed;Ice therapy as indicated;Reposition patient every 2 hours and as needed unless able to self-reposition       Goal: Stable neurovascular status  Outcome: Progressing   01/02/18 0035   Goal/Interventions addressed this shift   Stable neurovascular status Monitor/assess neurovascular status (pulses, capillary refill, pain, paresthesia, presence of edema);Teach/review/reinforce ankle pump exercises;VTE Prevention: administer anticoagulant(s) and/or apply anti-embolism stockings/devices as ordered       Goal: Effective airway  Outcome: Progressing   01/02/18 0035   Goal/Interventions addressed this shift   Effective airway  Assess respiratory rate, O2 saturation, and work of breathing;Monitor/assess output from surgical drain if present;Complete Anterior Neck Surgery assessment flowsheet;Maintain surgical airway kit or tracheostomy tray at bedside;Remove surgical dressing if necessary;Assess surgical site for hematoma development;Assess and monitor patient's ability to speak;Monitor/assess patient's ability to swallow and manage oral  secretions;Assess for vocal spasms (quality and character of voice)       Goal: Patient/patient care companion demonstrates understanding on disease process, treatment plan, medications and discharge plans  Outcome: Progressing   01/02/18 0035   Goal/Interventions addressed this shift   Patient/patient care companion demonstrates understanding of surgery/treatment plan, medications, and discharge plans Orient to unit;Provide education on patient medications, medication side effects, dressing changes, activity level, brace instructions and care to patient/patient care companion         Comments: Patient is alert and oriented to person, place, time, and situation  High falls risk  Bed is lowered and locked with 3 side rails up   Call bell and bedside table within reach  2 floor mats present, bed alarm on, hall light on  Scheduled medications given without difficulty  2L NC  VSS  0.9% NaCl infusing at 75 mL/hr  Foley in place, CHG and pericare performed prior to midnight  JP drain with serosanginuous output  WCTM

## 2018-01-02 NOTE — Plan of Care (Signed)
Problem: Moderate/High Fall Risk Score >5  Goal: Patient will remain free of falls  Outcome: Progressing   01/02/18 2028   OTHER   Moderate Risk (6-13) MOD-Initiate Yellow "Fall Risk" magnet communication tool;MOD-(VH Only) Yellow "Fall Risk" signage;MOD-(VH Only) Yellow slippers;MOD-([facility] Only) Apply yellow "Fall Risk" arm band;MOD-Apply bed exit alarm if patient is confused;MOD-Floor mat at bedside (where available) if appropriate;MOD-Request PT/OT consult order for patients with gait/mobility impairment;MOD-include family in multidisciplinary POC discussions;MOD-Place Fall Risk level on whiteboard in room         Problem: Safety  Goal: Patient will be free from injury during hospitalization  Outcome: Progressing   01/02/18 2144   Goal/Interventions addressed this shift   Patient will be free from injury during hospitalization  Assess patient's risk for falls and implement fall prevention plan of care per policy;Provide and maintain safe environment;Use appropriate transfer methods;Ensure appropriate safety devices are available at the bedside;Include patient/ family/ care giver in decisions related to safety;Hourly rounding;Assess for patients risk for elopement and implement Elopement Risk Plan per policy;Provide alternative method of communication if needed (communication boards, writing)       Goal: Patient will be free from infection during hospitalization  Outcome: Progressing   01/02/18 2144   Goal/Interventions addressed this shift   Free from Infection during hospitalization Assess and monitor for signs and symptoms of infection;Monitor lab/diagnostic results;Monitor all insertion sites (i.e. indwelling lines, tubes, urinary catheters, and drains);Encourage patient and family to use good hand hygiene technique         Problem: Pain  Goal: Pain at adequate level as identified by patient  Outcome: Progressing   01/02/18 2144   Goal/Interventions addressed this shift   Pain at adequate level as identified by  patient Identify patient comfort function goal;Assess pain on admission, during daily assessment and/or before any "as needed" intervention(s);Reassess pain within 30-60 minutes of any procedure/intervention, per Pain Assessment, Intervention, Reassessment (AIR) Cycle;Evaluate if patient comfort function goal is met;Evaluate patient's satisfaction with pain management progress;Offer non-pharmacological pain management interventions;Include patient/patient care companion in decisions related to pain management as needed;Assess for risk of opioid induced respiratory depression, including snoring/sleep apnea. Alert healthcare team of risk factors identified.;Consult/collaborate with Physical Therapy, Occupational Therapy, and/or Speech Therapy         Problem: Side Effects from Pain Analgesia  Goal: Patient will experience minimal side effects of analgesic therapy  Outcome: Completed Date Met: 01/02/18   01/02/18 2144   Goal/Interventions addressed this shift   Patient will experience minimal side effects of analgesic therapy Monitor/assess patient's respiratory status (RR depth, effort, breath sounds);Assess for changes in cognitive function;Prevent/manage side effects per LIP orders (i.e. nausea, vomiting, pruritus, constipation, urinary retention, etc.);Evaluate for opioid-induced sedation with appropriate assessment tool (i.e. POSS)         Problem: Anterior/Posterior Cervical Discectomy/Fusion  Goal: Free from infection  Outcome: Progressing   01/02/18 2144   Goal/Interventions addressed this shift   Free from infection  Monitor/assess vital signs;Assess for signs and symptoms of infection;Assess surgical dressing, reinforce or change as needed per order;Teach/reinforce use of incentive spirometer 10 times per hour while awake, cough and deep breath as needed;Administer and discontinue antibiotics per SCIP measures;Foley maintenance care if Foley in place. Utilize indwelling urinary catheter policy/protocol  ([place]).       Goal: Hemodynamic stability  Outcome: Progressing   01/02/18 2144   Goal/Interventions addressed this shift   Hemodynamic stability Monitor/assess vital signs;Monitor SpO2 and treat as needed;Monitor intake and output. Notify LIP if  urine output is less than 240 mL in 8 hours.;Monitor/assess output from surgical drain if present;Monitor/assess lab values. Notify LIP of abnormal results.       Goal: Mobility/activity is maintained at optimum level for patient  Outcome: Progressing   01/02/18 2144   Goal/Interventions addressed this shift   Mobility/activity is maintained at optimum level Log roll while in bed;Consult/collaborate with Physical Therapy and Occupational Therapy;Out of bed standing with assistance, prior to midnight;Modified independent/independent ambulation greater than or equal to 50 feet;Keep HOB 30 degrees, unless contraindicated       Goal: Pain at adequate level as identified by patient  Outcome: Progressing   01/02/18 2144   Goal/Interventions addressed this shift   Pain at adequate level as identified by patient Identify and evaluate patient comfort function goal;Administer analgesics as ordered to achieve patient comfort function goal;Reposition patient every 2 hours and as needed unless able to self-reposition       Goal: Stable neurovascular status  Outcome: Progressing   01/02/18 2144   Goal/Interventions addressed this shift   Stable neurovascular status Monitor/assess neurovascular status (pulses, capillary refill, pain, paresthesia, presence of edema);VTE Prevention: administer anticoagulant(s) and/or apply anti-embolism stockings/devices as ordered       Goal: Effective airway  Outcome: Progressing   01/02/18 2144   Goal/Interventions addressed this shift   Effective airway  Assess respiratory rate, O2 saturation, and work of breathing;Monitor/assess output from surgical drain if present;Complete Anterior Neck Surgery assessment flowsheet;Maintain surgical airway kit or  tracheostomy tray at bedside;Assess surgical site for hematoma development;Remove surgical dressing if necessary;Assess and monitor patient's ability to speak;Monitor/assess patient's ability to swallow and manage oral secretions;Assess for vocal spasms (quality and character of voice)       Goal: Patient/patient care companion demonstrates understanding on disease process, treatment plan, medications and discharge plans  Outcome: Progressing   01/02/18 2144   Goal/Interventions addressed this shift   Patient/patient care companion demonstrates understanding of surgery/treatment plan, medications, and discharge plans Orient to unit;Provide discharge instructions, medication reconciliation, and prescriptions to patient/patient care companion;Provide education on patient medications, medication side effects, dressing changes, activity level, brace instructions and care to patient/patient care companion         Comments: Patient is alert and oriented to person, place, time, and situation  Moderate falls risk  Bed is lowered and locked with 2 side rails up  Call bell and bedside table within reach  2 floor mats down, bed alarm on as needed  Scheduled medications given without difficulty  Surgical incision dressing reinforced  Pain controlled with prn percocet  Vitals stable  Foley draining clear yellow urine; re-placed today after failed voiding trial  Will continue to monitor and perform hourly rounding

## 2018-01-02 NOTE — Progress Notes (Signed)
ORTHOPEDICS SPINE  PROGRESS NOTE      Patient: Chelsea Montoya  Date: 01/02/2018   LOS: 1 Days  Admission Date: 01/01/2018   MRN: [id number]  Attending: Alonna Buckler, MD     ASSESSMENT     Chelsea Montoya is a 62 y.o. female admitted with anterior neck fluid collection 1 week following C3-C7 ACDF. She underwent I&D yesterday.  Stains and cultures negative at this time.  Right C5 strength is improving per patient.  Minimal drain output.    PLAN     Continue drain.  Continue antibiotics as per ID and adjust as per culture results.  Continue PT/OT.  Will likely require home IV abx.        SUBJECTIVE     Chelsea Montoya reports pain is improved.   She was able to walk in hall with walker. Reports good strength in legs.  She also feels right arm strength is improving.  She is able to take oral liquids and solids.  Reports voice hoarseness.  Denies bladder/bowel incontinence.      MEDICATIONS     Current Facility-Administered Medications   Medication Dose Route Frequency   . albuterol  2 puff Inhalation TID   . buPROPion XL  300 mg Oral QAM   . cefepime  2 g Intravenous Q8H   . cetirizine  10 mg Oral Daily   . dicyclomine  10 mg Oral BID   . fluticasone furoate-vilanterol  1 puff Inhalation QAM   . furosemide  10 mg Oral BID   . senna-docusate  1 tablet Oral BID   . vancomycin  1,500 mg Intravenous Q12H         PHYSICAL EXAM     Visit Vitals  BP 131/60   Pulse 65   Temp 98.5 F (36.9 C) (Oral)   Resp 13   Ht 1.549 m (5\' 1" )   Wt 95.7 kg (210 lb 15.7 oz)   SpO2 94%   BMI 39.86 kg/m       Temperature: Temp  Min: 97.3 F (36.3 C)  Max: 98.6 F (37 C)  Pulse: Pulse  Min: 65  Max: 89  Respiratory: Resp  Min: 13  Max: 19  Non-Invasive BP: BP  Min: 123/61  Max: 136/60  Pulse Oximetry SpO2  Min: 92 %  Max: 99 %    Intake and Output Summary (Last 24 hours) at Date Time    Intake/Output Summary (Last 24 hours) at 01/02/18 2249  Last data filed at 01/02/18 2100   Gross per 24 hour   Intake             1000 ml   Output              2813 ml   Net            -1813 ml     Drain outlput 17ml +54ml    NEURO:  Motor:  4/5 right and 5/5 left elbow  flexion   5/5 finger abduction, wrist extension, elbow flexion, elbow extension   5/5 bilateral toe extension, ankle plantar flexion, ankle dorsilfexion, knee extension, hip adduction  Sens:  Reduced intensity of LT right thumb and index finger    LABS       Recent Labs  Lab 01/02/18  1004 01/01/18  1254 12/28/17  0324   WBC 10.65* 17.01* 14.58*   RBC 4.19 4.98 4.52   Hgb 12.2 14.4 13.1   Hematocrit 37.7 44.0* 40.5  MCV 90.0 88.4 89.6   Platelets 300 342 318         Recent Labs  Lab 01/02/18  1004 01/01/18  1253 12/28/17  0324 12/27/17  0353   Sodium 136 135* 139 135*   Potassium 4.4 4.7 4.4 4.3   Chloride 105 100 104 102   CO2 27 26 27 25    BUN 8.0 11.0 7.0 6.0*   Creatinine 0.6 0.7 0.7 0.7   Glucose 122* 133* 146* 143*   Calcium 8.6 9.5 9.4 9.4         Recent Labs  Lab 01/02/18  1004 01/01/18  1253 12/28/17  0324   ALT 11 13 15    AST (SGOT) 12 18 23    Bilirubin, Total 0.5 0.4 0.1*   Albumin 2.7* 3.1* 3.2*   Alkaline Phosphatase 56 71 59                   Microbiology Results     Procedure Component Value Units Date/Time    Blood Culture Aerobic/Anaerobic #1 [[id number]] Collected:  01/01/18 1254    Specimen:  Arm from Blood, Intravenous Line Updated:  01/02/18 1521    Narrative:       ORDER#: [id number]                                    ORDERED BY: Baldwin Jamaica  SOURCE: Blood, Intravenous Line steripath r ac       COLLECTED:  01/01/18 12:54  ANTIBIOTICS AT COLL.:                                RECEIVED :  01/01/18 14:56  Culture Blood Aerobic and Anaerobic        PRELIM      01/02/18 15:21  01/02/18   No Growth after 1 day/s of incubation.      Blood Culture Aerobic/Anaerobic #2 [[id number]] Collected:  01/01/18 1303    Specimen:  Arm from Blood, Venipuncture Updated:  01/02/18 1521    Narrative:       ORDER#: [id number]                                    ORDERED BY: Baldwin Jamaica  SOURCE: Blood,  Venipuncture steriapth                COLLECTED:  01/01/18 13:03  ANTIBIOTICS AT COLL.:                                RECEIVED :  01/01/18 14:56  Culture Blood Aerobic and Anaerobic        PRELIM      01/02/18 15:21  01/02/18   No Growth after 1 day/s of incubation.      Culture + Gram Stain,Aerobic, Wound [[id number]] Collected:  01/01/18 1820    Specimen:  Wound from Wound Updated:  01/02/18 0549    Narrative:       ORDER#: [id number]                                    ORDERED BY: Rachael Fee  SOURCE: Wound Deep Neck Wound #1  COLLECTED:  01/01/18 18:20  ANTIBIOTICS AT COLL.:                                RECEIVED :  01/02/18 02:56  Stain, Gram                                FINAL       01/02/18 05:49  01/02/18   Rare WBCs             No organisms seen             No Squamous epithelial cells seen  Culture and Gram Stain, Aerobic, Wound     PENDING      Culture + Gram Stain,Aerobic, Wound [[id number]] Collected:  01/01/18 1820    Specimen:  Wound from Wound Updated:  01/02/18 0546    Narrative:       ORDER#: [id number]                                    ORDERED BY: Rachael Fee  SOURCE: Wound Deep Neck Wound #2                     COLLECTED:  01/01/18 18:20  ANTIBIOTICS AT COLL.:                                RECEIVED :  01/02/18 03:07  Stain, Gram                                FINAL       01/02/18 05:46  01/02/18   No WBCs or organisms seen             No Squamous epithelial cells seen  Culture and Gram Stain, Aerobic, Wound     PENDING      Culture, Anaerobic Bacteria [[id number]] Collected:  01/01/18 1820    Specimen:  Other from Wound Updated:  01/02/18 0307    Culture, Anaerobic Bacteria [[id number]] Collected:  01/01/18 1820    Specimen:  Other from Wound Updated:  01/02/18 0256    Fungal Culture & Smear [[id number]] Collected:  01/01/18 1820    Specimen:  Other from Tissue Updated:  01/02/18 1526    Narrative:       ORDER#: [id number]                                    ORDERED BY:  Rachael Fee  SOURCE: Tissue Deep Neck Wound #2                    COLLECTED:  01/01/18 18:20  ANTIBIOTICS AT COLL.:                                RECEIVED :  01/02/18 03:07  Stain, Fungal                              FINAL  01/02/18 15:25  01/02/18   No Fungal or Yeast Elements Seen  Culture Fungus                             PENDING      Fungal Culture & Smear [[id number]] Collected:  01/01/18 1820    Specimen:  Other from Tissue Updated:  01/02/18 1526    Narrative:       ORDER#: [id number]                                    ORDERED BY: Rachael Fee  SOURCE: Tissue Deep Neck Wound #1                    COLLECTED:  01/01/18 18:20  ANTIBIOTICS AT COLL.:                                RECEIVED :  01/02/18 02:56  Stain, Fungal                              FINAL       01/02/18 15:25  01/02/18   No Fungal or Yeast Elements Seen  Culture Fungus                             PENDING             RADIOLOGY     Radiological Procedure reviewed.    MRI Cervical Spine W WO Contrast   Final Result       1. There is a new large fluid collection involving the prevertebral   cervical soft tissues which is suspicious for potential abscess in this   setting. There is no appreciable involvement of the spinal canal.   2. Multilevel degenerative thoracic spondylosis.      Chelsea Dress, MD    01/01/2018 4:41 PM      MRI Thoracic Spine W WO Contrast   Final Result       1. There is a new large fluid collection involving the prevertebral   cervical soft tissues which is suspicious for potential abscess in this   setting. There is no appreciable involvement of the spinal canal.   2. Multilevel degenerative thoracic spondylosis.      Chelsea Dress, MD    01/01/2018 4:41 PM      CT Cervical Spine without Contrast   Final Result         1. Again identified are postsurgical changes status post ACDF C3-C7.   There is no evidence of hardware failure.    2. There is evidence of progressive edema/fluid involving the operative   bed involving  the cervical soft tissues on the left, with progressive   prevertebral soft tissue swelling diffusely. Neck CT, with contrast, may   be helpful for further characterization as clinically warranted.      Findings were discussed with Dr. Laurie Panda at approximately 1:50   PM on January 01, 2018.      Chelsea Dress, MD    01/01/2018 1:55 PM              ATTESTATION     Signed,  Kista Robb K. Andrika Peraza MD MPH  Orthopedics Spine Attending  Pager # [phone]  Cell # [phone]    [facility]  [address]  [place], [place] [address]    Tel: [phone]  Fax: [phone]  [email or web address]  \

## 2018-01-02 NOTE — PT Progress Note (Signed)
[facility]   Physical Therapy Cancellation Note      Patient:  Chelsea Montoya MRN#:  [id number]  Unit:  [facility] CCW GROUND UNIT Room/Bed:  FG30/FG30.01    01/02/2018  Time: 1:49 PM        Pt not seen for physical therapy secondary to per Occupational Therapy evaluation, pt is mobilizing independently.  No skilled PT needs identified at this time. D/C PT. Please reconsult if new concerns arise.     Thank you,  Gifford Shave, PT [phone]

## 2018-01-02 NOTE — Progress Notes (Addendum)
Pt unable to void more than 300 ml with more than 500 ml left in bladder. Foley placed per protocol.

## 2018-01-02 NOTE — UM Notes (Signed)
Admit to Inpatient (Order [id number])   Date: 01/01/2018      [facility] CCW Ground Unit     ADMISSION & DAY 1 post op    Temp:  [97.2 F -100.8 F] 98 F   Heart Rate:  [81-95] 89  Resp Rate:  [12-22] 17  BP: (121-153)/(56-74) 126/60    7/9  Date of Operation:   01/01/2018  Operative Procedure:   INCISION & DRAINAGE, WOUND  Preoperative Diagnosis:   S/P cervical discectomy,   Indications:   61-yo p/w wound swelling and erythema, elevated WBC.   Complications:   none    ID NOTE  -- recently anterior/posterir cervical fusion/discectomy   Coming in for concern for surgical site wound infection  -- Imaging showing fluid collection at surgical site  -- Surgery planned for today  -- Temp 100.8  -- Noted GI upset w/ Keflex - not true allergy   Antimicrobials:   Start Cefepime 2g IV q8  Start Vancomycin 1500mg  IV q12  Recommendations:   Start Cefepime and Vancomycin - to be given pre-operatively and continue after surgery  Intra-op wound cx to be sent  Follow blood cx  Check Vanc trough prior to 4th dose  Discussed with Neurosurgery team     7/10  Assessment:   Post op day # 1 s/p I and D of cervical wound  Plan:   1.  S/p I and D  -improved neuro function noted on exam.  Pain controlled on present medications.  Have discussed w/ the pt the need to discontinue IV dilaudid.    Pt to ambulate. d/c foley. Collar to be worn btwn 5-7 pm only. JP drain w/ expected output.  Is medically necessary. con't to monitor. C5 palsy.  PT/OT daily.  -ID to manage abx; appreciate the consult  -will check CBC and CMP  Subjective:   Pt is post op day # 1 s/p I and D of cervical wound.  States pain is controlled.  Pt very drowsy.  States right arm pain is improved.    Has been doing right arm exercises as directed.  Has not been up walking yet.  Denies HA, dizziness, CP, sob, N/V.    Scheduled Meds:  albuterol  2 puff Inhalation TID   buPROPion XL  300 mg Oral QAM   cefepime  2 g IV Q8H   cetirizine  10 mg Oral Daily   dicyclomine   10 mg Oral BID   fluticasone furoate-vilanterol  1 puff Inhalation QAM   furosemide  10 mg Oral BID   senna-docusate  1 tablet Oral BID   vancomycin  1,500 mg IV Q12H   Continuous Infusions:  . sodium chloride 75 mL/hr at 01/01/18 2340   PRN Meds:.benzocaine-menthol, bisacodyl, diazePAM, gadobutrol, HYDROmorphone, magnesium hydroxide,  oxyCODONE-acetaminophen, pantoprazole, sodium phosphate       01/02/18   1004  01/01/18   1254  01/01/18   1253   Sodium  136   --   135*   Albumin  2.7*   --   3.1*   Glucose  122*   --   133*   WBC  10.65*  17.01*   --    Neutrophils   --   77.5   --    CRP   --    --   7.6*   Hematocrit  37.7  44.0*   --

## 2018-01-02 NOTE — OT Eval Note (Signed)
[facility]   Occupational Therapy Evaluation     Patient: Chelsea Montoya    MRN#: [id number]   Unit: [facility] CCW GROUND UNIT  Bed: FG30/FG30.01                                     Discharge Recommendations:   Discharge Recommendation: Home with supervision, Home with outpatient OT   DME Recommended for Discharge:  (in place)      Assessment:   Chelsea Montoya is a 63 y.o. female admitted 01/01/2018.   Expanded chart review completed including review of labs, review of imaging, review of vitals, review of H&P and physician progress notes, review of OR report and review of consulting physician notes .  Pt's ability to complete ADLs and functional transfers is impaired due to the following deficits:  sensation deficits and decreased strength.  Pt demonstrates performance deficits with feeding, grooming, bathing, dressing and toileting. There are a few comorbidities or other factors that affect plan of care and require modification of task including: recent surgery, residual neurological symptoms, has stairs to manage and home alone for a portion of the day. Pt pleasant and cooperative, performing self-feeding ADL task with MinA for ADL container management and set-up. Pt performed bed mobility, scooting and LB ADLs in taylor sit position. Pt with LUE and BLE WFL for all ROM, MMT and coordination. Pt with C5 palsy in RUE shoulder, demonstrating weakness in deltoids and bicep, limited AROM at shoulder level. Pt performed sit <> stand transfer and functional mobility over household distance and completed dynamic standing balance activities with (CS) and no LOB. Pt educated on POC, therapeutic process and all questions answered. Pt below baseline but likely to progress quickly with rehab services while in house and at time of D/C to promote a return to highest level of functional performance.      Therapy Diagnosis: Decreased balance, decreased strength  Rehabilitation Potential:  Good for stated  goals with continued Occupational Therapy treatment intervention    Treatment Activities: OT evaluation, thera-act patient/family received education and demonstrates understanding and carryover.   Educated the patient to role of occupational therapy, plan of care, goals of therapy and safety with mobility and ADLs.    Plan:   OT Frequency Recommended: 2-3x/wk     Treatment/Interventions: ADL training, functional transfer training, modified bed mobility training, education on energy conservation/fall prevention techniques, compensatory technique education, UE strengthening, endurance training, Relaxation/pain management techniques, education on medical equipment beneficial for increasing ADL performance and safety in home environment.      Risks/benefits/POC discussed: Yes       Precautions and Contraindications:    Fall risk,     Consult received for Chelsea Montoya for OT Evaluation and Treatment.  Patient's medical condition is appropriate for Occupational Therapy intervention at this time.      History of Present Illness:    Chelsea Montoya is a 62 y.o. female admitted on 01/01/2018 now Post op day # 1 s/p I and D of cervical wound    Admitting Diagnosis: Fever postop [R50.82]  S/P cervical discectomy [Z98.890]  Surgical site infection [T81.49XA]    Past Medical/Surgical History:  Past Medical History:   Diagnosis Date   . Anemia     resolved   . Anxiety    . Arrhythmia     followed by cardio    . Arthritis  osteoarthritis   . Asthma without status asthmaticus     allergy-induced only- LD 09/2017   . Blood transfusion without reported diagnosis     14 years ago   . Complication of anesthesia     wake up fast   . Crohn's colitis 01/31/2012   . Crohn's disease     remission - colonoscopy    . Duplicated right renal collecting system    . Heart murmur     followed by cardio    . Hypertension     Runs 120/78's   . Insomnia    . Kidney stone    . Mitral valve prolapse    . Pneumonia    . Pyelonephritis    . Seizures      Just once in 1978- stress induced    . Shortness of breath     R/T allergy   . Thyroid nodule     BILATERAL 09/2014; RT LOBE THYROID NODULE BX - BENIGN   . Vision abnormalities      Past Surgical History:   Procedure Laterality Date   . APPENDECTOMY  2000's   . BUNIONECTOMY Right 1998   . CLOSED REDUCTION, PERCUTANEOUS FIXATION, FINGER  10/28/2012    Procedure: CLOSED REDUCTION, PERCUTANEOUS FIXATION, FINGER;  Surgeon: Arta Bruce, MD;  Location: [facility];  Service: Orthopedics;  Laterality: Left;  Percutaneous Fixation of Left Ring Finger.     . COLECTOMY  1988    colon resection   . COLONOSCOPY W/ BIOPSIES  02/2016 2019    Dr Cinda Quest - 1 POLYP, INT HEMORRHOIDS, CROHN'S IN REMISSION - RTN 2021    . ELBOW SURGERY Right 2010   . ENDOMETRIAL BIOPSY  04/22/2014    RARE SUPERFICIAL ENDOMETRIAL GLANDS AMIDST MUCUS.TISSUE IS INSUFFICIENT FOR THE OPTIMAL EVALUATION OF THE  ENDOMETRIAL CAVITY.   Lavenia Atlas, ANTERIOR CERVICAL, LEVELS 3+ N/A 12/26/2017    Procedure: FUSION, ANTERIOR CERVICAL, LEVELS 3+;  Surgeon: Kizzie Ide, MD;  Location: [facility];  Service: Orthopedics;  Laterality: N/A;  C3-C7 ANTERIOR CERVICAL DISCECTOMY & FUSION   . GYNECOLOGIC CRYOSURGERY  1970's   . INCISION & DRAINAGE, WOUND N/A 01/01/2018    Procedure: INCISION & DRAINAGE, WOUND;  Surgeon: Kizzie Ide, MD;  Location: [facility];  Service: Orthopedics;  Laterality: N/A;   . KNEE ARTHROPLASTY Right 2001, 2016   . KNEE ARTHROPLASTY Left 2009   . THYROID NODULE BIOPSY  09/2014    RT LOBE THYROID NODULE       Imaging/Tests/Labs:  Ct Cervical Spine Without Contrast    Result Date: 01/01/2018  1. Again identified are postsurgical changes status post ACDF C3-C7. There is no evidence of hardware failure. 2. There is evidence of progressive edema/fluid involving the operative bed involving the cervical soft tissues on the left, with progressive prevertebral soft tissue swelling diffusely. Neck CT, with contrast, may be helpful for  further characterization as clinically warranted. Findings were discussed with Dr. Laurie Panda at approximately 1:50 PM on January 01, 2018. Nicoletta Dress, MD 01/01/2018 1:55 PM    Ct Cervical Spine Wo Contrast    Result Date: 12/26/2017  Postsurgical changes status post ACDF C3-C7. The hardware is intact and appears appropriately positioned. Nicoletta Dress, MD 12/26/2017 5:30 PM    Mri Cervical Spine Wo Contrast    Result Date: 12/27/2017   1.  Postoperative changes related to recent or cervical spinal fusion and discectomy from C3 to C7. There is no  evidence of intraspinal or extraspinal hematoma. There is no evidence of cervical cord or ligamentous injury. 2.  Multilevel cervical spondylosis as described in detail above. These findings were discussed with Dr. Kizzie Bane at 4:20 PM on 12/27/2017. Neldon Mc, MD 12/27/2017 4:27 PM    Mri Cervical Spine W Wo Contrast    Result Date: 01/01/2018   1. There is a new large fluid collection involving the prevertebral cervical soft tissues which is suspicious for potential abscess in this setting. There is no appreciable involvement of the spinal canal. 2. Multilevel degenerative thoracic spondylosis. Nicoletta Dress, MD 01/01/2018 4:41 PM    Mri Thoracic Spine W Wo Contrast    Result Date: 01/01/2018   1. There is a new large fluid collection involving the prevertebral cervical soft tissues which is suspicious for potential abscess in this setting. There is no appreciable involvement of the spinal canal. 2. Multilevel degenerative thoracic spondylosis. Nicoletta Dress, MD 01/01/2018 4:41 PM    Fluoroscopy Less Than 1 Hour    Result Date: 12/26/2017   C-spine fusion under fluoroscopic guidance. Genelle Bal, MD 12/26/2017 12:10 PM      Social History:   Prior Level of Function: Independent  DME Currently at Home: RW, suction cup grab bar  Home Living Arrangements: Home with husband and adult son  Type of Home: Multi-level  Home Layout: 3 STE with 1st floor set-up    Subjective:     Patient is  agreeable to participation in the therapy session. Nursing clears patient for therapy.     Patient Goal:  "Go Home"  Pain:   Scale: 6/10  Location: throat  Intervention: RN made aware, positioning and rest     Objective:   Patient is in bed with SCD's and peripheral IV, O2 at 3 liters/minute via nasal cannula, JP Drain in place.      Cognitive Status and Neuro Exam:   Alert and oriented x4    Musculoskeletal Examination  RUE ROM: C5 palsy  LUE ROM: grossly WFL  RLE ROM: WFL grossly   LLE ROM: WFL grossly     RUE Strength: 2 /5 grossly   LUE Strength: 4 /5 grossly   RLE Strength: 4/5 grossly   LLE Strength: 4/5 grossly     Sensory/Oculomotor Examination  Auditory:  appears WFL, pt able to hear writer without noted difficulty   Tactile:  grossly intact  Vision:  appears WFL; no deficits noted in acuity or tracking.    Coordination: WFL       Activities of Daily Living  Eating: MinA - hand to mouth WFL   Grooming: MinA   Bathing: NT  UE Dressing: MinA  LE Dressing: ModI  Toileting: NT    Functional Mobility:  Supine to Sit: (I)  Sit to Stand: (I)  Transfers: (I)     Balance  Static Sitting: (I)  Dynamic Sitting: (I)  Static Standing: (S)  Dynamic Standing: (CS)    Participation and Activity Tolerance  Participation Effort: good  Endurance:  Fair +    Patient left with call bell within reach, all needs met, SCDs off, fall mat down, bed alarm on and all questions answered. RN notified of session outcome and patient response.       Goals:  Time For Goal Achievement: 3 visits  ADL Goals  Patient will groom self: Stand by Assist, at sinkside  Patient will dress upper body: Stand by Assist, with hemi tech  Mobility and Transfer Goals  Pt will transfer bed to toilet: Stand by Assist  Neuro Re-Ed Goals  Pt will perform dynamic standing balance: Stand by Assist, for 15 minutes, to complete standing ADLs safely                        Salli Quarry OTR/L, CSRS  Pager [phone]      Time of treatment:   OT Received On: 01/02/18  Start  Time: 0930  Stop Time: 1005  Time Calculation (min): 35 min

## 2018-01-02 NOTE — Progress Notes (Signed)
PROGRESS NOTE    Date Time: 01/02/18 9:01 AM  Patient Name: Chelsea Montoya, Chelsea Montoya      Assessment:   Post op day # 1 s/p I and Montoya of cervical wound    Plan:   1.  S/p I and Montoya  -improved neuro function noted on exam.  Pain controlled on present medications.  Have discussed w/ the pt the need to discontinue IV dilaudid.  Pt to ambulate.  Will Montoya/c foley.  Collar to be worn between 5-7 pm only.  JP drain w/ expected output.  Is medically necessary.  Will con't to monitor.  Patient w/ C5 palsy.  PT/OT daily.  -ID to manage abx; appreciate the consult  -will check CBC and CMP      Subjective:   Pt is post op day # 1 s/p I and Montoya of cervical wound.  States pain is controlled.  Pt very drowsy.  States right arm pain is improved.  Has been doing right arm exercises as directed.  Has not been up walking yet.  Denies HA, dizziness, CP, sob, N/V.    Medications:     Current Facility-Administered Medications   Medication Dose Route Frequency   . albuterol  2 puff Inhalation TID   . buPROPion XL  300 mg Oral QAM   . cefepime  2 g Intravenous Q8H   . cetirizine  10 mg Oral Daily   . dicyclomine  10 mg Oral BID   . fluticasone furoate-vilanterol  1 puff Inhalation QAM   . furosemide  10 mg Oral BID   . senna-docusate  1 tablet Oral BID   . vancomycin  1,500 mg Intravenous Q12H       Review of Systems:   A comprehensive review of systems was: Pos for neck pain and right arm weakness    Physical Exam:     Vitals:    01/02/18 0737   BP: 127/60   Pulse: 83   Resp: 17   Temp: 98.6 F (37 C)   SpO2: 99%       Intake and Output Summary (Last 24 hours) at Date Time    Intake/Output Summary (Last 24 hours) at 01/02/18 0901  Last data filed at 01/01/18 2341   Gross per 24 hour   Intake             1650 ml   Output              392 ml   Net             1258 ml       WD/WN: NAD;  A&O x 4  Dressing:  C/Montoya/I  JP drain:  Functioning  Neuro:  Right C5 palsy noted.  Pt able to lift left arm above head.  C6-8 5/5 bilaterally.  Sensation intact    Labs:      Results     Procedure Component Value Units Date/Time    Culture + Gram Stain,Aerobic, Wound [[id number]] Collected:  01/01/18 1820    Specimen:  Wound from Wound Updated:  01/02/18 0549    Narrative:       ORDER#: [id number]                                    ORDERED BY: Rachael Fee  SOURCE: Wound Deep Neck Wound #1  COLLECTED:  01/01/18 18:20  ANTIBIOTICS AT COLL.:                                RECEIVED :  01/02/18 02:56  Stain, Gram                                FINAL       01/02/18 05:49  01/02/18   Rare WBCs             No organisms seen             No Squamous epithelial cells seen  Culture and Gram Stain, Aerobic, Wound     PENDING      Culture + Gram Stain,Aerobic, Wound [[id number]] Collected:  01/01/18 1820    Specimen:  Wound from Wound Updated:  01/02/18 0546    Narrative:       ORDER#: [id number]                                    ORDERED BY: Kizzie Bane, STEVEN  SOURCE: Wound Deep Neck Wound #2                     COLLECTED:  01/01/18 18:20  ANTIBIOTICS AT COLL.:                                RECEIVED :  01/02/18 03:07  Stain, Gram                                FINAL       01/02/18 05:46  01/02/18   No WBCs or organisms seen             No Squamous epithelial cells seen  Culture and Gram Stain, Aerobic, Wound     PENDING      Fungal Culture & Smear [[id number]] Collected:  01/01/18 1820    Specimen:  Other from Tissue Updated:  01/02/18 0307    Culture, Anaerobic Bacteria [[id number]] Collected:  01/01/18 1820    Specimen:  Other from Wound Updated:  01/02/18 0307    Fungal Culture & Smear [[id number]] Collected:  01/01/18 1820    Specimen:  Other from Tissue Updated:  01/02/18 0256    Culture, Anaerobic Bacteria [[id number]] Collected:  01/01/18 1820    Specimen:  Other from Wound Updated:  01/02/18 0256    Blood Culture Aerobic/Anaerobic #1 [[id number]] Collected:  01/01/18 1254    Specimen:  Arm from Blood, Intravenous Line Updated:  01/01/18 1456    Narrative:       1 BLUE+1 PURPLE     Blood Culture Aerobic/Anaerobic #2 [[id number]] Collected:  01/01/18 1303    Specimen:  Arm from Blood, Venipuncture Updated:  01/01/18 1456    Narrative:       1 BLUE+1 PURPLE    Sedimentation rate (ESR) [[id number]]  (Abnormal) Collected:  01/01/18 1254    Specimen:  Blood Updated:  01/01/18 1419     Sed Rate 30 (H) mm/Hr     Type and Screen [[id number]] Collected:  01/01/18 1303    Specimen:  Blood Updated:  01/01/18 1413     ABO  Rh A POS     AB Screen Gel NEG    Comprehensive metabolic panel [[id number]]  (Abnormal) Collected:  01/01/18 1253    Specimen:  Blood Updated:  01/01/18 1353     Glucose 133 (H) mg/dL      BUN 47.8 mg/dL      Creatinine 0.7 mg/dL      Sodium 295 (L) mEq/L      Potassium 4.7 mEq/L      Chloride 100 mEq/L      CO2 26 mEq/L      Calcium 9.5 mg/dL      Protein, Total 7.1 g/dL      Albumin 3.1 (L) g/dL      AST (SGOT) 18 U/L      ALT 13 U/L      Alkaline Phosphatase 71 U/L      Bilirubin, Total 0.4 mg/dL      Globulin 4.0 (H) g/dL      Albumin/Globulin Ratio 0.8 (L)    C Reactive Protein [[id number]]  (Abnormal) Collected:  01/01/18 1253    Specimen:  Blood Updated:  01/01/18 1353     C-Reactive Protein 7.6 (H) mg/dL     GFR [[id number]] Collected:  01/01/18 1253     Updated:  01/01/18 1353     EGFR >60.0    CBC with differential [[id number]]  (Abnormal) Collected:  01/01/18 1254    Specimen:  Blood from Blood Updated:  01/01/18 1333     WBC 17.01 (H) x10 3/uL      Hgb 14.4 g/dL      Hematocrit 02.7 (H) %      Platelets 342 x10 3/uL      RBC 4.98 x10 6/uL      MCV 88.4 fL      MCH 28.9 pg      MCHC 32.7 g/dL      RDW 13 %      MPV 8.8 (L) fL      Neutrophils 77.5 %      Lymphocytes Automated 15.3 %      Monocytes 5.3 %      Eosinophils Automated 1.0 %      Basophils Automated 0.4 %      Immature Granulocyte 0.5 %      Nucleated RBC 0.0 /100 WBC      Neutrophils Absolute 13.17 (H) x10 3/uL      Abs Lymph Automated 2.61 x10 3/uL      Abs Mono Automated 0.91 (H) x10 3/uL      Abs Eos Automated 0.17  x10 3/uL      Absolute Baso Automated 0.06 x10 3/uL      Absolute Immature Granulocyte 0.09 (H) x10 3/uL      Absolute NRBC 0.00 x10 3/uL     i-Stat CG4 Venous CartrIDge [[id number]] Collected:  01/01/18 1254     Updated:  01/01/18 1306     i-STAT pH Venous 7.449     i-STAT pCO2 Venous 41.5     i-STAT pO2 Venous 35.0     i-STAT HCO3 Bicarbonate Venous 28.6 mEq/L      i-STAT Total CO2 Venous 30.0 mEq/L      i-STAT Base Excess Venous 4.0 mEq/L      i-STAT O2 Saturation Venous 67.0 %      i-STAT Lactic acid 1.1 mmol/L      i-STAT Patient Temperature 100.2 F     i-STAT FIO2 21     i-STAT O2 Delivery Room  Air     i-STAT Allen's Test NA     i-STAT ECMO No     i-STAT Draw Site Venous          Recent CBC   Recent Labs      01/01/18   1254   RBC  4.98   Hgb  14.4   Hematocrit  44.0*   MCV  88.4   MCH  28.9   MCHC  32.7   RDW  13   MPV  8.8*       Rads:   Radiological Procedure reviewed.    Signed by: Marcelino Scot

## 2018-01-02 NOTE — Progress Notes (Signed)
Ambulated pt 5 times around the unit. Pt has steady gait. Pt is attempting to void and is report difficulty starting stream. Will reassess.

## 2018-01-02 NOTE — Progress Notes (Signed)
[facility] INTERNAL [facility]ICINE CONSULT    Date Time: 01/02/18 5:13 PM  Patient Name: Chelsea Montoya D  Attending Physician: Alonna Buckler, MD  Primary Care Physician: Dante Gang, MD    CC: wound infection      History of Presenting Illness:   Chelsea Montoya is a 62 y.o. female w/ hx of HTN, MVP, crohn's, asthma, arthritis, seizures, anxiety, thyroid nodule, abnormal R kidney. She was admitted 7/3 with herniated nucleus pulposus and underwent a planned ACDF C3-7 w. Dr. Kizzie Bane without complication. She was discharged POD #2 7/5. On 7/8, she developed fever and nausea and thus went to see Dr. Kizzie Bane whom advised ER where she was found to have an infection at the incision site. Imaging of the wound showed a new large fluid soft tissue collection suspicious for abscess  now s/p washout/I&D 7/9. Medicine was consulted for evaluation of new onset urinary incontinence and possible UTI. ID is following for management of the wound, recommending cefepime and vanc    Past Medical History:     Past Medical History:   Diagnosis Date   . Anemia     resolved   . Anxiety    . Arrhythmia     followed by cardio    . Arthritis     osteoarthritis   . Asthma without status asthmaticus     allergy-induced only- LD 09/2017   . Blood transfusion without reported diagnosis     14 years ago   . Complication of anesthesia     wake up fast   . Crohn's colitis 01/31/2012   . Crohn's disease     remission - colonoscopy    . Duplicated right renal collecting system    . Heart murmur     followed by cardio    . Hypertension     Runs 120/78's   . Insomnia    . Kidney stone    . Mitral valve prolapse    . Pneumonia    . Pyelonephritis    . Seizures     Just once in 1978- stress induced    . Shortness of breath     R/T allergy   . Thyroid nodule     BILATERAL 09/2014; RT LOBE THYROID NODULE BX - BENIGN   . Vision abnormalities        Past Surgical History:     Past Surgical History:   Procedure Laterality Date   . APPENDECTOMY  2000's   .  BUNIONECTOMY Right 1998   . CLOSED REDUCTION, PERCUTANEOUS FIXATION, FINGER  10/28/2012    Procedure: CLOSED REDUCTION, PERCUTANEOUS FIXATION, FINGER;  Surgeon: Arta Bruce, MD;  Location: [facility];  Service: Orthopedics;  Laterality: Left;  Percutaneous Fixation of Left Ring Finger.     . COLECTOMY  1988    colon resection   . COLONOSCOPY W/ BIOPSIES  02/2016 2019    Dr Cinda Quest - 1 POLYP, INT HEMORRHOIDS, CROHN'S IN REMISSION - RTN 2021    . ELBOW SURGERY Right 2010   . ENDOMETRIAL BIOPSY  04/22/2014    RARE SUPERFICIAL ENDOMETRIAL GLANDS AMIDST MUCUS.TISSUE IS INSUFFICIENT FOR THE OPTIMAL EVALUATION OF THE  ENDOMETRIAL CAVITY.   Lavenia Atlas, ANTERIOR CERVICAL, LEVELS 3+ N/A 12/26/2017    Procedure: FUSION, ANTERIOR CERVICAL, LEVELS 3+;  Surgeon: Kizzie Ide, MD;  Location: [facility];  Service: Orthopedics;  Laterality: N/A;  C3-C7 ANTERIOR CERVICAL DISCECTOMY & FUSION   . GYNECOLOGIC CRYOSURGERY  1970's   .  INCISION & DRAINAGE, WOUND N/A 01/01/2018    Procedure: INCISION & DRAINAGE, WOUND;  Surgeon: Kizzie Ide, MD;  Location: [facility];  Service: Orthopedics;  Laterality: N/A;   . KNEE ARTHROPLASTY Right 2001, 2016   . KNEE ARTHROPLASTY Left 2009   . THYROID NODULE BIOPSY  09/2014    RT LOBE THYROID NODULE       Family History:     Family History   Problem Relation Age of Onset   . Ovarian cancer Paternal Aunt 60        died of complications of ovarian ca   . Lung cancer Mother         complications of lung ca   . Kidney failure Father    . Ovarian cancer Paternal Aunt         survivor   . Uterine cancer Maternal Grandmother 50        survivior   . Breast cancer Paternal Grandmother    . Colon cancer Neg Hx        Social History:     Social History     Social History   . Marital status: Married     Spouse name: N/A   . Number of children: N/A   . Years of education: N/A     Social History Main Topics   . Smoking status: Never Smoker   . Smokeless tobacco: Never Used   . Alcohol use Yes       Comment: occasionally   . Drug use: No   . Sexual activity: Yes     Partners: Male     Birth control/ protection: None     Other Topics Concern   . Not on file     Social History Narrative   . No narrative on file       Allergies:     Allergies   Allergen Reactions   . Flexeril [Cyclobenzaprine] Nausea And Vomiting     Flu like symptoms   . Augmentin [Amoxicillin-Pot Clavulanate] Nausea And Vomiting   . Darvon Other (See Comments)     Gastric distress     . Keflex [Cephalexin] Other (See Comments)     Gastric upset.   . Latex    . Ciprofloxacin Rash       Medications:     Prescriptions Prior to Admission   Medication Sig   . albuterol (PROAIR HFA) 108 (90 Base) MCG/ACT inhaler USE 1-2 PUFFS EVERY 4 HOURS AS NEEDED   . dicyclomine (BENTYL) 20 MG tablet TAKE 1/2 TABLET BY MOUTH THREE TIMES A DAY AS NEEDED (10mg )   . fluticasone-salmeterol (ADVAIR DISKUS) 250-50 MCG/DOSE Aerosol Powder, Breath Activtivatede as needed   . Loratadine-Pseudoephedrine (CLARITIN-D 12 HOUR PO) Take by mouth as needed       . Omeprazole (PRILOSEC PO) Take by mouth Dose unknown   . oxyCODONE-acetaminophen (PERCOCET) 5-325 MG per tablet Take by mouth.       Current Facility-Administered Medications   Medication Dose Route Frequency   . albuterol  2 puff Inhalation TID   . buPROPion XL  300 mg Oral QAM   . cefepime  2 g Intravenous Q8H   . cetirizine  10 mg Oral Daily   . dicyclomine  10 mg Oral BID   . fluticasone furoate-vilanterol  1 puff Inhalation QAM   . furosemide  10 mg Oral BID   . senna-docusate  1 tablet Oral BID   . vancomycin  1,500 mg Intravenous  Q12H       Review of Systems:   All other systems were reviewed and are negative except: as above.    Physical Exam:     Vitals:    01/02/18 1554   BP: 126/60   Pulse: 89   Resp: 17   Temp: 98 F (36.7 C)   SpO2: 92%       Intake and Output Summary (Last 24 hours) at Date Time    Intake/Output Summary (Last 24 hours) at 01/02/18 1713  Last data filed at 01/02/18 1105   Gross per 24  hour   Intake             1650 ml   Output             1392 ml   Net              258 ml       General: awake, alert, oriented x 3; no acute distress.  HEENT: perrla, eomi, sclera anicteric  oropharynx clear without lesions, mucous membranes moist  Neck: supple, no lymphadenopathy, no thyromegaly, no JVD, no carotid bruits  Cardiovascular: regular rate and rhythm, no murmurs, rubs or gallops  Lungs: clear to auscultation bilaterally, without wheezing, rhonchi, or rales  Abdomen: soft, non-tender, non-distended; no palpable masses, no hepatosplenomegaly, normoactive bowel sounds, no rebound or guarding  Extremities: no clubbing, cyanosis, or edema  Neuro: cranial nerves grossly intact, strength 5/5 in upper and lower extremities, sensation intact,   Skin: no rashes or lesions noted, surgical dressing intact with drain  GU: Right sided CVA tenderness   Labs:     Results     Procedure Component Value Units Date/Time    Fungal Culture & Smear [[id number]] Collected:  01/01/18 1820    Specimen:  Other from Tissue Updated:  01/02/18 1526    Narrative:       ORDER#: [id number]                                    ORDERED BY: Rachael Fee  SOURCE: Tissue Deep Neck Wound #2                    COLLECTED:  01/01/18 18:20  ANTIBIOTICS AT COLL.:                                RECEIVED :  01/02/18 03:07  Stain, Fungal                              FINAL       01/02/18 15:25  01/02/18   No Fungal or Yeast Elements Seen  Culture Fungus                             PENDING      Fungal Culture & Smear [[id number]] Collected:  01/01/18 1820    Specimen:  Other from Tissue Updated:  01/02/18 1526    Narrative:       ORDER#: [id number]                                    ORDERED BY: Rachael Fee  SOURCE: Tissue  Deep Neck Wound #1                    COLLECTED:  01/01/18 18:20  ANTIBIOTICS AT COLL.:                                RECEIVED :  01/02/18 02:56  Stain, Fungal                              FINAL       01/02/18 15:25  01/02/18   No  Fungal or Yeast Elements Seen  Culture Fungus                             PENDING      Blood Culture Aerobic/Anaerobic #2 [[id number]] Collected:  01/01/18 1303    Specimen:  Arm from Blood, Venipuncture Updated:  01/02/18 1521    Narrative:       ORDER#: [id number]                                    ORDERED BY: Baldwin Jamaica  SOURCE: Blood, Venipuncture steriapth                COLLECTED:  01/01/18 13:03  ANTIBIOTICS AT COLL.:                                RECEIVED :  01/01/18 14:56  Culture Blood Aerobic and Anaerobic        PRELIM      01/02/18 15:21  01/02/18   No Growth after 1 day/s of incubation.      Blood Culture Aerobic/Anaerobic #1 [[id number]] Collected:  01/01/18 1254    Specimen:  Arm from Blood, Intravenous Line Updated:  01/02/18 1521    Narrative:       ORDER#: [id number]                                    ORDERED BY: Baldwin Jamaica  SOURCE: Blood, Intravenous Line steripath r ac       COLLECTED:  01/01/18 12:54  ANTIBIOTICS AT COLL.:                                RECEIVED :  01/01/18 14:56  Culture Blood Aerobic and Anaerobic        PRELIM      01/02/18 15:21  01/02/18   No Growth after 1 day/s of incubation.      ADULT Urinalysis Reflex to Microscopic Exam - Reflex to Culture [[id number]]  (Abnormal) Collected:  01/02/18 1007     Updated:  01/02/18 1103     Urine Type Urine, Catheriz     Color, UA Yellow     Clarity, UA Clear     Specific Gravity UA 1.015     Urine pH 5.0     Leukocyte Esterase, UA Negative     Nitrite, UA Negative     Protein, UR Negative     Glucose, UA Negative     Ketones UA Negative  Urobilinogen, UA Normal mg/dL      Bilirubin, UA Negative     Blood, UA Small (A)     RBC, UA 3 - 5 /hpf      WBC, UA 0 - 5 /hpf      Squamous Epithelial Cells, Urine 0 - 5 /hpf      Hyaline Casts, UA 0 - 2 /lpf     Narrative:       Neuro change w/ incontinence.  Circulating nurse to place foley.  Please obtain urinalysis  and culture at that time.  Replace urinary catheter prior to obtaining  the urine culture  if it has been in place for greater than or equal to 14  days:->Yes  Indications for U/A Reflex to Micro - Reflex to  Culture:->Other (please specify in Comments)    Comprehensive metabolic panel [[id number]]  (Abnormal) Collected:  01/02/18 1004    Specimen:  Blood Updated:  01/02/18 1053     Glucose 122 (H) mg/dL      BUN 8.0 mg/dL      Creatinine 0.6 mg/dL      Sodium 829 mEq/L      Potassium 4.4 mEq/L      Chloride 105 mEq/L      CO2 27 mEq/L      Calcium 8.6 mg/dL      Protein, Total 6.0 g/dL      Albumin 2.7 (L) g/dL      AST (SGOT) 12 U/L      ALT 11 U/L      Alkaline Phosphatase 56 U/L      Bilirubin, Total 0.5 mg/dL      Globulin 3.3 g/dL      Albumin/Globulin Ratio 0.8 (L)    GFR [[id number]] Collected:  01/02/18 1004     Updated:  01/02/18 1053     EGFR >60.0    CBC without differential [[id number]]  (Abnormal) Collected:  01/02/18 1004    Specimen:  Blood from Blood Updated:  01/02/18 1040     WBC 10.65 (H) x10 3/uL      Hgb 12.2 g/dL      Hematocrit 28.4 %      Platelets 300 x10 3/uL      RBC 4.19 x10 6/uL      MCV 90.0 fL      MCH 29.1 pg      MCHC 32.4 g/dL      RDW 13 %      MPV 8.5 (L) fL      Nucleated RBC 0.0 /100 WBC      Absolute NRBC 0.00 x10 3/uL     Culture + Gram Stain,Aerobic, Wound [[id number]] Collected:  01/01/18 1820    Specimen:  Wound from Wound Updated:  01/02/18 0549    Narrative:       ORDER#: [id number]                                    ORDERED BY: Rachael Fee  SOURCE: Wound Deep Neck Wound #1                     COLLECTED:  01/01/18 18:20  ANTIBIOTICS AT COLL.:                                RECEIVED :  01/02/18 02:56  Stain, Gram  FINAL       01/02/18 05:49  01/02/18   Rare WBCs             No organisms seen             No Squamous epithelial cells seen  Culture and Gram Stain, Aerobic, Wound     PENDING      Culture + Gram Stain,Aerobic, Wound [[id number]] Collected:  01/01/18 1820    Specimen:  Wound from Wound Updated:  01/02/18  0546    Narrative:       ORDER#: [id number]                                    ORDERED BY: Rachael Fee  SOURCE: Wound Deep Neck Wound #2                     COLLECTED:  01/01/18 18:20  ANTIBIOTICS AT COLL.:                                RECEIVED :  01/02/18 03:07  Stain, Gram                                FINAL       01/02/18 05:46  01/02/18   No WBCs or organisms seen             No Squamous epithelial cells seen  Culture and Gram Stain, Aerobic, Wound     PENDING      Culture, Anaerobic Bacteria [[id number]] Collected:  01/01/18 1820    Specimen:  Other from Wound Updated:  01/02/18 0307    Culture, Anaerobic Bacteria [[id number]] Collected:  01/01/18 1820    Specimen:  Other from Wound Updated:  01/02/18 0256          Radiology Results (24 Hour)     ** No results found for the last 24 hours. **            Assessment:     Active Hospital Problems    Diagnosis   . Wound infection after surgery   . Urinary incontinence   . Leukocytosis   . Fever   . Urinary retention   . S/P cervical discectomy   . Postoperative fever       This is a 62 y.o. female w/ hx of HTN, MVP, crohn's, asthma, arthritis, seizures, anxiety, thyroid nodule, herniated nucleus pulposus and underwent a planned ACDF C3-7 w. Dr. Kizzie Bane, now here with wound infection. Medicine consult for evaluation of new onset urinary incontinence (recent foley) and possible UTI    Recommendations:   #Urinary incontinence/retention  -UA w/ small blood no leuks or nitrites(obtained after pt received abx)  -UC pending, further recs to follow, pt very prone to pyelonephritis  -currently on abx for wound infection  -apprec ID following  -WBC's downtrending, currently afebrile, HDS  -bladder scan ordered, pt retaining 900+ 4 hours after foley removal  -monitor retention, straight cath per protocol (discussed with the RN)  -wound and drain care management per primary team    Hx of HTN, MVP, crohn's, asthma, arthritis, seizures, anxiety, thyroid nodule-noted    Dr.  Kizzie Bane, thank you for the consultation. We will follow the patient with you.    Signed by: Romie Minus  Manson Passey, FNP  [id number], Cyndra Numbers, MD   Discussed with attending Dr. Elpidio Anis

## 2018-01-02 NOTE — Progress Notes (Signed)
Spine navigator spoke to patient regarding plan of care, pain management, IS teaching, spine precautions, ambulation, voiding and [place] planning, patient states is having a lot of pain, was given pain meds, patient is sleepy at this time but easily arousal. Spoke to patient and bedside RN regarding ambulation. Patient should be ambulating in hallway with assist atleast 3 times her day. Encourage IS and wean patient off oxygen. Patient states still having discomfort swallowing. Encouraged fluids. Cont to monitor drain. Will f/u with patient as needed

## 2018-01-02 NOTE — Progress Notes (Signed)
Pt weaned off O2. Pt achieving 93% O2 sat on RA. Will continue to monitor.

## 2018-01-02 NOTE — Progress Notes (Deleted)
Report given to pre-op nurse. Patient ready for transport. Now off unit with husband. Belongings left in room.

## 2018-01-02 NOTE — Plan of Care (Signed)
Problem: Moderate/High Fall Risk Score >5  Goal: Patient will remain free of falls  Outcome: Progressing   01/02/18 0825   OTHER   High (Greater than 13) HIGH-Activate bed/chair exit alarm where available     Fall risk assessed and placed on white board. Educated pt on Architectural technologist. Pt wearing non slip socks. Pt stood and ambulated with steady gait without assistive device. Educated pt on call bell system/white board and its use, pt/family verbalized understanding. During hourly rounding, bed side table, water, personal items, and call bell placed in reach of pt.     Problem: Pain  Goal: Pain at adequate level as identified by patient  Outcome: Progressing   01/02/18 1527   Goal/Interventions addressed this shift   Pain at adequate level as identified by patient Identify patient comfort function goal;Reassess pain within 30-60 minutes of any procedure/intervention, per Pain Assessment, Intervention, Reassessment (AIR) Cycle     Educated pt on numeric pain scale.     Pt stated pain level of "10-8-7/10" with a comfort function goal of "3/10". Administered PRN Percocet PO, reported not effective. Administered Dilaudid IV, pt reported minimal relief. Paged MD, Valium added to care plan. Pt reported adding this is effective.    Educated pt on PRN medication for pain and updated white board displaying next time medication is available, pt verbalized understanding.     Offered non-pharmocological interventions for pt comfort; closed room door to reduce noise, turned on/off lights to pt's preference, obtained pt PRN food/drink, help pt to reposition, assisted pt with TV/reading material/therapeutic conversation distraction.       Problem: Anterior/Posterior Cervical Discectomy/Fusion  Goal: Free from infection  Outcome: Progressing   01/02/18 1527   Goal/Interventions addressed this shift   Free from infection  Monitor/assess vital signs;Assess for signs and symptoms of infection;Teach/reinforce use of incentive  spirometer 10 times per hour while awake, cough and deep breath as needed     Pt has anterior neck dressing, gauze/tegaderm CDI. J/P drain, sanguinous drainage noted. IV ABX being administered.   Goal: Hemodynamic stability  Outcome: Progressing   01/02/18 1527   Goal/Interventions addressed this shift   Hemodynamic stability Monitor/assess vital signs;Monitor SpO2 and treat as needed     VSS, pt O2 sat 93% on RA. Will provide O2 if pt requires it.

## 2018-01-02 NOTE — Progress Notes (Signed)
Patient has ambulated in hallway 5 times per nursing, pain is better controlled, and using IS. Cont to encourage ambulation and IS.

## 2018-01-03 LAB — VANCOMYCIN, TROUGH: Vancomycin Trough: 10.2 ug/mL (ref 10.0–20.0)

## 2018-01-03 LAB — CBC
Absolute NRBC: 0 10*3/uL (ref 0.00–0.00)
Hematocrit: 37.1 % (ref 34.7–43.7)
Hgb: 11.8 g/dL (ref 11.4–14.8)
MCH: 28.8 pg (ref 25.1–33.5)
MCHC: 31.8 g/dL (ref 31.5–35.8)
MCV: 90.5 fL (ref 78.0–96.0)
MPV: 8.7 fL — ABNORMAL LOW (ref 8.9–12.5)
Nucleated RBC: 0 /100 WBC (ref 0.0–0.0)
Platelets: 282 10*3/uL (ref 142–346)
RBC: 4.1 10*6/uL (ref 3.90–5.10)
RDW: 13 % (ref 11–15)
WBC: 8.28 10*3/uL (ref 3.10–9.50)

## 2018-01-03 LAB — COMPREHENSIVE METABOLIC PANEL
ALT: 10 U/L (ref 0–55)
AST (SGOT): 16 U/L (ref 5–34)
Albumin/Globulin Ratio: 0.8 — ABNORMAL LOW (ref 0.9–2.2)
Albumin: 2.7 g/dL — ABNORMAL LOW (ref 3.5–5.0)
Alkaline Phosphatase: 56 U/L (ref 37–106)
BUN: 6 mg/dL — ABNORMAL LOW (ref 7.0–19.0)
Bilirubin, Total: 0.3 mg/dL (ref 0.2–1.2)
CO2: 25 mEq/L (ref 22–29)
Calcium: 8.5 mg/dL (ref 8.5–10.5)
Chloride: 106 mEq/L (ref 100–111)
Creatinine: 0.6 mg/dL (ref 0.6–1.0)
Globulin: 3.2 g/dL (ref 2.0–3.6)
Glucose: 118 mg/dL — ABNORMAL HIGH (ref 70–100)
Potassium: 4.2 mEq/L (ref 3.5–5.1)
Protein, Total: 5.9 g/dL — ABNORMAL LOW (ref 6.0–8.3)
Sodium: 137 mEq/L (ref 136–145)

## 2018-01-03 LAB — SEDIMENTATION RATE: Sed Rate: 50 mm/Hr — ABNORMAL HIGH (ref 0–20)

## 2018-01-03 LAB — C-REACTIVE PROTEIN: C-Reactive Protein: 5.2 mg/dL — ABNORMAL HIGH (ref 0.0–0.8)

## 2018-01-03 LAB — GFR: EGFR: >60

## 2018-01-03 LAB — PREALBUMIN: Prealbumin: 11 mg/dL — ABNORMAL LOW (ref 16.0–38.0)

## 2018-01-03 MED ORDER — TAMSULOSIN HCL 0.4 MG PO CAPS
0.40 mg | ORAL_CAPSULE | Freq: Every day | ORAL | Status: DC
Start: 2018-01-03 — End: 2018-01-05
  Administered 2018-01-03 – 2018-01-04 (×2): 0.4 mg via ORAL
  Filled 2018-01-03 (×2): qty 1

## 2018-01-03 NOTE — Plan of Care (Signed)
Problem: Moderate/High Fall Risk Score >5  Goal: Patient will remain free of falls  Outcome: Progressing   01/02/18 0825 01/03/18 0835   OTHER   Moderate Risk (6-13) --  MOD-([facility]H Only) Yellow "Fall Risk" signage;MOD-([facility] Only) Yellow slippers;MOD-Initiate Yellow "Fall Risk" magnet communication tool;MOD-Consider activation of bed alarm if appropriate;MOD-Floor mat at bedside (where available) if appropriate   High (Greater than 13) HIGH-Activate bed/chair exit alarm where available --      Pt verbalizes understanding of falls risk protocol and calls for assist. Bed is in low position, bedside table is nearby, call bell is within reach. No injury of falls. Will continue to monitor.      Problem: Pain  Goal: Pain at adequate level as identified by patient  Outcome: Progressing   01/02/18 2144   Goal/Interventions addressed this shift   Pain at adequate level as identified by patient Identify patient comfort function goal;Assess pain on admission, during daily assessment and/or before any "as needed" intervention(s);Reassess pain within 30-60 minutes of any procedure/intervention, per Pain Assessment, Intervention, Reassessment (AIR) Cycle;Evaluate if patient comfort function goal is met;Evaluate patient's satisfaction with pain management progress;Offer non-pharmacological pain management interventions;Include patient/patient care companion in decisions related to pain management as needed;Assess for risk of opioid induced respiratory depression, including snoring/sleep apnea. Alert healthcare team of risk factors identified.;Consult/collaborate with Physical Therapy, Occupational Therapy, and/or Speech Therapy     Pt reports pain "7/10" with comfort function goal of "3/10". Administered PRN Oxycodone PO, pt reported effective. Educated pt on availability of PRN meds, pt verbalized understanding. Pt able to turn self for comfort. Will continue to assess pt pain/comfort.      Problem: Anterior/Posterior Cervical  Discectomy/Fusion  Goal: Free from infection  Outcome: Progressing   01/02/18 2144   Goal/Interventions addressed this shift   Free from infection  Monitor/assess vital signs;Assess for signs and symptoms of infection;Assess surgical dressing, reinforce or change as needed per order;Teach/reinforce use of incentive spirometer 10 times per hour while awake, cough and deep breath as needed;Administer and discontinue antibiotics per SCIP measures;Foley maintenance care if Foley in place. Utilize indwelling urinary catheter policy/protocol ([place]).     Pt shows no signs of infection. Temp is within normal range (97.5), BP is 133/73. Pt is not tachy, not diaphoretic. Will continue to monitor.  Goal: Hemodynamic stability  Outcome: Progressing   01/02/18 2144   Goal/Interventions addressed this shift   Hemodynamic stability Monitor/assess vital signs;Monitor SpO2 and treat as needed;Monitor intake and output. Notify LIP if urine output is less than 240 mL in 8 hours.;Monitor/assess output from surgical drain if present;Monitor/assess lab values. Notify LIP of abnormal results.     Vitals are stable, O sat is at 99% on room air, dressing is clean, dry and intact. Will continue to monitor.  Goal: Effective airway  Outcome: Progressing   01/02/18 2144   Goal/Interventions addressed this shift   Effective airway  Assess respiratory rate, O2 saturation, and work of breathing;Monitor/assess output from surgical drain if present;Complete Anterior Neck Surgery assessment flowsheet;Maintain surgical airway kit or tracheostomy tray at bedside;Assess surgical site for hematoma development;Remove surgical dressing if necessary;Assess and monitor patient's ability to speak;Monitor/assess patient's ability to swallow and manage oral secretions;Assess for vocal spasms (quality and character of voice)     Pt can swallow pills whole, drink and eat with minimum difficulty. Will continue to monitor.

## 2018-01-03 NOTE — Progress Notes (Signed)
[facility] HOSPITALIST PROGRESS NOTE    Date Time: 01/03/18 5:08 PM  Patient Name: Chelsea Montoya, Chelsea Montoya      Assessment:   This is a 62 y.o. female w/ hx of HTN, MVP, crohn's, asthma, arthritis, seizures, anxiety, thyroid nodule, herniated nucleus pulposus and underwent a planned ACDF C3-7 w. Dr. Kizzie Bane, now here with wound infection, s/p I&D of cervical wound     New onset urinary incontinence, now has foley for retention    Plan:   bl and urine cx NGTD  On antibiotics, per ID  Pain improved.  Leave foley in for now, started on Flomax   Wound and drain mx per NSGY      Subjective:   Pain better.  Has developed urinary retention now, has foley placed.   No fever     Medications:     Current Facility-Administered Medications   Medication Dose Route Frequency   . albuterol  2 puff Inhalation TID   . buPROPion XL  300 mg Oral QAM   . cefepime  2 g Intravenous Q8H   . cetirizine  10 mg Oral Daily   . dicyclomine  10 mg Oral BID   . fluticasone furoate-vilanterol  1 puff Inhalation QAM   . furosemide  10 mg Oral BID   . senna-docusate  1 tablet Oral BID   . tamsulosin  0.4 mg Oral QD after dinner   . vancomycin  1,500 mg Intravenous Q12H       Review of Systems:   A comprehensive review of systems was: General ROS: negative for - chills, fatigue or fever  Respiratory ROS: no cough, shortness of breath, or wheezing  Cardiovascular ROS: no chest pain or dyspnea on exertion  Gastrointestinal ROS: no abdominal pain, change in bowel habits, or black or bloody stools  Genito-Urinary ROS: positive for - retention   Musculoskeletal ROS: positive for - neck pain and right arm weakness    Physical Exam:     Vitals:    01/03/18 1056   BP: 134/63   Pulse: 83   Resp: 18   Temp: 98.8 F (37.1 C)   SpO2: 92%       Intake and Output Summary (Last 24 hours) at Date Time    Intake/Output Summary (Last 24 hours) at 01/03/18 1708  Last data filed at 01/03/18 1200   Gross per 24 hour   Intake                0 ml   Output             4563 ml   Net             -4563 ml       General: awake, alert, oriented x 3; no acute distress.  Neck: supple, no lymphadenopathy, no thyromegaly, no JVD,   Cardiovascular: regular rate and rhythm, no murmurs, rubs or gallops  Lungs: clear to auscultation bilaterally, without wheezing, rhonchi, or rales  Abdomen: soft, non-tender, non-distended; no palpable masses, no hepatosplenomegaly, normoactive bowel sounds, no rebound or guarding  Extremities: no clubbing, cyanosis, or edema  Neuro: cranial nerves grossly intact, strength 5/5 in upper and lower extremities, sensation intact,   Skin: no rashes or lesions noted, surgical dressing intact with drain      Labs:     Results     Procedure Component Value Units Date/Time    Blood Culture Aerobic/Anaerobic #1 [[id number]] Collected:  01/01/18 1254    Specimen:  Arm from  Blood, Intravenous Line Updated:  01/03/18 1521    Narrative:       ORDER#: [id number]                                    ORDERED BY: Baldwin Jamaica  SOURCE: Blood, Intravenous Line steripath r ac       COLLECTED:  01/01/18 12:54  ANTIBIOTICS AT COLL.:                                RECEIVED :  01/01/18 14:56  Culture Blood Aerobic and Anaerobic        PRELIM      01/03/18 15:21  01/02/18   No Growth after 1 day/s of incubation.  01/03/18   No Growth after 2 day/s of incubation.      Blood Culture Aerobic/Anaerobic #2 [[id number]] Collected:  01/01/18 1303    Specimen:  Arm from Blood, Venipuncture Updated:  01/03/18 1521    Narrative:       ORDER#: [id number]                                    ORDERED BY: Baldwin Jamaica  SOURCE: Blood, Venipuncture steriapth                COLLECTED:  01/01/18 13:03  ANTIBIOTICS AT COLL.:                                RECEIVED :  01/01/18 14:56  Culture Blood Aerobic and Anaerobic        PRELIM      01/03/18 15:21  01/02/18   No Growth after 1 day/s of incubation.  01/03/18   No Growth after 2 day/s of incubation.      Adult Urine culture [[id number]] Collected:  01/03/18 0940    Specimen:   Urine from Urine, Catheterized, Foley Updated:  01/03/18 0940    Narrative:       Replace urinary catheter prior to obtaining the urine culture  if it has been in place for greater than or equal to 14  days:->No  Indications for Urine Culture:->Suprapubic Pain/Tenderness or  Dysuria    C Reactive Protein [[id number]]  (Abnormal) Collected:  01/03/18 0426    Specimen:  Blood Updated:  01/03/18 0859     C-Reactive Protein 5.2 (H) mg/dL     Prealbumin [[id number]]  (Abnormal) Collected:  01/03/18 0426    Specimen:  Blood Updated:  01/03/18 0859     Prealbumin 11.0 (L) mg/dL     Sedimentation rate (ESR) [[id number]]  (Abnormal) Collected:  01/03/18 0426    Specimen:  Blood Updated:  01/03/18 0856     Sed Rate 50 (H) mm/Hr     Culture + Gram Stain,Aerobic, Wound [[id number]] Collected:  01/01/18 1820    Specimen:  Wound from Wound Updated:  01/03/18 0806    Narrative:       ORDER#: [id number]                                    ORDERED BY: Rachael Fee  SOURCE: Wound Deep Neck Wound #1  COLLECTED:  01/01/18 18:20  ANTIBIOTICS AT COLL.:                                RECEIVED :  01/02/18 02:56  Stain, Gram                                FINAL       01/02/18 05:49  01/02/18   Rare WBCs             No organisms seen             No Squamous epithelial cells seen  Culture and Gram Stain, Aerobic, Wound     PRELIM      01/03/18 08:06  01/03/18   Culture no growth to date, Final report to follow      Culture + Gram Stain,Aerobic, Wound [[id number]] Collected:  01/01/18 1820    Specimen:  Wound from Wound Updated:  01/03/18 0805    Narrative:       ORDER#: [id number]                                    ORDERED BY: Rachael Fee  SOURCE: Wound Deep Neck Wound #2                     COLLECTED:  01/01/18 18:20  ANTIBIOTICS AT COLL.:                                RECEIVED :  01/02/18 03:07  Stain, Gram                                FINAL       01/02/18 05:46  01/02/18   No WBCs or organisms seen             No  Squamous epithelial cells seen  Culture and Gram Stain, Aerobic, Wound     PRELIM      01/03/18 08:05  01/03/18   Culture no growth to date, Final report to follow      Comprehensive metabolic panel [[id number]]  (Abnormal) Collected:  01/03/18 0436    Specimen:  Blood Updated:  01/03/18 0538     Glucose 118 (H) mg/dL      BUN 6.0 (L) mg/dL      Creatinine 0.6 mg/dL      Sodium 678 mEq/L      Potassium 4.2 mEq/L      Chloride 106 mEq/L      CO2 25 mEq/L      Calcium 8.5 mg/dL      Protein, Total 5.9 (L) g/dL      Albumin 2.7 (L) g/dL      AST (SGOT) 16 U/L      ALT 10 U/L      Alkaline Phosphatase 56 U/L      Bilirubin, Total 0.3 mg/dL      Globulin 3.2 g/dL      Albumin/Globulin Ratio 0.8 (L)    GFR [[id number]] Collected:  01/03/18 0436     Updated:  01/03/18 0538     EGFR >60.0    Vancomycin, trough [[id number]] Collected:  01/03/18 0436    Specimen:  Blood Updated:  01/03/18 0521     Vancomycin Trough 10.2 ug/mL      Vancomycin Time of Last Dose unk     Vancomycin Date of Last Dose 01/02/2018    Narrative:       Prior to vancomycin dose    CBC without differential [[id number]]  (Abnormal) Collected:  01/03/18 0436    Specimen:  Blood from Blood Updated:  01/03/18 0506     WBC 8.28 x10 3/uL      Hgb 11.8 g/dL      Hematocrit 40.9 %      Platelets 282 x10 3/uL      RBC 4.10 x10 6/uL      MCV 90.5 fL      MCH 28.8 pg      MCHC 31.8 g/dL      RDW 13 %      MPV 8.7 (L) fL      Nucleated RBC 0.0 /100 WBC      Absolute NRBC 0.00 x10 3/uL               Rads:   Radiological Procedure reviewed.    Signed by: Lavonna Rua

## 2018-01-03 NOTE — Progress Notes (Addendum)
ID PROGRESS NOTE      Spectra: [phone]    Office: [phone]    Date Time: 01/03/18 @NOW   Patient Name: Chelsea Montoya, Chelsea Montoya    Problem List:    Acute Problem List:   Fever  Neck pain  -- 01/01/18 Cervical CT -  progressive edema/fluid involving the operative bed involving the cervical soft tissues on the left, with progressive prevertebral soft tissue swelling diffusely  -- blood cx pending     Herniated nucleus pulposus              -- s/p 12/17/17 - Anterior/Posterior Cervical Discectomy/Fusion              --S/P I&D Cervical Wound I&D; purulence encountered        Chronic Conditions:  Crohn's  Mitral valve prolapse  Anxiety   Asthma     All: cipro (rash)    Assessment:   62 yo female recently anterior/posterir cervical fusion/discectomy   Coming in for concern for surgical site wound infection  Now S/P Cervical Wound I&D ; 01/01/18    Afebrile, WBC decreased (17--->8K)  Cr=0.6  LFTs and Bilirubin normal  LA=1.1  UA negative  Operative Wound Cultures NGTD--->Gram Stains unimpressive  Blood Cultures from 01/01/18 NGTD  No new imaging data    Noted GI upset with Keflex - not true allergy     Antimicrobials:   #3 Cefepime 2g IV q8H  #3 Vancomycin 1500mg  IV q12H; Trough ~10    Plan:   Continue dual antibiotic regimen  Will need PICC or Midline but choice pending culture result which will predict antibiotic selection (and therefore which line is appropriate)  Await final culture data  ESR and CRP as baselines    MRSA Screen was negative 12/18/17    She lives in [place], [place] with her Husband and one adult child    [facility] Continuum of Care Program  [phone]  This patient is being evaluated for the need for intravenous antibiotics after discharge    Diagnosis requiring antibiotics:    ANTIBIOTIC RECOMMENDATION/DISCHARGE PLANNING  Recommendation for Definitive outpatient Antibiotic:     Discharge Destination:   Home   Discharge Planning:       ID requirements for Discharge:    Follow up ID appointment  placed:    Discussed with Case Management:    Home Health Face-to-Face Placed:    IV ACCESS RECOMMENDATIONS: IV Access Status:   Preferred Post Discharge IV Access:      For any questions regarding home outpatient antibiotics, please call [phone]    Lines:     Patient Lines/Drains/Airways Status    Active PICC Line / CVC Line / PIV Line / Drain / Airway / Intraosseous Line / Epidural Line / ART Line / Line / Wound / Pressure Ulcer / NG/OG Tube     Name:   Placement date:   Placement time:   Site:   Days:    Peripheral IV 01/01/18 Right Antecubital  01/01/18    1252    Antecubital    1    Peripheral IV 01/01/18 Left Hand  01/01/18    1303    Hand    1    Closed/Suction Drain Left;Anterior Neck Bulb 7 Fr.  12/26/17    1144    Neck    7    Closed/Suction Drain Anterior Neck Bulb 10 Fr.  01/01/18    1837    Neck    1  Urethral Catheter 16 Fr.  01/02/18    1822        less than 1                *I have performed a risk-benefit analysis and the patient needs a central line for access and IV medications    Family History:     Family History   Problem Relation Age of Onset   . Ovarian cancer Paternal Aunt 60        died of complications of ovarian ca   . Lung cancer Mother         complications of lung ca   . Kidney failure Father    . Ovarian cancer Paternal Aunt         survivor   . Uterine cancer Maternal Grandmother 50        survivior   . Breast cancer Paternal Grandmother    . Colon cancer Neg Hx        Social History:     Social History     Social History   . Marital status: Married     Spouse name: N/A   . Number of children: N/A   . Years of education: N/A     Social History Main Topics   . Smoking status: Never Smoker   . Smokeless tobacco: Never Used   . Alcohol use Yes      Comment: occasionally   . Drug use: No   . Sexual activity: Yes     Partners: Male     Birth control/ protection: None     Other Topics Concern   . Not on file     Social History Narrative   . No narrative on file       Allergies:      Allergies   Allergen Reactions   . Flexeril [Cyclobenzaprine] Nausea And Vomiting     Flu like symptoms   . Augmentin [Amoxicillin-Pot Clavulanate] Nausea And Vomiting   . Darvon Other (See Comments)     Gastric distress     . Keflex [Cephalexin] Other (See Comments)     Gastric upset.   . Latex    . Ciprofloxacin Rash       Review of Systems:   General ROS: negative for - chills, fevers, night sweats, weight loss   HEENT: L-sided neck wound discomfort at rest and with movement but improved  Respiratory ROS: negative for cough, SOB  Cardiovascular ROS: negative for - chest pain, palpitations   Gastrointestinal ROS: negative for - abdominal pain, nausea, vomiting, diarrhea  Genito-Urinary ROS: negative for - dysuria, urinary frequency/urgency   Musculoskeletal ROS: negative for - joint pain, joint stiffness or muscle pain   Dermatological ROS: negative for - rash and skin lesion changes   Neurological ROS: negative for - confusion, headache, dizziness  Hematological ROS: negative for - bruising, bleeding   Psychological ROS: stable mood    Physical Exam:     Vitals:    01/03/18 0334   BP: 147/68   Pulse: 93   Resp: 20   Temp: 98.7 F (37.1 C)   SpO2: 94%       General Appearance: alert and appropriate, non-toxic  Neuro: alert, oriented, normal speech, normal attention and cognition  HEENT: no scleral icterus, pupils round and reactive, OP clear  Neck: decreased ROM due to L neck dressing  Lungs: clear to auscultation, no wheezes, rales or rhonchi, symmetric air entry   Cardiac: normal  rate, regular rhythm, normal S1, S2,   Abdomen: soft, non-tender, non-distended, normal active bowel sounds  Extremities: no pedal edema  Skin: no rash  Psych upbeat affect    Labs:     Lab Results   Component Value Date    WBC 8.28 01/03/2018    HGB 11.8 01/03/2018    HCT 37.1 01/03/2018    MCV 90.5 01/03/2018    PLT 282 01/03/2018     Lab Results   Component Value Date    CREAT 0.6 01/03/2018     Lab Results   Component Value Date     ALT 10 01/03/2018    AST 16 01/03/2018    ALKPHOS 56 01/03/2018    BILITOTAL 0.3 01/03/2018     No results found for: LACTATE    Microbiology:     Microbiology Results     Procedure Component Value Units Date/Time    Blood Culture Aerobic/Anaerobic #1 [[id number]] Collected:  01/01/18 1254    Specimen:  Arm from Blood, Intravenous Line Updated:  01/02/18 1521    Narrative:       ORDER#: [id number]                                    ORDERED BY: Baldwin Jamaica  SOURCE: Blood, Intravenous Line steripath r ac       COLLECTED:  01/01/18 12:54  ANTIBIOTICS AT COLL.:                                RECEIVED :  01/01/18 14:56  Culture Blood Aerobic and Anaerobic        PRELIM      01/02/18 15:21  01/02/18   No Growth after 1 day/s of incubation.      Blood Culture Aerobic/Anaerobic #2 [[id number]] Collected:  01/01/18 1303    Specimen:  Arm from Blood, Venipuncture Updated:  01/02/18 1521    Narrative:       ORDER#: [id number]                                    ORDERED BY: Baldwin Jamaica  SOURCE: Blood, Venipuncture steriapth                COLLECTED:  01/01/18 13:03  ANTIBIOTICS AT COLL.:                                RECEIVED :  01/01/18 14:56  Culture Blood Aerobic and Anaerobic        PRELIM      01/02/18 15:21  01/02/18   No Growth after 1 day/s of incubation.      Culture + Gram Stain,Aerobic, Wound [[id number]] Collected:  01/01/18 1820    Specimen:  Wound from Wound Updated:  01/02/18 0549    Narrative:       ORDER#: [id number]                                    ORDERED BY: Rachael Fee  SOURCE: Wound Deep Neck Wound #1  COLLECTED:  01/01/18 18:20  ANTIBIOTICS AT COLL.:                                RECEIVED :  01/02/18 02:56  Stain, Gram                                FINAL       01/02/18 05:49  01/02/18   Rare WBCs             No organisms seen             No Squamous epithelial cells seen  Culture and Gram Stain, Aerobic, Wound     PENDING      Culture + Gram Stain,Aerobic, Wound [[id number]]  Collected:  01/01/18 1820    Specimen:  Wound from Wound Updated:  01/02/18 0546    Narrative:       ORDER#: [id number]                                    ORDERED BY: Rachael Fee  SOURCE: Wound Deep Neck Wound #2                     COLLECTED:  01/01/18 18:20  ANTIBIOTICS AT COLL.:                                RECEIVED :  01/02/18 03:07  Stain, Gram                                FINAL       01/02/18 05:46  01/02/18   No WBCs or organisms seen             No Squamous epithelial cells seen  Culture and Gram Stain, Aerobic, Wound     PENDING      Culture, Anaerobic Bacteria [[id number]] Collected:  01/01/18 1820    Specimen:  Other from Wound Updated:  01/02/18 0307    Culture, Anaerobic Bacteria [[id number]] Collected:  01/01/18 1820    Specimen:  Other from Wound Updated:  01/02/18 0256    Fungal Culture & Smear [[id number]] Collected:  01/01/18 1820    Specimen:  Other from Tissue Updated:  01/02/18 1526    Narrative:       ORDER#: [id number]                                    ORDERED BY: Rachael Fee  SOURCE: Tissue Deep Neck Wound #2                    COLLECTED:  01/01/18 18:20  ANTIBIOTICS AT COLL.:                                RECEIVED :  01/02/18 03:07  Stain, Fungal                              FINAL  01/02/18 15:25  01/02/18   No Fungal or Yeast Elements Seen  Culture Fungus                             PENDING      Fungal Culture & Smear [[id number]] Collected:  01/01/18 1820    Specimen:  Other from Tissue Updated:  01/02/18 1526    Narrative:       ORDER#: [id number]                                    ORDERED BY: Rachael Fee  SOURCE: Tissue Deep Neck Wound #1                    COLLECTED:  01/01/18 18:20  ANTIBIOTICS AT COLL.:                                RECEIVED :  01/02/18 02:56  Stain, Fungal                              FINAL       01/02/18 15:25  01/02/18   No Fungal or Yeast Elements Seen  Culture Fungus                             PENDING            Rads:   No results found.    Signed  by: Tonita Phoenix., MD

## 2018-01-03 NOTE — Progress Notes (Signed)
PROGRESS NOTE    Date Time: 01/03/18 9:14 AM  Patient Name: Chelsea Montoya, Chelsea Montoya      Assessment:   Post op day # 2 s/p I and Montoya of cervical wound    Plan:   1.  S/p I and Montoya  -Pain much improved w/ prn valium and oxycodone regimen.  Pt up and walking.  Improving C5 palsy.  Encouraged pt to con't to work on home exercises.  JP drain w/ minimal output, but will remain until Montoya/c due to recent infection.  ID following.  Cultures pending.  Have discussed case w/ Dr. Marshell Garfinkel.  2.  Urinary retention  -foley placed late yesterday.  Medicine team following for poss infection; appreciate the consult  -will start flomax    Subjective:   Pt is post op day # 2 s/p I and Montoya of cervical wound.  Pt reports improved pain.  Has been working on raising her arm and admits improved function.  Has been up and walking.  Pt reports unable to urinate.  Foley placed.      Medications:     Current Facility-Administered Medications   Medication Dose Route Frequency   . albuterol  2 puff Inhalation TID   . buPROPion XL  300 mg Oral QAM   . cefepime  2 g Intravenous Q8H   . cetirizine  10 mg Oral Daily   . dicyclomine  10 mg Oral BID   . fluticasone furoate-vilanterol  1 puff Inhalation QAM   . furosemide  10 mg Oral BID   . senna-docusate  1 tablet Oral BID   . vancomycin  1,500 mg Intravenous Q12H       Review of Systems:   A comprehensive review of systems was: Pos for neck pain and right arm weakness    Physical Exam:     Vitals:    01/03/18 0658   BP: 133/73   Pulse: 78   Resp: 18   Temp: 97.5 F (36.4 C)   SpO2: 99%       Intake and Output Summary (Last 24 hours) at Date Time    Intake/Output Summary (Last 24 hours) at 01/03/18 0914  Last data filed at 01/03/18 0251   Gross per 24 hour   Intake                0 ml   Output             3563 ml   Net            -3563 ml       WD/WN: NAD; A &O x 4  A:  Neg  Dressing:  C/Montoya/I  JP drain:  Functioning  Ext:  Right arm w/ C5 palsy.  C6-8 5/5.  Left arm 5/5 C4-8    Labs:     Results     Procedure Component  Value Units Date/Time    C Reactive Protein [[id number]]  (Abnormal) Collected:  01/03/18 0426    Specimen:  Blood Updated:  01/03/18 0859     C-Reactive Protein 5.2 (H) mg/dL     Prealbumin [[id number]]  (Abnormal) Collected:  01/03/18 0426    Specimen:  Blood Updated:  01/03/18 0859     Prealbumin 11.0 (L) mg/dL     Sedimentation rate (ESR) [[id number]]  (Abnormal) Collected:  01/03/18 0426    Specimen:  Blood Updated:  01/03/18 0856     Sed Rate 50 (H) mm/Hr     Culture + Gram  Azzie Glatter Wound [[id number]] Collected:  01/01/18 1820    Specimen:  Wound from Wound Updated:  01/03/18 0806    Narrative:       ORDER#: [id number]                                    ORDERED BY: Rachael Fee  SOURCE: Wound Deep Neck Wound #1                     COLLECTED:  01/01/18 18:20  ANTIBIOTICS AT COLL.:                                RECEIVED :  01/02/18 02:56  Stain, Gram                                FINAL       01/02/18 05:49  01/02/18   Rare WBCs             No organisms seen             No Squamous epithelial cells seen  Culture and Gram Stain, Aerobic, Wound     PRELIM      01/03/18 08:06  01/03/18   Culture no growth to date, Final report to follow      Culture + Gram Stain,Aerobic, Wound [[id number]] Collected:  01/01/18 1820    Specimen:  Wound from Wound Updated:  01/03/18 0805    Narrative:       ORDER#: [id number]                                    ORDERED BY: Rachael Fee  SOURCE: Wound Deep Neck Wound #2                     COLLECTED:  01/01/18 18:20  ANTIBIOTICS AT COLL.:                                RECEIVED :  01/02/18 03:07  Stain, Gram                                FINAL       01/02/18 05:46  01/02/18   No WBCs or organisms seen             No Squamous epithelial cells seen  Culture and Gram Stain, Aerobic, Wound     PRELIM      01/03/18 08:05  01/03/18   Culture no growth to date, Final report to follow      Comprehensive metabolic panel [[id number]]  (Abnormal) Collected:  01/03/18 0436    Specimen:  Blood  Updated:  01/03/18 0538     Glucose 118 (H) mg/dL      BUN 6.0 (L) mg/dL      Creatinine 0.6 mg/dL      Sodium 244 mEq/L      Potassium 4.2 mEq/L      Chloride 106 mEq/L      CO2 25 mEq/L      Calcium 8.5 mg/dL  Protein, Total 5.9 (L) g/dL      Albumin 2.7 (L) g/dL      AST (SGOT) 16 U/L      ALT 10 U/L      Alkaline Phosphatase 56 U/L      Bilirubin, Total 0.3 mg/dL      Globulin 3.2 g/dL      Albumin/Globulin Ratio 0.8 (L)    GFR [[id number]] Collected:  01/03/18 0436     Updated:  01/03/18 0538     EGFR >60.0    Vancomycin, trough [[id number]] Collected:  01/03/18 0436    Specimen:  Blood Updated:  01/03/18 0521     Vancomycin Trough 10.2 ug/mL      Vancomycin Time of Last Dose unk     Vancomycin Date of Last Dose 01/02/2018    Narrative:       Prior to vancomycin dose    CBC without differential [[id number]]  (Abnormal) Collected:  01/03/18 0436    Specimen:  Blood from Blood Updated:  01/03/18 0506     WBC 8.28 x10 3/uL      Hgb 11.8 g/dL      Hematocrit 08.6 %      Platelets 282 x10 3/uL      RBC 4.10 x10 6/uL      MCV 90.5 fL      MCH 28.8 pg      MCHC 31.8 g/dL      RDW 13 %      MPV 8.7 (L) fL      Nucleated RBC 0.0 /100 WBC      Absolute NRBC 0.00 x10 3/uL     Fungal Culture & Smear [[id number]] Collected:  01/01/18 1820    Specimen:  Other from Tissue Updated:  01/02/18 1526    Narrative:       ORDER#: [id number]                                    ORDERED BY: Rachael Fee  SOURCE: Tissue Deep Neck Wound #2                    COLLECTED:  01/01/18 18:20  ANTIBIOTICS AT COLL.:                                RECEIVED :  01/02/18 03:07  Stain, Fungal                              FINAL       01/02/18 15:25  01/02/18   No Fungal or Yeast Elements Seen  Culture Fungus                             PENDING      Fungal Culture & Smear [[id number]] Collected:  01/01/18 1820    Specimen:  Other from Tissue Updated:  01/02/18 1526    Narrative:       ORDER#: [id number]                                    ORDERED BY:  Rachael Fee  SOURCE: Tissue Deep Neck Wound #1  COLLECTED:  01/01/18 18:20  ANTIBIOTICS AT COLL.:                                RECEIVED :  01/02/18 02:56  Stain, Fungal                              FINAL       01/02/18 15:25  01/02/18   No Fungal or Yeast Elements Seen  Culture Fungus                             PENDING      Blood Culture Aerobic/Anaerobic #2 [[id number]] Collected:  01/01/18 1303    Specimen:  Arm from Blood, Venipuncture Updated:  01/02/18 1521    Narrative:       ORDER#: [id number]                                    ORDERED BY: Baldwin Jamaica  SOURCE: Blood, Venipuncture steriapth                COLLECTED:  01/01/18 13:03  ANTIBIOTICS AT COLL.:                                RECEIVED :  01/01/18 14:56  Culture Blood Aerobic and Anaerobic        PRELIM      01/02/18 15:21  01/02/18   No Growth after 1 day/s of incubation.      Blood Culture Aerobic/Anaerobic #1 [[id number]] Collected:  01/01/18 1254    Specimen:  Arm from Blood, Intravenous Line Updated:  01/02/18 1521    Narrative:       ORDER#: [id number]                                    ORDERED BY: Baldwin Jamaica  SOURCE: Blood, Intravenous Line steripath r ac       COLLECTED:  01/01/18 12:54  ANTIBIOTICS AT COLL.:                                RECEIVED :  01/01/18 14:56  Culture Blood Aerobic and Anaerobic        PRELIM      01/02/18 15:21  01/02/18   No Growth after 1 day/s of incubation.      ADULT Urinalysis Reflex to Microscopic Exam - Reflex to Culture [[id number]]  (Abnormal) Collected:  01/02/18 1007     Updated:  01/02/18 1103     Urine Type Urine, Catheriz     Color, UA Yellow     Clarity, UA Clear     Specific Gravity UA 1.015     Urine pH 5.0     Leukocyte Esterase, UA Negative     Nitrite, UA Negative     Protein, UR Negative     Glucose, UA Negative     Ketones UA Negative     Urobilinogen, UA Normal mg/dL      Bilirubin, UA Negative     Blood, UA Small (A)  RBC, UA 3 - 5 /hpf      WBC, UA 0 - 5 /hpf       Squamous Epithelial Cells, Urine 0 - 5 /hpf      Hyaline Casts, UA 0 - 2 /lpf     Narrative:       Neuro change w/ incontinence.  Circulating nurse to place foley.  Please obtain urinalysis  and culture at that time.  Replace urinary catheter prior to obtaining the urine culture  if it has been in place for greater than or equal to 14  days:->Yes  Indications for U/A Reflex to Micro - Reflex to  Culture:->Other (please specify in Comments)    Comprehensive metabolic panel [[id number]]  (Abnormal) Collected:  01/02/18 1004    Specimen:  Blood Updated:  01/02/18 1053     Glucose 122 (H) mg/dL      BUN 8.0 mg/dL      Creatinine 0.6 mg/dL      Sodium 409 mEq/L      Potassium 4.4 mEq/L      Chloride 105 mEq/L      CO2 27 mEq/L      Calcium 8.6 mg/dL      Protein, Total 6.0 g/dL      Albumin 2.7 (L) g/dL      AST (SGOT) 12 U/L      ALT 11 U/L      Alkaline Phosphatase 56 U/L      Bilirubin, Total 0.5 mg/dL      Globulin 3.3 g/dL      Albumin/Globulin Ratio 0.8 (L)    GFR [[id number]] Collected:  01/02/18 1004     Updated:  01/02/18 1053     EGFR >60.0    CBC without differential [[id number]]  (Abnormal) Collected:  01/02/18 1004    Specimen:  Blood from Blood Updated:  01/02/18 1040     WBC 10.65 (H) x10 3/uL      Hgb 12.2 g/dL      Hematocrit 57.8 %      Platelets 300 x10 3/uL      RBC 4.19 x10 6/uL      MCV 90.0 fL      MCH 29.1 pg      MCHC 32.4 g/dL      RDW 13 %      MPV 8.5 (L) fL      Nucleated RBC 0.0 /100 WBC      Absolute NRBC 0.00 x10 3/uL           Recent CBC   Recent Labs      01/03/18   0436   RBC  4.10   Hgb  11.8   Hematocrit  37.1   MCV  90.5   MCH  28.8   MCHC  31.8   RDW  13   MPV  8.7*       Rads:   Radiological Procedure reviewed.    Signed by: Marcelino Scot

## 2018-01-03 NOTE — OT Progress Note (Addendum)
[facility]   Occupational Therapy Treatment     Patient: Chelsea Montoya    MRN#: [id number]   Unit: [facility] CCW GROUND UNIT  Bed: FG30/FG30.01      Discharge Recommendations:   Discharge Recommendation: Home with supervision, Home with outpatient OT   DME Recommended for Discharge:  (in place at home per Pt)    If Home with supervision, Home with outpatient OT recommended discharge disposition is not available, patient will need SBA assist for ADL, above listed equipment, and HHOT with S at home.     Assessment:   Pt received sitting in bed. Pt is progressing well towards goals and reports that she has assist at home from her husband as needed. Pt requires verbal cues for safe hand placement and is able to ambulate without AD but looks steadier when using the IV pole for support. Pt reports that she has a walker at home that she plans to utilize.  Pt may benefit from skilled OT to maximize ADL independence and functional mobility. Pt is Appropriate for D/C to home with OP OT to address UE RUE deficits following acute care hospital stay.        Assessment  Assessment: decreased strength;balance deficits;decreased independence with ADLs;decreased independence with IADLs  Prognosis: Good;With continued OT s/p acute discharge  Progress: Progressing toward goals  Patient left without needs and call bell within reach. RN notified of session outcome.       Treatment Activities: ADL retraining, functional mobility, functional transfer training    Educated the patient to role of occupational therapy, plan of care, goals of therapy and safety with mobility and ADLs, energy conservation techniques, pursed lip breathing.    Plan:   OT Frequency Recommended: 2-3x/wk     Goal Formulation: Patient  OT Plan  Risks/Benefits/POC Discussed with Pt/Family: With patient  Treatment Interventions: ADL retraining, Functional transfer training, UE strengthening/ROM, Endurance training  Discharge Recommendation:  Home with supervision, Home with outpatient OT  DME Recommended for Discharge:  (in place at home per Pt)  OT Frequency Recommended: 2-3x/wk  OT - Next Visit Recommendation: 01/04/18    Continue plan of care.       Precautions and Contraindications:   Precautions  Weight Bearing Status: no restrictions  Other Precautions: falls    Updated Medical Status/Imaging/Labs:  Lab Results   Component Value Date/Time    HGB 11.8 01/03/2018 04:36 AM    HCT 37.1 01/03/2018 04:36 AM    K 4.2 01/03/2018 04:36 AM    NA 137 01/03/2018 04:36 AM     Subjective:   Patient's medical condition is appropriate for Occupational Therapy intervention at this time.  Patient is agreeable to participation in the therapy session. Nursing clears patient for therapy.    Pain Assessment  Pain Assessment: No/denies pain   .    Objective:   Observation of Patient/Vital Signs:  Patient is in bed with peripheral IV in place.    Cognition/Neuro Status  Arousal/Alertness: Appropriate responses to stimuli  Attention Span: Attends to task with redirection  Orientation Level: Oriented X4  Following Commands: Follows one step commands without difficulty  Safety Awareness: minimal verbal instruction  Insights: Fully aware of deficits  Problem Solving: supervision  Behavior: calm;cooperative  Motor Planning: intact  Coordination: intact    Functional Mobility  Supine to Sit Transfers: Supervision  Sit to Supine Transfers: Supervision  Stand to Sit Transfers: Supervision  Bed to Toilet Transfers: Stand by Assist (with IV  pole support)  Toilet Transfers: Stand by Assist  Functional Mobility/Ambulation: Stand by Assist    Self-care and Home Management  Eating: Independent (coughing after eating, RN made aware)  Grooming: Stand by Assist;standing at sink;wash/dry hands  Bathing: Contact Guard Assist;sitting in shower  UB Dressing: Stand by Assist  LB Dressing: Minimal Assist  Toileting: Stand by Assist;sitting  Functional Transfers: Stand by Assist                         PMP Activity: Step 6 - Walks in Room               Participation: good  Endurance: good-      Patient left with call bell within reach, all needs met, SCDs in place, fall mat in place, and all questions answered. RN notified of session outcome and patient response.       Goals:    Time For Goal Achievement: 3 visits  ADL Goals  Patient will groom self: Stand by Assist, at sinkside, Goal met  Patient will dress upper body: Stand by Assist, with hemi tech, Goal met  Mobility and Transfer Goals  Pt will transfer bed to toilet: Stand by Assist, Goal met  Neuro Re-Ed Goals  Pt will perform dynamic standing balance: Stand by Assist, for 15 minutes, to complete standing ADLs safely       Nanine Means   Pager# [phone] 01/03/2018      Time of Treatment  OT Received On: 01/03/18  Start Time: 0948  Stop Time: 1020  Time Calculation (min): 32 min

## 2018-01-03 NOTE — Plan of Care (Signed)
Please note that an order for UA w/ reflex to culture was written on 7/10, however since the UA was "clean" the sample was not reflexed. Will order a culture, RN made aware. Culture obtained after several doses of antibiotics  Pt required foley to be replaced yesterday due to urinary retention    Further recs from attending to follow

## 2018-01-03 NOTE — Progress Notes (Signed)
Notified PT department regarding pt's limited mobility to the RUE. See recommendations from previous day from therapist. Pt upper arm deficits are recommended to be addressed in the outpatient setting. Pt does ambulate with steady gait and is able to function even with limited mobility.

## 2018-01-03 NOTE — Progress Notes (Signed)
Spine navigator spoke to patient regarding plan of care. Patient states her pain is well controlled, still having discomfort swallowing. Patient is drowsy but easily arousal. Patient encouraged to ambulate more. Patient has weakness in RUE, encouraged arm exercise. PT consulted for C5 palsy to RUE. Patient states she feels depressed staying in hospital, offered therapy, psych consult patient refused at this time. Spoke to bedside RN to encourage patient OOB ambulate with assist and also assist with RUE exercise. Bedside RN will page PT. Patient voiding without difficulty. No further questions at this time

## 2018-01-04 LAB — COMPREHENSIVE METABOLIC PANEL
ALT: 13 U/L (ref 0–55)
AST (SGOT): 20 U/L (ref 5–34)
Albumin/Globulin Ratio: 0.8 — ABNORMAL LOW (ref 0.9–2.2)
Albumin: 2.7 g/dL — ABNORMAL LOW (ref 3.5–5.0)
Alkaline Phosphatase: 57 U/L (ref 37–106)
BUN: 4 mg/dL — ABNORMAL LOW (ref 7.0–19.0)
Bilirubin, Total: 0.3 mg/dL (ref 0.2–1.2)
CO2: 28 mEq/L (ref 22–29)
Calcium: 9 mg/dL (ref 8.5–10.5)
Chloride: 102 mEq/L (ref 100–111)
Creatinine: 0.6 mg/dL (ref 0.6–1.0)
Globulin: 3.3 g/dL (ref 2.0–3.6)
Glucose: 105 mg/dL — ABNORMAL HIGH (ref 70–100)
Potassium: 4 mEq/L (ref 3.5–5.1)
Protein, Total: 6 g/dL (ref 6.0–8.3)
Sodium: 137 mEq/L (ref 136–145)

## 2018-01-04 LAB — CBC
Absolute NRBC: 0 10*3/uL (ref 0.00–0.00)
Hematocrit: 36 % (ref 34.7–43.7)
Hgb: 11.6 g/dL (ref 11.4–14.8)
MCH: 28.8 pg (ref 25.1–33.5)
MCHC: 32.2 g/dL (ref 31.5–35.8)
MCV: 89.3 fL (ref 78.0–96.0)
MPV: 8.5 fL — ABNORMAL LOW (ref 8.9–12.5)
Nucleated RBC: 0 /100 WBC (ref 0.0–0.0)
Platelets: 266 10*3/uL (ref 142–346)
RBC: 4.03 10*6/uL (ref 3.90–5.10)
RDW: 12 % (ref 11–15)
WBC: 6.2 10*3/uL (ref 3.10–9.50)

## 2018-01-04 LAB — GFR: EGFR: >60

## 2018-01-04 MED ORDER — HYDROMORPHONE HCL 0.5 MG/0.5 ML IJ SOLN
0.50 mg | Freq: Three times a day (TID) | INTRAMUSCULAR | Status: DC | PRN
Start: 2018-01-04 — End: 2018-01-05
  Administered 2018-01-04 – 2018-01-05 (×3): 0.5 mg via INTRAVENOUS
  Filled 2018-01-04 (×3): qty 1

## 2018-01-04 MED ORDER — DAPTOMYCIN 500 MG IV SOLR
5.00 mg/kg | INTRAVENOUS | Status: DC
Start: 2018-01-04 — End: 2018-01-05
  Administered 2018-01-04: 19:00:00 500 mg via INTRAVENOUS
  Filled 2018-01-04 (×2): qty 500

## 2018-01-04 NOTE — Progress Notes (Signed)
ORTHOPEDICS SPINE  PROGRESS NOTE      Patient: Chelsea Montoya  Date: 01/04/2018   LOS: 3 Days  Admission Date: 01/01/2018   MRN: [id number]  Attending: Alonna Buckler, MD     ASSESSMENT     KASIAH MANKA is a 62 y.o. female admitted with anterior neck fluid collection 1 week following C3-C7 ACDF. She underwent I&D yesterday.  Right C5 strength is improving.  Drain output aprrox 10mL overnight  Stains and cultures negative. Discussed with Dr. Jon Billings. He suggested a 6 to 8wk course of Daptomycin and Cefepime and will write for them.    PLAN     PICC line pending  Remove drain tomorrow.  Anticipate d/c tomorrow.  Arrange home IV Abx and PICC line care .  Follow-up with ID in 1 week and Dr. Kizzie Bane in 2-3 weeks.  Follow-up with urology.    SUBJECTIVE     Ms. Marschall reports neck pain and right arm strength are improving.  Denies new symptoms.  Ambulating in hall.      MEDICATIONS     Current Facility-Administered Medications   Medication Dose Route Frequency   . albuterol  2 puff Inhalation TID   . buPROPion XL  300 mg Oral QAM   . cefepime  2 g Intravenous Q8H   . cetirizine  10 mg Oral Daily   . dicyclomine  10 mg Oral BID   . fluticasone furoate-vilanterol  1 puff Inhalation QAM   . furosemide  10 mg Oral BID   . senna-docusate  1 tablet Oral BID   . tamsulosin  0.4 mg Oral QD after dinner   . vancomycin  1,500 mg Intravenous Q12H         PHYSICAL EXAM     Visit Vitals  BP 132/68   Pulse 87   Temp 98.5 F (36.9 C) (Oral)   Resp 16   Ht 1.549 m (5\' 1" )   Wt 95.7 kg (210 lb 15.7 oz)   SpO2 94%   BMI 39.86 kg/m       Temperature: Temp  Min: 96.4 F (35.8 C)  Max: 98.8 F (37.1 C)  Pulse: Pulse  Min: 77  Max: 91  Respiratory: Resp  Min: 15  Max: 16  Non-Invasive BP: BP  Min: 117/60  Max: 142/72  Pulse Oximetry SpO2  Min: 93 %  Max: 96 %    Intake and Output Summary (Last 24 hours) at Date Time    Intake/Output Summary (Last 24 hours) at 01/04/18 1548  Last data filed at 01/04/18 0618   Gross per 24 hour   Intake              2700 ml   Output             2800 ml   Net             -100 ml     Neck dressing dry.    NEURO:  Motor:  4/5 right and 5/5 left elbow  flexion              5/5 finger abduction, wrist extension, elbow flexion, elbow extension              5/5 bilateral toe extension, ankle plantar flexion, ankle dorsilfexion, knee extension, hip adduction  Sens:   Reduced intensity of LT right thumb and index finger      LABS       Recent Labs  Lab 01/04/18  1610 01/03/18  0436 01/02/18  1004   WBC 6.20 8.28 10.65*   RBC 4.03 4.10 4.19   Hgb 11.6 11.8 12.2   Hematocrit 36.0 37.1 37.7   MCV 89.3 90.5 90.0   Platelets 266 282 300         Recent Labs  Lab 01/04/18  0336 01/03/18  0436 01/02/18  1004 01/01/18  1253   Sodium 137 137 136 135*   Potassium 4.0 4.2 4.4 4.7   Chloride 102 106 105 100   CO2 28 25 27 26    BUN 4.0* 6.0* 8.0 11.0   Creatinine 0.6 0.6 0.6 0.7   Glucose 105* 118* 122* 133*   Calcium 9.0 8.5 8.6 9.5         Recent Labs  Lab 01/04/18  0336 01/03/18  0436 01/02/18  1004   ALT 13 10 11    AST (SGOT) 20 16 12    Bilirubin, Total 0.3 0.3 0.5   Albumin 2.7* 2.7* 2.7*   Alkaline Phosphatase 57 56 56                   Microbiology Results     Procedure Component Value Units Date/Time    Adult Urine culture [[id number]] Collected:  01/03/18 0940    Specimen:  Urine from Urine, Catheterized, Foley Updated:  01/04/18 1046    Narrative:       Replace urinary catheter prior to obtaining the urine culture  if it has been in place for greater than or equal to 14  days:->No  Indications for Urine Culture:->Suprapubic Pain/Tenderness or  Dysuria    Blood Culture Aerobic/Anaerobic #1 [[id number]] Collected:  01/01/18 1254    Specimen:  Arm from Blood, Intravenous Line Updated:  01/04/18 1521    Narrative:       ORDER#: [id number]                                    ORDERED BY: Baldwin Jamaica  SOURCE: Blood, Intravenous Line steripath r ac       COLLECTED:  01/01/18 12:54  ANTIBIOTICS AT COLL.:                                 RECEIVED :  01/01/18 14:56  Culture Blood Aerobic and Anaerobic        PRELIM      01/04/18 15:21  01/02/18   No Growth after 1 day/s of incubation.  01/03/18   No Growth after 2 day/s of incubation.  01/04/18   No Growth after 3 day/s of incubation.      Blood Culture Aerobic/Anaerobic #2 [[id number]] Collected:  01/01/18 1303    Specimen:  Arm from Blood, Venipuncture Updated:  01/04/18 1521    Narrative:       ORDER#: [id number]                                    ORDERED BY: Baldwin Jamaica  SOURCE: Blood, Venipuncture steriapth                COLLECTED:  01/01/18 13:03  ANTIBIOTICS AT COLL.:  RECEIVED :  01/01/18 14:56  Culture Blood Aerobic and Anaerobic        PRELIM      01/04/18 15:21  01/02/18   No Growth after 1 day/s of incubation.  01/03/18   No Growth after 2 day/s of incubation.  01/04/18   No Growth after 3 day/s of incubation.      Culture + Gram Stain,Aerobic, Wound [[id number]] Collected:  01/01/18 1820    Specimen:  Wound from Wound Updated:  01/04/18 0942    Narrative:       ORDER#: [id number]                                    ORDERED BY: Rachael Fee  SOURCE: Wound Deep Neck Wound #1                     COLLECTED:  01/01/18 18:20  ANTIBIOTICS AT COLL.:                                RECEIVED :  01/02/18 02:56  Stain, Gram                                FINAL       01/02/18 05:49  01/02/18   Rare WBCs             No organisms seen             No Squamous epithelial cells seen  Culture and Gram Stain, Aerobic, Wound     PRELIM      01/04/18 09:42  01/03/18   Culture no growth to date, Final report to follow  01/04/18   Culture no growth to date, Final report to follow      Culture + Gram Stain,Aerobic, Wound [[id number]] Collected:  01/01/18 1820    Specimen:  Wound from Wound Updated:  01/04/18 0941    Narrative:       ORDER#: [id number]                                    ORDERED BY: Rachael Fee  SOURCE: Wound Deep Neck Wound #2                     COLLECTED:   01/01/18 18:20  ANTIBIOTICS AT COLL.:                                RECEIVED :  01/02/18 03:07  Stain, Gram                                FINAL       01/02/18 05:46  01/02/18   No WBCs or organisms seen             No Squamous epithelial cells seen  Culture and Gram Stain, Aerobic, Wound     PRELIM      01/04/18 09:41  01/03/18   Culture no growth to date, Final report to follow  01/04/18   Culture no growth to date, Final report to follow  Culture, Anaerobic Bacteria [[id number]] Collected:  01/01/18 1820    Specimen:  Other from Wound Updated:  01/04/18 0933    Narrative:       ORDER#: [id number]                                    ORDERED BY: Rachael Fee  SOURCE: Wound Deep Neck Wound #2                     COLLECTED:  01/01/18 18:20  ANTIBIOTICS AT COLL.:                                RECEIVED :  01/02/18 03:07  Culture, Anaerobic Bacteria                PRELIM      01/04/18 09:33  01/04/18   No growth to date, final report to follow      Culture, Anaerobic Bacteria [[id number]] Collected:  01/01/18 1820    Specimen:  Other from Wound Updated:  01/04/18 0933    Narrative:       ORDER#: [id number]                                    ORDERED BY: Rachael Fee  SOURCE: Wound Deep Neck Wound #1                     COLLECTED:  01/01/18 18:20  ANTIBIOTICS AT COLL.:                                RECEIVED :  01/02/18 02:56  Culture, Anaerobic Bacteria                PRELIM      01/04/18 09:33  01/04/18   No growth to date, final report to follow      Fungal Culture & Smear [[id number]] Collected:  01/01/18 1820    Specimen:  Other from Tissue Updated:  01/02/18 1526    Narrative:       ORDER#: [id number]                                    ORDERED BY: Rachael Fee  SOURCE: Tissue Deep Neck Wound #2                    COLLECTED:  01/01/18 18:20  ANTIBIOTICS AT COLL.:                                RECEIVED :  01/02/18 03:07  Stain, Fungal                              FINAL       01/02/18 15:25  01/02/18   No Fungal  or Yeast Elements Seen  Culture Fungus                             PENDING  Fungal Culture & Smear [[id number]] Collected:  01/01/18 1820    Specimen:  Other from Tissue Updated:  01/02/18 1526    Narrative:       ORDER#: [id number]                                    ORDERED BY: Rachael Fee  SOURCE: Tissue Deep Neck Wound #1                    COLLECTED:  01/01/18 18:20  ANTIBIOTICS AT COLL.:                                RECEIVED :  01/02/18 02:56  Stain, Fungal                              FINAL       01/02/18 15:25  01/02/18   No Fungal or Yeast Elements Seen  Culture Fungus                             PENDING             RADIOLOGY     Radiological Procedure reviewed.    MRI Cervical Spine W WO Contrast   Final Result       1. There is a new large fluid collection involving the prevertebral   cervical soft tissues which is suspicious for potential abscess in this   setting. There is no appreciable involvement of the spinal canal.   2. Multilevel degenerative thoracic spondylosis.      Nicoletta Dress, MD    01/01/2018 4:41 PM      MRI Thoracic Spine W WO Contrast   Final Result       1. There is a new large fluid collection involving the prevertebral   cervical soft tissues which is suspicious for potential abscess in this   setting. There is no appreciable involvement of the spinal canal.   2. Multilevel degenerative thoracic spondylosis.      Nicoletta Dress, MD    01/01/2018 4:41 PM      CT Cervical Spine without Contrast   Final Result         1. Again identified are postsurgical changes status post ACDF C3-C7.   There is no evidence of hardware failure.    2. There is evidence of progressive edema/fluid involving the operative   bed involving the cervical soft tissues on the left, with progressive   prevertebral soft tissue swelling diffusely. Neck CT, with contrast, may   be helpful for further characterization as clinically warranted.      Findings were discussed with Dr. Laurie Panda at approximately  1:50   PM on January 01, 2018.      Nicoletta Dress, MD    01/01/2018 1:55 PM              ATTESTATION     Signed,      Christain Sacramento. Hildred Pharo MD MPH  Orthopedics Spine Attending  Pager # [phone]  Cell # [phone]    [facility]  [address]  [place], [place] [address]    Tel: [phone]  Fax: [phone]  [phone]  [email or web address]

## 2018-01-04 NOTE — Plan of Care (Signed)
Problem: Moderate/High Fall Risk Score >5  Goal: Patient will remain free of falls  Outcome: Progressing   01/03/18 2057   OTHER   Moderate Risk (6-13) MOD-Remain with patient during toileting;MOD-(VH Only) Yellow slippers;MOD-Apply bed exit alarm if patient is confused;MOD-Floor mat at bedside (where available) if appropriate       Problem: Safety  Goal: Patient will be free from injury during hospitalization  Outcome: Progressing   01/04/18 0053   Goal/Interventions addressed this shift   Patient will be free from injury during hospitalization  Assess patient's risk for falls and implement fall prevention plan of care per policy;Provide and maintain safe environment;Use appropriate transfer methods;Ensure appropriate safety devices are available at the bedside;Include patient/ family/ care giver in decisions related to safety;Hourly rounding     Goal: Patient will be free from infection during hospitalization  Outcome: Progressing   01/04/18 0053   Goal/Interventions addressed this shift   Free from Infection during hospitalization Assess and monitor for signs and symptoms of infection;Monitor lab/diagnostic results       Problem: Pain  Goal: Pain at adequate level as identified by patient  Outcome: Progressing   01/04/18 0053   Goal/Interventions addressed this shift   Pain at adequate level as identified by patient Identify patient comfort function goal;Assess for risk of opioid induced respiratory depression, including snoring/sleep apnea. Alert healthcare team of risk factors identified.;Reassess pain within 30-60 minutes of any procedure/intervention, per Pain Assessment, Intervention, Reassessment (AIR) Cycle;Assess pain on admission, during daily assessment and/or before any "as needed" intervention(s);Evaluate if patient comfort function goal is met;Offer non-pharmacological pain management interventions;Evaluate patient's satisfaction with pain management progress       Problem: Discharge Barriers  Goal:  Patient will be discharged home or other facility with appropriate resources  Outcome: Progressing   01/04/18 0053   Goal/Interventions addressed this shift   Discharge to home or other facility with appropriate resources Provide appropriate patient education;Provide information on available health resources;Initiate discharge planning       Problem: Psychosocial and Spiritual Needs  Goal: Demonstrates ability to cope with hospitalization/illness  Outcome: Progressing   01/04/18 0053   Goal/Interventions addressed this shift   Demonstrates ability to cope with hospitalizations/illness Encourage verbalization of feelings/concerns/expectations;Provide quiet environment       Problem: Anterior/Posterior Cervical Discectomy/Fusion  Goal: Free from infection  Outcome: Progressing   01/04/18 0053   Goal/Interventions addressed this shift   Free from infection  Monitor/assess vital signs;Assess for signs and symptoms of infection;Assess surgical dressing, reinforce or change as needed per order;Teach/reinforce use of incentive spirometer 10 times per hour while awake, cough and deep breath as needed;Administer and discontinue antibiotics per SCIP measures;Foley maintenance care if Foley in place. Utilize indwelling urinary catheter policy/protocol ([place]).     Goal: Mobility/activity is maintained at optimum level for patient  Outcome: Progressing   01/02/18 2144   Goal/Interventions addressed this shift   Mobility/activity is maintained at optimum level Log roll while in bed;Consult/collaborate with Physical Therapy and Occupational Therapy;Out of bed standing with assistance, prior to midnight;Modified independent/independent ambulation greater than or equal to 50 feet;Keep HOB 30 degrees, unless contraindicated     Goal: Pain at adequate level as identified by patient  Outcome: Progressing   01/04/18 0053   Goal/Interventions addressed this shift   Pain at adequate level as identified by patient Identify and evaluate  patient comfort function goal;Administer analgesics as ordered to achieve patient comfort function goal     Goal: Stable neurovascular status  Outcome: Progressing   01/04/18 0053   Goal/Interventions addressed this shift   Stable neurovascular status Monitor/assess neurovascular status (pulses, capillary refill, pain, paresthesia, presence of edema)     Goal: Effective airway  Outcome: Progressing   01/04/18 0053   Goal/Interventions addressed this shift   Effective airway  Assess respiratory rate, O2 saturation, and work of breathing;Monitor/assess output from surgical drain if present;Complete Anterior Neck Surgery assessment flowsheet;Assess surgical site for hematoma development;Assess and monitor patient's ability to speak;Monitor/assess patient's ability to swallow and manage oral secretions     Goal: Patient/patient care companion demonstrates understanding on disease process, treatment plan, medications and discharge plans  Outcome: Progressing   01/02/18 2144   Goal/Interventions addressed this shift   Patient/patient care companion demonstrates understanding of surgery/treatment plan, medications, and discharge plans Orient to unit;Provide discharge instructions, medication reconciliation, and prescriptions to patient/patient care companion;Provide education on patient medications, medication side effects, dressing changes, activity level, brace instructions and care to patient/patient care companion

## 2018-01-04 NOTE — Progress Notes (Addendum)
ID PROGRESS NOTE      Spectra: [phone]    Office: [phone]    Date Time: 01/04/18 @NOW   Patient Name: Chelsea Montoya, Chelsea Montoya    Problem List:    Acute Problem List:   Fever  Neck pain  -- 01/01/18 Cervical CT - progressive edema/fluid involving the operative bed involving the cervical soft tissues on the left, with progressive prevertebral soft tissue swelling diffusely  -- blood cx pending     Herniated nucleus pulposus  -- s/p 12/17/17 - Anterior/Posterior Cervical Discectomy/Fusion              --S/P I&D Cervical Wound I&D; purulence encountered        Chronic Conditions:  Crohn's  Mitral valve prolapse  Anxiety   Asthma     All: cipro (rash)    Assessment:   62 yo Woman from [place], [place] recently S/P anterior/posterior cervical fusion/discectomy admitted for concern for surgical site wound infection  Now S/P Cervical Wound I&D ; 01/01/18    Afebrile, WBC decreased (17--->6K)  Cr=0.6  LFTs and Bilirubin normal  LA=1.1  CRP~5  ESR=50  Prealbumin=11  UA negative  Operative Wound Cultures NGTD--->Gram Stains unimpressive  Blood Cultures from 01/01/18 NGTD  No new imaging data    Noted GI upset with Keflex - not true allergy       Antimicrobials:   #4 Cefepime 2g IV q8H--->#1 Daptomycin 500 mg IV Q24H  #4 Vancomycin 1500mg  IV q12H; Trough ~10      Plan:   Continue dual antibiotic regimen; plan ~8 weeks of therapy as D/W neurosurgery due to depth of infection in conjunction with hardware in place  PICC ordered; pending  Await final culture data--->NGTD  ESR and CRP baselines in chart; elevated as expected    MRSA Screen was negative 12/18/17    She lives in [place], [place] with her Husband and one adult child    Cultures remain negative through today  Will change to Daptomycin and Cefepime given negative MRSA Screen    [facility]  [phone]  This patient is being evaluated for the need for intravenous antibiotics after discharge    Diagnosis requiring  antibiotics: Epidural abscess   ANTIBIOTIC RECOMMENDATION/DISCHARGE PLANNING  Recommendation for Definitive outpatient Antibiotic:     Discharge Destination:   Home   Discharge Planning:        ID requirements for Discharge: PICC; Antibiotics through 02/22/18  Daptomycin 500 mg IV Q24H  Cefepime 2 gm IV Q12H through 02/22/18   Follow up ID appointment placed: not yet   Discussed with Case Management: consult placed   Home Health Face-to-Face Placed: not yet; awaiting culture data   IV ACCESS RECOMMENDATIONS: IV Access Status: PICC ordered and pending  Preferred Post Discharge IV Access: same     For any questions regarding home outpatient antibiotics, please call [phone]        Lines:     Patient Lines/Drains/Airways Status    Active PICC Line / CVC Line / PIV Line / Drain / Airway / Intraosseous Line / Epidural Line / ART Line / Line / Wound / Pressure Ulcer / NG/OG Tube     Name:   Placement date:   Placement time:   Site:   Days:    Peripheral IV 01/01/18 Right Antecubital  01/01/18    1252    Antecubital    2    Peripheral IV 01/01/18 Left Hand  01/01/18  1303    Hand    2    Closed/Suction Drain Left;Anterior Neck Bulb 7 Fr.  12/26/17    1144    Neck    8    Closed/Suction Drain Anterior Neck Bulb 10 Fr.  01/01/18    1837    Neck    2    Urethral Catheter 16 Fr.  01/02/18    1822        1                *I have performed a risk-benefit analysis and the patient needs a central line for access and IV medications    Family History:     Family History   Problem Relation Age of Onset   . Ovarian cancer Paternal Aunt 60        died of complications of ovarian ca   . Lung cancer Mother         complications of lung ca   . Kidney failure Father    . Ovarian cancer Paternal Aunt         survivor   . Uterine cancer Maternal Grandmother 50        survivior   . Breast cancer Paternal Grandmother    . Colon cancer Neg Hx        Social History:     Social History     Social History   . Marital status:  Married     Spouse name: N/A   . Number of children: N/A   . Years of education: N/A     Social History Main Topics   . Smoking status: Never Smoker   . Smokeless tobacco: Never Used   . Alcohol use Yes      Comment: occasionally   . Drug use: No   . Sexual activity: Yes     Partners: Male     Birth control/ protection: None     Other Topics Concern   . Not on file     Social History Narrative   . No narrative on file       Allergies:     Allergies   Allergen Reactions   . Flexeril [Cyclobenzaprine] Nausea And Vomiting     Flu like symptoms   . Augmentin [Amoxicillin-Pot Clavulanate] Nausea And Vomiting   . Darvon Other (See Comments)     Gastric distress     . Keflex [Cephalexin] Other (See Comments)     Gastric upset.   . Latex    . Ciprofloxacin Rash       Review of Systems:   General ROS: negative for - chills, fevers, night sweats, weight loss   HEENT: decreased L-sided neck swelling; wound dressing intact  Respiratory ROS: negative for cough, SOB  Cardiovascular ROS: negative for - chest pain, palpitations   Gastrointestinal ROS: negative for - abdominal pain, nausea, vomiting, diarrhea  Genito-Urinary ROS: negative for - dysuria, urinary frequency/urgency   Musculoskeletal ROS: negative for - joint pain, joint stiffness or muscle pain   Dermatological ROS: negative for - rash and skin lesion changes   Neurological ROS: negative for - confusion, headache, dizziness  Hematological ROS: negative for - bruising, bleeding   Psychological ROS: negative for - changes in mood    Physical Exam:     Vitals:    01/04/18 0325   BP: 117/60   Pulse: 77   Resp: 16   Temp: (!) 96.4 F (35.8 C)   SpO2: 93%  General Appearance: alert and appropriate, non-toxic  Neuro: alert, oriented, normal speech, normal attention and cognition  HEENT: no scleral icterus, pupils round and reactive, OP clear  Neck: decreased ROM due to L neck dressing  Lungs: clear to auscultation, no wheezes, rales or rhonchi, symmetric air entry    Cardiac: normal rate, regular rhythm, normal S1, S2,   Abdomen: soft, non-tender, non-distended, normal active bowel sounds  Extremities: no pedal edema  Skin: no rash  Psych: normal mood and affect    Labs:     Lab Results   Component Value Date    WBC 6.20 01/04/2018    HGB 11.6 01/04/2018    HCT 36.0 01/04/2018    MCV 89.3 01/04/2018    PLT 266 01/04/2018     Lab Results   Component Value Date    CREAT 0.6 01/04/2018     Lab Results   Component Value Date    ALT 13 01/04/2018    AST 20 01/04/2018    ALKPHOS 57 01/04/2018    BILITOTAL 0.3 01/04/2018     No results found for: LACTATE    Microbiology:     Microbiology Results     Procedure Component Value Units Date/Time    Adult Urine culture [[id number]] Collected:  01/03/18 0940    Specimen:  Urine from Urine, Catheterized, Foley Updated:  01/03/18 0940    Narrative:       Replace urinary catheter prior to obtaining the urine culture  if it has been in place for greater than or equal to 14  days:->No  Indications for Urine Culture:->Suprapubic Pain/Tenderness or  Dysuria    Blood Culture Aerobic/Anaerobic #1 [[id number]] Collected:  01/01/18 1254    Specimen:  Arm from Blood, Intravenous Line Updated:  01/03/18 1521    Narrative:       ORDER#: [id number]                                    ORDERED BY: Baldwin Jamaica  SOURCE: Blood, Intravenous Line steripath r ac       COLLECTED:  01/01/18 12:54  ANTIBIOTICS AT COLL.:                                RECEIVED :  01/01/18 14:56  Culture Blood Aerobic and Anaerobic        PRELIM      01/03/18 15:21  01/02/18   No Growth after 1 day/s of incubation.  01/03/18   No Growth after 2 day/s of incubation.      Blood Culture Aerobic/Anaerobic #2 [[id number]] Collected:  01/01/18 1303    Specimen:  Arm from Blood, Venipuncture Updated:  01/03/18 1521    Narrative:       ORDER#: [id number]                                    ORDERED BY: Baldwin Jamaica  SOURCE: Blood, Venipuncture steriapth                COLLECTED:  01/01/18 13:03   ANTIBIOTICS AT COLL.:                                RECEIVED :  01/01/18 14:56  Culture Blood Aerobic and Anaerobic        PRELIM      01/03/18 15:21  01/02/18   No Growth after 1 day/s of incubation.  01/03/18   No Growth after 2 day/s of incubation.      Culture + Gram Stain,Aerobic, Wound [[id number]] Collected:  01/01/18 1820    Specimen:  Wound from Wound Updated:  01/03/18 0806    Narrative:       ORDER#: [id number]                                    ORDERED BY: Rachael Fee  SOURCE: Wound Deep Neck Wound #1                     COLLECTED:  01/01/18 18:20  ANTIBIOTICS AT COLL.:                                RECEIVED :  01/02/18 02:56  Stain, Gram                                FINAL       01/02/18 05:49  01/02/18   Rare WBCs             No organisms seen             No Squamous epithelial cells seen  Culture and Gram Stain, Aerobic, Wound     PRELIM      01/03/18 08:06  01/03/18   Culture no growth to date, Final report to follow      Culture + Gram Stain,Aerobic, Wound [[id number]] Collected:  01/01/18 1820    Specimen:  Wound from Wound Updated:  01/03/18 0805    Narrative:       ORDER#: [id number]                                    ORDERED BY: Rachael Fee  SOURCE: Wound Deep Neck Wound #2                     COLLECTED:  01/01/18 18:20  ANTIBIOTICS AT COLL.:                                RECEIVED :  01/02/18 03:07  Stain, Gram                                FINAL       01/02/18 05:46  01/02/18   No WBCs or organisms seen             No Squamous epithelial cells seen  Culture and Gram Stain, Aerobic, Wound     PRELIM      01/03/18 08:05  01/03/18   Culture no growth to date, Final report to follow      Culture, Anaerobic Bacteria [[id number]] Collected:  01/01/18 1820    Specimen:  Other from Wound Updated:  01/02/18 0307    Culture, Anaerobic Bacteria [[id number]] Collected:  01/01/18 1820    Specimen:  Other from Wound Updated:  01/02/18 0256    Fungal Culture & Smear [[id number]] Collected:  01/01/18 1820     Specimen:  Other from Tissue Updated:  01/02/18 1526    Narrative:       ORDER#: [id number]                                    ORDERED BY: Rachael Fee  SOURCE: Tissue Deep Neck Wound #2                    COLLECTED:  01/01/18 18:20  ANTIBIOTICS AT COLL.:                                RECEIVED :  01/02/18 03:07  Stain, Fungal                              FINAL       01/02/18 15:25  01/02/18   No Fungal or Yeast Elements Seen  Culture Fungus                             PENDING      Fungal Culture & Smear [[id number]] Collected:  01/01/18 1820    Specimen:  Other from Tissue Updated:  01/02/18 1526    Narrative:       ORDER#: [id number]                                    ORDERED BY: Rachael Fee  SOURCE: Tissue Deep Neck Wound #1                    COLLECTED:  01/01/18 18:20  ANTIBIOTICS AT COLL.:                                RECEIVED :  01/02/18 02:56  Stain, Fungal                              FINAL       01/02/18 15:25  01/02/18   No Fungal or Yeast Elements Seen  Culture Fungus                             PENDING            Rads:   No results found.    Signed by: Tonita Phoenix., MD

## 2018-01-04 NOTE — PT Progress Note (Signed)
Physical Therapy Note    [facility]   Physical Therapy Cancellation Note      Patient:  Chelsea Montoya MRN#:  [id number]  Unit:  [facility] CCW GROUND UNIT Room/Bed:  FG30/FG30.01    01/04/2018  Time: 0925      Pt not seen for physical therapy secondary to no PT needs identified.  Please reconsult if condition changes.    Duke Salvia, PT  License # [id number]  .

## 2018-01-04 NOTE — Consults (Signed)
Message left for [facility] [id number] relaying patient's need for home IV ABX.  However at this time there is no face to face in the chart.  Will continue to follow.    Grace Blight MSW  [facility] Discharge Planner  [facility]  [phone]  [email or web address]

## 2018-01-04 NOTE — OT Progress Note (Signed)
[facility]   Occupational Therapy Treatment     Patient: Chelsea Montoya    MRN#: [id number]   Unit: [facility] CCW GROUND UNIT  Bed: FG30/FG30.01      Discharge Recommendations:   Discharge Recommendation: Home with supervision, Home with outpatient OT   DME Recommended for Discharge:  (in place at home per Pt)    If Home with supervision, Home with outpatient OT recommended discharge disposition is not available, patient will need SBA-Min assist for ADL, and HHOT and progress to OP OT to address UE deficits.     Assessment:   Session focused on RUE AAROM and strengthening exercises. Pt provided with visual of bicep/deltoid and explained how innervation from C5 may affect movement. Pt requires blocking of compensatory movements to facilitate bicep and deltoid activation. Pt is progressing well and showing increased strength able to achieve <45* elbow flexion and shoulder flexion/abduction. PROM performed to shoulder elbow and Pt educated to attempt activating the RUE when performing bilateral shoulder flexion (LUE assists RUE). Pt would benefit from continued skilled OT to maximize return of UE function and increase ADL/IADL independence.     Assessment  Assessment: decreased strength;balance deficits;decreased independence with ADLs;decreased independence with IADLs  Prognosis: Good;With continued OT s/p acute discharge  Progress: Progressing toward goals  Patient left without needs and call bell within reach. RN notified of session outcome.       Treatment Activities: therexe RUE, PROM, AAROM    Educated the patient to role of occupational therapy, plan of care, goals of therapy and safety with mobility and ADLs, energy conservation techniques, pursed lip breathing.    Plan:   OT Frequency Recommended: 2-3x/wk     Goal Formulation: Patient  OT Plan  Risks/Benefits/POC Discussed with Pt/Family: With patient  Treatment Interventions: ADL retraining, Functional transfer training, UE  strengthening/ROM, Endurance training  Discharge Recommendation: Home with supervision, Home with outpatient OT  DME Recommended for Discharge:  (in place at home per Pt)  OT Frequency Recommended: 2-3x/wk  OT - Next Visit Recommendation: 01/06/18    Continue plan of care.       Precautions and Contraindications:   Precautions  Weight Bearing Status: no restrictions  Other Precautions: falls    Updated Medical Status/Imaging/Labs:  Lab Results   Component Value Date/Time    HGB 11.6 01/04/2018 03:36 AM    HCT 36.0 01/04/2018 03:36 AM    K 4.0 01/04/2018 03:36 AM    NA 137 01/04/2018 03:36 AM     Subjective:   Patient's medical condition is appropriate for Occupational Therapy intervention at this time.  Patient is agreeable to participation in the therapy session. Nursing clears patient for therapy.    Pain Assessment  Pain Assessment: No/denies pain   .      Objective:   Observation of Patient/Vital Signs:  Patient is in bed with peripheral IV in place.    Cognition/Neuro Status  Arousal/Alertness: Appropriate responses to stimuli  Attention Span: Attends to task with redirection  Orientation Level: Oriented X4  Memory: Decreased short term memory  Following Commands: Follows one step commands without difficulty;Follows multistep commands with repetition  Safety Awareness: minimal verbal instruction  Insights: Fully aware of deficits  Problem Solving: supervision  Behavior: calm;cooperative  Motor Planning: intact  Coordination: intact         Self-care and Home Management  Eating: Independent  Grooming: Stand by Assist;standing at sink;wash/dry hands  Bathing: Contact Guard Assist;sitting in shower  UB  Dressing: Stand by Assist  LB Dressing: Minimal Assist  Toileting: Stand by Assist;sitting  Functional Transfers: Stand by Assist    Therapeutic Exercises  Shoulder AROM: Flexion;Abduction;Sitting (AAROM, compensatory mm blocked)  Shoulder PROM: Flexion;Abduction;Sitting  Elbow AROM:  (AAROM, compensatory mm  blocked)  Elbow PROM: Flexion;Extension  Hand ROM: tip-to-tip pincer;lateral pincer;three jaw chuck;grasp and release (against resistive foam block)  Wrist/Hand Strengthening:  (resistive foam block)                   PMP Activity: Step 2 - Supine Exercises          Treatment Modalities  Therapeutic Exercise: Strength;Flexibility/ROM  Modifications / Education Cues: Tactile;Verbal (compensatory movement blocked )    Participation: good  Endurance: good      Patient left with call bell within reach, all needs met, SCDs in place, fall mat in place, bed alarm on, and all questions answered. RN notified of session outcome and patient response.       Goals:    Time For Goal Achievement: 3 visits  ADL Goals  Patient will groom self: Stand by Assist, at sinkside, Goal met, Supervision, New goal  Patient will dress upper body: Stand by Assist, with hemi tech, Goal met, Supervision, New goal  Mobility and Transfer Goals  Pt will transfer bed to toilet: Supervision, New goal  Neuro Re-Ed Goals  Pt will perform dynamic standing balance: Stand by Assist, for 15 minutes, to complete standing ADLs safely  Musculoskeletal Goals  Pt will increase AROM: by 25%, shoulder, elbow (45* active flexion of elbow, and flexion/abduction shoulder)  Pt will demonstrate increase of strength: to 3/5 (elbow flexion against gravity)       Dorise Bullion, OTD, OTR/L, CEAS   Pager# [phone] 01/04/2018      Time of Treatment  OT Received On: 01/04/18  Start Time: 0815  Stop Time: 0910  Time Calculation (min): 55 min

## 2018-01-04 NOTE — Consults (Signed)
Peripherally Inserted Central Catheter (PICC) PROCEDURE NOTE    Montoya, Chelsea  01/04/2018    INDICATIONS: Home care intravenous therapy    CONSENT: The procedure, risks, benefits and alternatives were discussed with the patient.  All questions were answered, and informed written consent was obtained from the patient.  Informed consent and Procedure Boarding Pass were completed.     PROCEDURE DETAILS:      Ultrasound was used to confirm patency of the Right Brachial vein prior to obtaining venous access. The area to be punctured was  cleansed with chlorhexidine 2% for more than 30 seconds and the site draped using maximal sterile barrier precautions.    After 1% lidocaine injected followed by puncture of the Right Brachial with a 21-gauge single-wall needle under direct sonographic guidance. Guidewire was advanced and the needle was removed and a peel-away sheath was placed. The catheter was then trimmed and advanced through the peel-away sheath using electronic navigation.  The sheath was removed, brisk blood return confirmed, the catheter was flushed with normal saline and capped.      Maximal sterile barrier was used: cap, mask, sterile gloves, sterile gown and body drape.  All guide wires were removed with tip intact by visual inspection.  The catheter was stabilized on the skin using a securement device.  Sterile transparent occlusive dressing with gauze under the dressing applied using aseptic technique.     Patient did tolerate procedure well.        Catheter Type: Power picc 56f single lumen  Insertion Site (vein): Right Brachial  Total Length: 40cm cm  Internal Length: 37 cm  External Length: 3 cm  UAC: 37 cm    PICC Reference #: [id number]  PICC Kit Lot #: [id number]  PICC Kit expiration date: 11/24/2018    FINDINGS/CONCLUSIONS:   No signs of bleeding or symptoms of nerve irritation noted.  Final tip location was confirmed utilizing ECG tip confirmation.  PICC is ready for use  PICC education /  instructions provided to patient.    Signed,    Delorise Jackson, RN,[place]-BC

## 2018-01-04 NOTE — Progress Notes (Addendum)
Patient information  Sent  to [facility] for benefit  Verification for Possible IV ABX. Possible [place] over the weekend as per CM.     Patient covered 100% for IV ABX. Pending F2F From ID.     If the patient requires IV ABX  On [place] and  [place] on the weekend. [facility] to deliver medications and supplies and Infusion Nurse.     Weekend  [facility] to follow up and assist with [place] needs.         Ruel Favors RN, Event organiser   Home  Health   [phone]

## 2018-01-04 NOTE — Progress Notes (Signed)
PROGRESS NOTE    Date Time: 01/04/18 8:26 AM  Patient Name: Chelsea Montoya, Chelsea Montoya      Assessment:   Post op day # 3 s/p I and Montoya of cervical wound    Plan:   1.  S/p I and Montoya  -Dual abx tx.  PICC line ordered per ID.  Appreciate the consult.  Plan for discharge once cultures final.  -Dressing changed today.  JP drain in place and with minimal output.  JP to remain until discharge.  Pt to ambulate.  Exercises given for C5 palsy.  Con't current pain regimen.  2.  Urinary retention--foley in place and culture pending  -Poss UTI covered w/ abx  -will likely Montoya/c w/ the foley in place and outpt uro appt.    Subjective:   Pt is post op day # 3 s/p I and Montoya of cervical wound.  Pt states pain is controlled on current meds.  Pt has been up and walking.  Has been doing exercises several times a day for C5 palsy.  Denies CP, Sob, N/V, HA and dizziness.    Medications:     Current Facility-Administered Medications   Medication Dose Route Frequency   . albuterol  2 puff Inhalation TID   . buPROPion XL  300 mg Oral QAM   . cefepime  2 g Intravenous Q8H   . cetirizine  10 mg Oral Daily   . dicyclomine  10 mg Oral BID   . fluticasone furoate-vilanterol  1 puff Inhalation QAM   . furosemide  10 mg Oral BID   . senna-docusate  1 tablet Oral BID   . tamsulosin  0.4 mg Oral QD after dinner   . vancomycin  1,500 mg Intravenous Q12H       Review of Systems:   A comprehensive review of systems was: Pos for neck pain    Physical Exam:     Vitals:    01/04/18 0700   BP: 142/72   Pulse: 83   Resp: 16   Temp: 97.4 F (36.3 C)   SpO2: 93%       Intake and Output Summary (Last 24 hours) at Date Time    Intake/Output Summary (Last 24 hours) at 01/04/18 0826  Last data filed at 01/04/18 0618   Gross per 24 hour   Intake             2700 ml   Output             4800 ml   Net            -2100 ml       EXAM:  WD/WN: NAD;  A&O x 4  A:  Neg  Incision:  C/Montoya/I; no swelling or erythema  JP drain:  In place and functioning  Ext:  Right arm w/ C5 palsy and 5/5 ms  C6-8; left arm w/ 5/5 ms C4-8.  Sensation intact     Labs:     Results     Procedure Component Value Units Date/Time    GFR [[id number]] Collected:  01/04/18 0336     Updated:  01/04/18 0514     EGFR >60.0    Comprehensive metabolic panel [[id number]]  (Abnormal) Collected:  01/04/18 0336    Specimen:  Blood Updated:  01/04/18 0514     Glucose 105 (H) mg/dL      BUN 4.0 (L) mg/dL      Creatinine 0.6 mg/dL      Sodium 010  mEq/L      Potassium 4.0 mEq/L      Chloride 102 mEq/L      CO2 28 mEq/L      Calcium 9.0 mg/dL      Protein, Total 6.0 g/dL      Albumin 2.7 (L) g/dL      AST (SGOT) 20 U/L      ALT 13 U/L      Alkaline Phosphatase 57 U/L      Bilirubin, Total 0.3 mg/dL      Globulin 3.3 g/dL      Albumin/Globulin Ratio 0.8 (L)    CBC without differential [[id number]]  (Abnormal) Collected:  01/04/18 0336    Specimen:  Blood from Blood Updated:  01/04/18 0447     WBC 6.20 x10 3/uL      Hgb 11.6 g/dL      Hematocrit 40.9 %      Platelets 266 x10 3/uL      RBC 4.03 x10 6/uL      MCV 89.3 fL      MCH 28.8 pg      MCHC 32.2 g/dL      RDW 12 %      MPV 8.5 (L) fL      Nucleated RBC 0.0 /100 WBC      Absolute NRBC 0.00 x10 3/uL     Blood Culture Aerobic/Anaerobic #1 [[id number]] Collected:  01/01/18 1254    Specimen:  Arm from Blood, Intravenous Line Updated:  01/03/18 1521    Narrative:       ORDER#: [id number]                                    ORDERED BY: Baldwin Jamaica  SOURCE: Blood, Intravenous Line steripath r ac       COLLECTED:  01/01/18 12:54  ANTIBIOTICS AT COLL.:                                RECEIVED :  01/01/18 14:56  Culture Blood Aerobic and Anaerobic        PRELIM      01/03/18 15:21  01/02/18   No Growth after 1 day/s of incubation.  01/03/18   No Growth after 2 day/s of incubation.      Blood Culture Aerobic/Anaerobic #2 [[id number]] Collected:  01/01/18 1303    Specimen:  Arm from Blood, Venipuncture Updated:  01/03/18 1521    Narrative:       ORDER#: [id number]                                    ORDERED  BY: Baldwin Jamaica  SOURCE: Blood, Venipuncture steriapth                COLLECTED:  01/01/18 13:03  ANTIBIOTICS AT COLL.:                                RECEIVED :  01/01/18 14:56  Culture Blood Aerobic and Anaerobic        PRELIM      01/03/18 15:21  01/02/18   No Growth after 1 day/s of incubation.  01/03/18   No Growth after 2 day/s of incubation.  Adult Urine culture [[id number]] Collected:  01/03/18 0940    Specimen:  Urine from Urine, Catheterized, Foley Updated:  01/03/18 0940    Narrative:       Replace urinary catheter prior to obtaining the urine culture  if it has been in place for greater than or equal to 14  days:->No  Indications for Urine Culture:->Suprapubic Pain/Tenderness or  Dysuria    C Reactive Protein [[id number]]  (Abnormal) Collected:  01/03/18 0426    Specimen:  Blood Updated:  01/03/18 0859     C-Reactive Protein 5.2 (H) mg/dL     Prealbumin [[id number]]  (Abnormal) Collected:  01/03/18 0426    Specimen:  Blood Updated:  01/03/18 0859     Prealbumin 11.0 (L) mg/dL     Sedimentation rate (ESR) [[id number]]  (Abnormal) Collected:  01/03/18 0426    Specimen:  Blood Updated:  01/03/18 0856     Sed Rate 50 (H) mm/Hr           Recent CBC   Recent Labs      01/04/18   0336   RBC  4.03   Hgb  11.6   Hematocrit  36.0   MCV  89.3   MCH  28.8   MCHC  32.2   RDW  12   MPV  8.5*       Rads:   Radiological Procedure reviewed.    Signed by: Marcelino Scot

## 2018-01-04 NOTE — Plan of Care (Signed)
Problem: Moderate/High Fall Risk Score >5  Goal: Patient will remain free of falls   01/03/18 2057   OTHER   Moderate Risk (6-13) MOD-Remain with patient during toileting;MOD-(VH Only) Yellow slippers;MOD-Apply bed exit alarm if patient is confused;MOD-Floor mat at bedside (where available) if appropriate       Problem: Safety  Goal: Patient will be free from injury during hospitalization  Outcome: Progressing   01/04/18 0053   Goal/Interventions addressed this shift   Patient will be free from injury during hospitalization  Assess patient's risk for falls and implement fall prevention plan of care per policy;Provide and maintain safe environment;Use appropriate transfer methods;Ensure appropriate safety devices are available at the bedside;Include patient/ family/ care giver in decisions related to safety;Hourly rounding     Goal: Patient will be free from infection during hospitalization  Outcome: Progressing   01/04/18 0053   Goal/Interventions addressed this shift   Free from Infection during hospitalization Assess and monitor for signs and symptoms of infection;Monitor lab/diagnostic results       Problem: Pain  Goal: Pain at adequate level as identified by patient  Outcome: Progressing   01/04/18 0053   Goal/Interventions addressed this shift   Pain at adequate level as identified by patient Identify patient comfort function goal;Assess for risk of opioid induced respiratory depression, including snoring/sleep apnea. Alert healthcare team of risk factors identified.;Reassess pain within 30-60 minutes of any procedure/intervention, per Pain Assessment, Intervention, Reassessment (AIR) Cycle;Assess pain on admission, during daily assessment and/or before any "as needed" intervention(s);Evaluate if patient comfort function goal is met;Offer non-pharmacological pain management interventions;Evaluate patient's satisfaction with pain management progress       Problem: Discharge Barriers  Goal: Patient will be  discharged home or other facility with appropriate resources  Outcome: Progressing   01/04/18 0053   Goal/Interventions addressed this shift   Discharge to home or other facility with appropriate resources Provide appropriate patient education;Provide information on available health resources;Initiate discharge planning       Problem: Psychosocial and Spiritual Needs  Goal: Demonstrates ability to cope with hospitalization/illness  Outcome: Progressing   01/04/18 0053   Goal/Interventions addressed this shift   Demonstrates ability to cope with hospitalizations/illness Encourage verbalization of feelings/concerns/expectations;Provide quiet environment       Problem: Anterior/Posterior Cervical Discectomy/Fusion  Goal: Free from infection  Outcome: Progressing   01/04/18 0053   Goal/Interventions addressed this shift   Free from infection  Monitor/assess vital signs;Assess for signs and symptoms of infection;Assess surgical dressing, reinforce or change as needed per order;Teach/reinforce use of incentive spirometer 10 times per hour while awake, cough and deep breath as needed;Administer and discontinue antibiotics per SCIP measures;Foley maintenance care if Foley in place. Utilize indwelling urinary catheter policy/protocol ([place]).     Goal: Hemodynamic stability  Outcome: Progressing   01/04/18 0053   Goal/Interventions addressed this shift   Hemodynamic stability Monitor/assess vital signs;Monitor intake and output. Notify LIP if urine output is less than 240 mL in 8 hours.;Monitor/assess output from surgical drain if present;Monitor/assess lab values. Notify LIP of abnormal results.     Goal: Pain at adequate level as identified by patient  Outcome: Progressing   01/04/18 0053   Goal/Interventions addressed this shift   Pain at adequate level as identified by patient Identify and evaluate patient comfort function goal;Administer analgesics as ordered to achieve patient comfort function goal     Goal: Stable  neurovascular status  Outcome: Progressing   01/04/18 0053   Goal/Interventions addressed this shift  Stable neurovascular status Monitor/assess neurovascular status (pulses, capillary refill, pain, paresthesia, presence of edema)     Goal: Effective airway  Outcome: Progressing   01/04/18 0053   Goal/Interventions addressed this shift   Effective airway  Assess respiratory rate, O2 saturation, and work of breathing;Monitor/assess output from surgical drain if present;Complete Anterior Neck Surgery assessment flowsheet;Assess surgical site for hematoma development;Assess and monitor patient's ability to speak;Monitor/assess patient's ability to swallow and manage oral secretions       Comments: Pt AOx4. Denies SOB, CP and n/v.  With anterior neck incision, dressing, CDI. Jp drain in place.  With urethral catheter in place  Complained of pain rated as 5/10, PRN Percocet given with good effect.  Tolerating diet. OOB with 1 person assist.  Comfort and safety measures provided.  Call bell within reach.  Bed locked and at lowest level.  Floor mat in place.  Will continue to monitor.

## 2018-01-04 NOTE — Progress Notes (Signed)
[facility] HOSPITALIST PROGRESS NOTE    Date Time: 01/04/18 10:12 AM  Patient Name: Chelsea Montoya, Chelsea Montoya      Assessment:   This is a 62 y.o. female w/ hx of HTN, MVP, crohn's, asthma, arthritis, seizures, anxiety, thyroid nodule, herniated nucleus pulposus and underwent a planned ACDF C3-7 w. Dr. Kizzie Bane, now here with wound infection, s/p I&D of cervical wound     New onset urinary incontinence, now has foley for retention    Plan:   Pain control resume dilaudid IV and percocet   Resume abx now  PICC line in place   Follow cx   Leave foley in for now, started on Flomax   Wound and drain mx per NSGY  dispo - when cleared  By NS and ID     Subjective:   Feels dilaudid helps better for pain   apetitie fair       Medications:     Current Facility-Administered Medications   Medication Dose Route Frequency   . albuterol  2 puff Inhalation TID   . buPROPion XL  300 mg Oral QAM   . cefepime  2 g Intravenous Q8H   . cetirizine  10 mg Oral Daily   . dicyclomine  10 mg Oral BID   . fluticasone furoate-vilanterol  1 puff Inhalation QAM   . furosemide  10 mg Oral BID   . senna-docusate  1 tablet Oral BID   . tamsulosin  0.4 mg Oral QD after dinner   . vancomycin  1,500 mg Intravenous Q12H       Review of Systems:   A comprehensive review of systems was: General ROS: negative for - chills, fatigue or fever  Respiratory ROS: no cough, shortness of breath, or wheezing  Cardiovascular ROS: no chest pain or dyspnea on exertion  Gastrointestinal ROS: no abdominal pain, change in bowel habits, or black or bloody stools  Genito-Urinary ROS: positive for - retention   Musculoskeletal ROS: positive for - neck pain and right arm weakness    Physical Exam:     Vitals:    01/04/18 0700   BP: 142/72   Pulse: 83   Resp: 16   Temp: 97.4 F (36.3 C)   SpO2: 93%       Intake and Output Summary (Last 24 hours) at Date Time    Intake/Output Summary (Last 24 hours) at 01/04/18 1012  Last data filed at 01/04/18 0102   Gross per 24 hour   Intake              2700 ml   Output             4800 ml   Net            -2100 ml       General: awake, alert, oriented x 3; drain in place   Neck: supple, no lymphadenopathy, no thyromegaly, no JVD,   Cardiovascular: regular rate and rhythm, no murmurs, rubs or gallops  Lungs: clear to auscultation bilaterally, without wheezing, rhonchi, or rales  Abdomen: soft, non-tender, non-distended; no palpable masses, no hepatosplenomegaly, normoactive bowel sounds, no rebound or guarding  Extremities: no clubbing, cyanosis, or edema  Neuro: cranial nerves grossly intact, strength 5/5 in upper and lower extremities, sensation intact,   Skin: no rashes or lesions noted, surgical dressing intact with drain      Labs:     Results     Procedure Component Value Units Date/Time    Culture + Gram  Azzie Glatter Wound [[id number]] Collected:  01/01/18 1820    Specimen:  Wound from Wound Updated:  01/04/18 0942    Narrative:       ORDER#: [id number]                                    ORDERED BY: Rachael Fee  SOURCE: Wound Deep Neck Wound #1                     COLLECTED:  01/01/18 18:20  ANTIBIOTICS AT COLL.:                                RECEIVED :  01/02/18 02:56  Stain, Gram                                FINAL       01/02/18 05:49  01/02/18   Rare WBCs             No organisms seen             No Squamous epithelial cells seen  Culture and Gram Stain, Aerobic, Wound     PRELIM      01/04/18 09:42  01/03/18   Culture no growth to date, Final report to follow  01/04/18   Culture no growth to date, Final report to follow      Culture + Gram Stain,Aerobic, Wound [[id number]] Collected:  01/01/18 1820    Specimen:  Wound from Wound Updated:  01/04/18 0941    Narrative:       ORDER#: [id number]                                    ORDERED BY: Rachael Fee  SOURCE: Wound Deep Neck Wound #2                     COLLECTED:  01/01/18 18:20  ANTIBIOTICS AT COLL.:                                RECEIVED :  01/02/18 03:07  Stain, Gram                                 FINAL       01/02/18 05:46  01/02/18   No WBCs or organisms seen             No Squamous epithelial cells seen  Culture and Gram Stain, Aerobic, Wound     PRELIM      01/04/18 09:41  01/03/18   Culture no growth to date, Final report to follow  01/04/18   Culture no growth to date, Final report to follow      Culture, Anaerobic Bacteria [[id number]] Collected:  01/01/18 1820    Specimen:  Other from Wound Updated:  01/04/18 0933    Narrative:       ORDER#: [id number]  ORDERED BY: HUGHES, STEVEN  SOURCE: Wound Deep Neck Wound #2                     COLLECTED:  01/01/18 18:20  ANTIBIOTICS AT COLL.:                                RECEIVED :  01/02/18 03:07  Culture, Anaerobic Bacteria                PRELIM      01/04/18 09:33  01/04/18   No growth to date, final report to follow      Culture, Anaerobic Bacteria [[id number]] Collected:  01/01/18 1820    Specimen:  Other from Wound Updated:  01/04/18 0933    Narrative:       ORDER#: [id number]                                    ORDERED BY: Rachael Fee  SOURCE: Wound Deep Neck Wound #1                     COLLECTED:  01/01/18 18:20  ANTIBIOTICS AT COLL.:                                RECEIVED :  01/02/18 02:56  Culture, Anaerobic Bacteria                PRELIM      01/04/18 09:33  01/04/18   No growth to date, final report to follow      GFR [[id number]] Collected:  01/04/18 0336     Updated:  01/04/18 0514     EGFR >60.0    Comprehensive metabolic panel [[id number]]  (Abnormal) Collected:  01/04/18 0336    Specimen:  Blood Updated:  01/04/18 0514     Glucose 105 (H) mg/dL      BUN 4.0 (L) mg/dL      Creatinine 0.6 mg/dL      Sodium 272 mEq/L      Potassium 4.0 mEq/L      Chloride 102 mEq/L      CO2 28 mEq/L      Calcium 9.0 mg/dL      Protein, Total 6.0 g/dL      Albumin 2.7 (L) g/dL      AST (SGOT) 20 U/L      ALT 13 U/L      Alkaline Phosphatase 57 U/L      Bilirubin, Total 0.3 mg/dL      Globulin 3.3 g/dL      Albumin/Globulin Ratio  0.8 (L)    CBC without differential [[id number]]  (Abnormal) Collected:  01/04/18 0336    Specimen:  Blood from Blood Updated:  01/04/18 0447     WBC 6.20 x10 3/uL      Hgb 11.6 g/dL      Hematocrit 74.2 %      Platelets 266 x10 3/uL      RBC 4.03 x10 6/uL      MCV 89.3 fL      MCH 28.8 pg      MCHC 32.2 g/dL      RDW 12 %      MPV 8.5 (L) fL      Nucleated RBC 0.0 /  100 WBC      Absolute NRBC 0.00 x10 3/uL     Blood Culture Aerobic/Anaerobic #1 [[id number]] Collected:  01/01/18 1254    Specimen:  Arm from Blood, Intravenous Line Updated:  01/03/18 1521    Narrative:       ORDER#: [id number]                                    ORDERED BY: Baldwin Jamaica  SOURCE: Blood, Intravenous Line steripath r ac       COLLECTED:  01/01/18 12:54  ANTIBIOTICS AT COLL.:                                RECEIVED :  01/01/18 14:56  Culture Blood Aerobic and Anaerobic        PRELIM      01/03/18 15:21  01/02/18   No Growth after 1 day/s of incubation.  01/03/18   No Growth after 2 day/s of incubation.      Blood Culture Aerobic/Anaerobic #2 [[id number]] Collected:  01/01/18 1303    Specimen:  Arm from Blood, Venipuncture Updated:  01/03/18 1521    Narrative:       ORDER#: [id number]                                    ORDERED BY: Baldwin Jamaica  SOURCE: Blood, Venipuncture steriapth                COLLECTED:  01/01/18 13:03  ANTIBIOTICS AT COLL.:                                RECEIVED :  01/01/18 14:56  Culture Blood Aerobic and Anaerobic        PRELIM      01/03/18 15:21  01/02/18   No Growth after 1 day/s of incubation.  01/03/18   No Growth after 2 day/s of incubation.                Rads:   Radiological Procedure reviewed.    Signed by: Zenovia Jarred

## 2018-01-05 LAB — COMPREHENSIVE METABOLIC PANEL
ALT: 16 U/L (ref 0–55)
AST (SGOT): 30 U/L (ref 5–34)
Albumin/Globulin Ratio: 0.7 — ABNORMAL LOW (ref 0.9–2.2)
Albumin: 2.7 g/dL — ABNORMAL LOW (ref 3.5–5.0)
Alkaline Phosphatase: 63 U/L (ref 37–106)
BUN: 6 mg/dL — ABNORMAL LOW (ref 7.0–19.0)
Bilirubin, Total: 0.4 mg/dL (ref 0.2–1.2)
CO2: 28 mEq/L (ref 22–29)
Calcium: 9.1 mg/dL (ref 8.5–10.5)
Chloride: 102 mEq/L (ref 100–111)
Creatinine: 0.6 mg/dL (ref 0.6–1.0)
Globulin: 3.7 g/dL — ABNORMAL HIGH (ref 2.0–3.6)
Glucose: 130 mg/dL — ABNORMAL HIGH (ref 70–100)
Potassium: 4.1 mEq/L (ref 3.5–5.1)
Protein, Total: 6.4 g/dL (ref 6.0–8.3)
Sodium: 138 mEq/L (ref 136–145)

## 2018-01-05 LAB — CBC WITH MANUAL DIFFERENTIAL
Absolute NRBC: 0 10*3/uL (ref 0.00–0.00)
Band Neutrophils Absolute: 0.06 10*3/uL (ref 0.00–1.00)
Band Neutrophils: 1 %
Basophils Absolute Manual: 0.06 10*3/uL (ref 0.00–0.08)
Basophils Manual: 1 %
Cell Morphology: NORMAL
Eosinophils Absolute Manual: 0.18 10*3/uL (ref 0.00–0.44)
Eosinophils Manual: 3 %
Hematocrit: 37.8 % (ref 34.7–43.7)
Hgb: 12.2 g/dL (ref 11.4–14.8)
Lymphocytes Absolute Manual: 1.64 10*3/uL (ref 0.42–3.22)
Lymphocytes Manual: 27 %
MCH: 28.5 pg (ref 25.1–33.5)
MCHC: 32.3 g/dL (ref 31.5–35.8)
MCV: 88.3 fL (ref 78.0–96.0)
MPV: 8.3 fL — ABNORMAL LOW (ref 8.9–12.5)
Monocytes Absolute: 0.24 10*3/uL (ref 0.21–0.85)
Monocytes Manual: 4 %
Myelocytes Absolute: 0.06 10*3/uL — ABNORMAL HIGH
Myelocytes: 1 %
Neutrophils Absolute Manual: 3.83 10*3/uL (ref 1.10–6.33)
Nucleated RBC: 0 /100 WBC (ref 0.0–0.0)
Platelet Estimate: NORMAL
Platelets: 296 10*3/uL (ref 142–346)
RBC: 4.28 10*6/uL (ref 3.90–5.10)
RDW: 13 % (ref 11–15)
Segmented Neutrophils: 63 %
WBC: 6.08 10*3/uL (ref 3.10–9.50)

## 2018-01-05 LAB — GFR: EGFR: >60

## 2018-01-05 LAB — SEDIMENTATION RATE: Sed Rate: 61 mm/Hr — ABNORMAL HIGH (ref 0–20)

## 2018-01-05 LAB — CK: Creatine Kinase (CK): 35 U/L (ref 29–168)

## 2018-01-05 LAB — C-REACTIVE PROTEIN: C-Reactive Protein: 1.9 mg/dL — ABNORMAL HIGH (ref 0.0–0.8)

## 2018-01-05 MED ORDER — NALOXONE HCL 4 MG/0.1ML NA LIQD
NASAL | 0 refills | Status: DC
Start: 2018-01-05 — End: 2018-04-15

## 2018-01-05 MED ORDER — DIAZEPAM 5 MG PO TABS
5.0000 mg | ORAL_TABLET | Freq: Three times a day (TID) | ORAL | 0 refills | Status: DC | PRN
Start: 2018-01-05 — End: 2018-04-15

## 2018-01-05 MED ORDER — SODIUM CHLORIDE 0.9 % IV SOLN
5.00 mg/kg | INTRAVENOUS | Status: DC
Start: 2018-01-05 — End: 2018-04-15

## 2018-01-05 MED ORDER — SODIUM CHLORIDE 0.9 % IV MBP
2.00 g | Freq: Three times a day (TID) | INTRAVENOUS | Status: DC
Start: 2018-01-05 — End: 2018-04-15

## 2018-01-05 MED ORDER — TAMSULOSIN HCL 0.4 MG PO CAPS
0.40 mg | ORAL_CAPSULE | Freq: Every day | ORAL | 0 refills | Status: DC
Start: 2018-01-05 — End: 2018-04-15

## 2018-01-05 MED ORDER — ALBUTEROL SULFATE HFA 108 (90 BASE) MCG/ACT IN AERS
2.0000 | INHALATION_SPRAY | Freq: Four times a day (QID) | RESPIRATORY_TRACT | Status: DC | PRN
Start: 2018-01-05 — End: 2018-01-05

## 2018-01-05 MED ORDER — OXYCODONE-ACETAMINOPHEN 5-325 MG PO TABS
2.0000 | ORAL_TABLET | ORAL | 0 refills | Status: AC | PRN
Start: 2018-01-05 — End: 2018-01-12

## 2018-01-05 NOTE — Progress Notes (Signed)
01/05/18 1020   Discharge Disposition   Patient preference/choice provided? Yes   Physical Discharge Disposition Home, Home Health   Name of Referred Infusion Company [facility]   Name of Infusion Company Placement [facility]   Mode of Designer, industrial/product nurse notified of transport plan? Yes   Special requirements for patient during transport: Other (comment)  (none)   Outpatient Services   Home Health Skilled Nursing   CM Interventions   Follow up appointment scheduled? No   Reason no follow up scheduled? Other (comment)  (patient to schedule)   Referral made for home health RN visit? No, Other (comment)   Multidisciplinary rounds/family meeting before d/c? Yes   Medicare Checklist   Is this a Medicare patient? No     Patient to discharge home today with IV abx through [facility].  Family to transport.    Canaan Holzer [facility] SW/CM  [facility] Case Management Department  [phone]

## 2018-01-05 NOTE — Discharge Summary -  Nursing (Signed)
Patient discharged home in stable condition. Foley and leg bag teaching done. Discharge instructions and prescriptions given. IV removed. PICC dressing changed. Patient has all belongings.

## 2018-01-05 NOTE — Progress Notes (Signed)
Acute Problem List:   Fever  Neck pain  -- 01/01/18 Cervical CT - progressive edema/fluid involving the operative bed involving the cervical soft tissues on the left, with progressive prevertebral soft tissue swelling diffusely  -- blood cx pending     Herniated nucleus pulposus  -- s/p 12/17/17 - Anterior/Posterior Cervical Discectomy/Fusion  --S/P I&D Cervical Wound I&D; purulence encountered        Chronic Conditions:  Crohn's  Mitral valve prolapse  Anxiety   Asthma       Antimicrobials:   #5 Cefepime 2g IV q8H  #5 (Vancomycin--->#2 Daptomycin 500 mg IV Q24H)    R PICC placed 01/04/18    No fever  WBC~6K  1% Band forms  Cr=0.6  CPK=35 baseline for Daptomycin  ESR=50  CRP=1.9  Prealbumin=11  UA negative  Wound, Blood, and Urine Cultures negative    OK for D/C from ID perspective with office F/U

## 2018-01-05 NOTE — Hospital Course (Signed)
The patient presented for and I and D of a cervical wound after several days of fever and pain.  Operative course was unremarkable.  The patient's antibiotics were managed by Infectious Disease.  The patient's pain, fever and white blood cell count decreased with antibiotic management.  She was discharged to home under her family's supervision with a likely 6 wk course of antibiotic management.

## 2018-01-05 NOTE — Plan of Care (Signed)
Problem: Moderate/High Fall Risk Score >5  Goal: Patient will remain free of falls  Outcome: Progressing   01/04/18 2050   OTHER   Moderate Risk (6-13) MOD-Initiate Yellow "Fall Risk" magnet communication tool;MOD-Apply bed exit alarm if patient is confused;MOD-Floor mat at bedside (where available) if appropriate;MOD-Remain with patient during toileting;MOD-include family in multidisciplinary POC discussions;MOD-Place Fall Risk level on whiteboard in room   High (Greater than 13) MOD-Initiate Yellow "Fall Risk" magnet communication tool;MOD-Include family in multidisciplinary POC discussions;MOD-Place Fall Risk level on whiteboard in room;MOD-Use of assistive devices -Bedside Commode if appropriate;MOD-Consider a move closer to Nurses Station;MOD-Remain with patient during toileting       Problem: Safety  Goal: Patient will be free from injury during hospitalization  Outcome: Progressing   01/05/18 0751   Goal/Interventions addressed this shift   Patient will be free from injury during hospitalization  Assess patient's risk for falls and implement fall prevention plan of care per policy;Provide and maintain safe environment;Ensure appropriate safety devices are available at the bedside;Include patient/ family/ care giver in decisions related to safety       Problem: Anterior/Posterior Cervical Discectomy/Fusion  Goal: Free from infection  Outcome: Progressing   01/05/18 0751   Goal/Interventions addressed this shift   Free from infection  Monitor/assess vital signs;Assess surgical dressing, reinforce or change as needed per order;Assess for signs and symptoms of infection     Goal: Hemodynamic stability  Outcome: Progressing   01/05/18 0751   Goal/Interventions addressed this shift   Hemodynamic stability Monitor/assess vital signs;Monitor/assess output from surgical drain if present     Goal: Mobility/activity is maintained at optimum level for patient  Outcome: Progressing   01/05/18 0751   Goal/Interventions  addressed this shift   Mobility/activity is maintained at optimum level Log roll while in bed;Instruct and apply cervical collar as needed and if ordered;Keep HOB 30 degrees, unless contraindicated     Goal: Stable neurovascular status  Outcome: Progressing      Comments: Pt alert and oriented x4. Complaining of incisional neck pain, PRN pain meds given with relief. Anterior neck dressing C,D,I 1 JP draining serosanguinous. Denies numbness or tingling. Aspen collar OOB. Fright PICC next change dressing 7/13. Foley output dark yellow, CHG and foley care provided. Encouraged to use IS. Fall mat and bed alarm in placed. WCTM

## 2018-01-05 NOTE — Progress Notes (Signed)
Albuterol MDI to q6h prn as taken at home per respiratory driven protocol.

## 2018-01-05 NOTE — Discharge Summary (Signed)
DISCHARGE NOTE    Date Time: 01/05/18 10:16 AM  Patient Name: Chelsea Montoya  Attending Physician: Alonna Buckler, MD    Date of Admission:   01/01/2018    Date of Discharge:   01/05/18    Reason for Admission:   Fever postop [R50.82]  S/P cervical discectomy [Z98.890]  Surgical site infection [T81.49XA]  Postoperative fever [R50.82]    Problems:   Lists the present on admission hospital problems  Present on Admission:  . Postoperative fever      Hospital Problems:  Principal Problem:    S/P cervical discectomy  Active Problems:    Postoperative fever    Wound infection after surgery    Urinary incontinence    Leukocytosis    Fever    Urinary retention      Problem Lists:  Patient Active Problem List   Diagnosis   . Crohn's colitis   . History of colon resection   . Mitral valve prolapse   . Pyelonephritis   . Kidney stone   . Arrhythmia   . PGM breast cancer age 38   . paternal aunts x 2 ovarian cancer   . Prolapse of anterior vaginal wall   . HNP (herniated nucleus pulposus) with myelopathy, cervical   . S/P cervical discectomy   . Postoperative fever   . Wound infection after surgery   . Urinary incontinence   . Leukocytosis   . Fever   . Urinary retention       Active Multi-disciplinary problems  Active Multi-Disciplinary problems:   Moderate/High Fall Risk Score >5 (01/01/18)  Assessment/Plan    Safety (01/01/18)  Assessment/Plan    Pain (01/01/18)  Assessment/Plan    Discharge Barriers (01/01/18)  Assessment/Plan    Psychosocial and Spiritual Needs (01/01/18)  Assessment/Plan    Anterior/Posterior Cervical Discectomy/Fusion (01/02/18)  Assessment/Plan              Discharge Dx:   Cervical wound infection    Consultations:   Neuro surgery  Medicine team    Procedures performed:   I and D of cervical wound    Hospital Course:   The patient presented for and I and D of a cervical wound after several days of fever and pain.  Operative course was unremarkable.  The patient's antibiotics were managed by Infectious  Disease.  The patient's pain, fever and white blood cell count decreased with antibiotic management.  She was discharged to home under her family's supervision with a likely 6 wk course of antibiotic management.       Discharge Medications:     Current Discharge Medication List      START taking these medications    Details   cefepime 2 g in sodium chloride 0.9 % 100 mL IVPB mini-bag plus Infuse 2 g into the vein every 8 (eight) hours      DAPTOmycin 500 mg in sodium chloride 0.9 % 100 mL IVPB Infuse 500 mg into the vein every 24 hours      diazePAM (VALIUM) 5 MG tablet Take 1 tablet (5 mg total) by mouth every 8 (eight) hours as needed for Anxiety  Qty: 40 tablet, Refills: 0      naloxone (NARCAN) 4 MG/0.1ML nasal spray 1 spray intranasally. If pt does not respond or relapses into respiratory depression call 911. Give additional doses every 2-3 min.  Qty: 2 each, Refills: 0      !! oxyCODONE-acetaminophen (PERCOCET) 5-325 MG per tablet Take 2 tablets by mouth  every 4 (four) hours as needed for Pain  Qty: 40 tablet, Refills: 0      tamsulosin (FLOMAX) 0.4 MG Cap Take 1 capsule (0.4 mg total) by mouth Daily after dinner  Qty: 30 capsule, Refills: 0       !! - Potential duplicate medications found. Please discuss with provider.      CONTINUE these medications which have NOT CHANGED    Details   albuterol (PROAIR HFA) 108 (90 Base) MCG/ACT inhaler USE 1-2 PUFFS EVERY 4 HOURS AS NEEDED      dicyclomine (BENTYL) 20 MG tablet TAKE 1/2 TABLET BY MOUTH THREE TIMES A DAY AS NEEDED (10mg )  Refills: 3      fluticasone-salmeterol (ADVAIR DISKUS) 250-50 MCG/DOSE Aerosol Powder, Breath Activtivatede as needed      Loratadine-Pseudoephedrine (CLARITIN-D 12 HOUR PO) Take by mouth as needed          Omeprazole (PRILOSEC PO) Take by mouth Dose unknown      !! oxyCODONE-acetaminophen (PERCOCET) 5-325 MG per tablet Take by mouth.       !! - Potential duplicate medications found. Please discuss with provider.      STOP taking these  medications       WELLBUTRIN XL 300 MG 24 hr tablet        Furosemide (LASIX PO)                Discharge Instructions:   No dressing change or new bandage needed. You may shower, no bathing or pools. No NSAIDs (ibuprofen, advil, motrin, aleve, diclofenac) if you had a spinal fusion. Regular diet. Take over the counter stool softener daily while on pain medicines. Call office with problems [phone].    Signed by: Marcelino Scot

## 2018-01-05 NOTE — Progress Notes (Addendum)
Patient to discharge home today pending start of care for IV abx.  Referral sent to [facility] yesterday 7/12.  Message left for [facility] Nurse [phone]) requesting return call to determine status.  Will continue to follow.    ADDENDUM: 10:17 AM -  CM spoke to [facility] [phone]) to discuss set-up of IV abx as patient has a discharge order.  Felicia stated patient's IV home infusion has been set-up.  OK to discharge home today.      Alizia Greif [facility] SW/CM  [facility] Case Management Department  [phone]

## 2018-01-05 NOTE — Progress Notes (Signed)
PROGRESS NOTE    Date Time: 01/05/18 9:51 AM  Patient Name: Chelsea Montoya, Chelsea Montoya      Assessment:   Post op day # 4 s/p I and Montoya of cervical wound    Plan:   1.  S/P I and Montoya  -Pt's pain is controlled on oral meds.  Drain w/ minimal output and discontinued.  Pt ambulating well.  She will be discharged today under her family's supervision as long as home health for abx tx has been coordinated.  I have spoken directly to case mgt who are currently working on the referral.  Insurance has approved abx tx 100%.   I will con't to monitor and update Dr. Marshell Garfinkel and Dr. Kizzie Bane.      Subjective:   Pt is post op day # 4 s/p I and Montoya of cervical wound.  Pt states pain is controlled.  Has been up and walking.  Pt has been doing exercises for C5 palsy.  Whether home abx/home health referral is complete enough for discharge is still in question.      Medications:     Current Facility-Administered Medications   Medication Dose Route Frequency   . albuterol  2 puff Inhalation TID   . buPROPion XL  300 mg Oral QAM   . cefepime  2 g Intravenous Q8H   . cetirizine  10 mg Oral Daily   . DAPTOmycin (CUBICIN) IVPB  5 mg/kg Intravenous Q24H   . dicyclomine  10 mg Oral BID   . fluticasone furoate-vilanterol  1 puff Inhalation QAM   . furosemide  10 mg Oral BID   . senna-docusate  1 tablet Oral BID   . tamsulosin  0.4 mg Oral QD after dinner       Review of Systems:   A comprehensive review of systems was: Pos for neck pain    Physical Exam:     Vitals:    01/05/18 0840   BP: 130/61   Pulse: 83   Resp: 16   Temp: 97.6 F (36.4 C)   SpO2: 94%       Intake and Output Summary (Last 24 hours) at Date Time    Intake/Output Summary (Last 24 hours) at 01/05/18 0951  Last data filed at 01/05/18 0414   Gross per 24 hour   Intake                0 ml   Output             2295 ml   Net            -2295 ml       EXAM:  WD/WN: NAD;  A&O x 4  A:  Neg  Incision:  C/Montoya/I--no swelling or erythema surrounding the incision site.    Arms:  No change  neurologically    Labs:     Results     Procedure Component Value Units Date/Time    Sedimentation rate (ESR) [[id number]]  (Abnormal) Collected:  01/05/18 0330    Specimen:  Blood Updated:  01/05/18 0618     Sed Rate 61 (H) mm/Hr     CBC WITH MANUAL DIFFERENTIAL [[id number]]  (Abnormal) Collected:  01/05/18 0330     Updated:  01/05/18 0556     WBC 6.08 x10 3/uL      Hgb 12.2 g/dL      Hematocrit 78.2 %      Platelets 296 x10 3/uL      RBC 4.28 x10  6/uL      MCV 88.3 fL      MCH 28.5 pg      MCHC 32.3 g/dL      RDW 13 %      MPV 8.3 (L) fL      Nucleated RBC 0.0 /100 WBC      Absolute NRBC 0.00 x10 3/uL      Segmented Neutrophils 63 %      Band Neutrophils 1 %      Lymphocytes Manual 27 %      Monocytes Manual 4 %      Eosinophils Manual 3 %      Basophils Manual 1 %      Myelocytes 1 %      Abs Seg Manual 3.83 x10 3/uL      Bands Absolute 0.06 x10 3/uL      Absolute Lymph Manual 1.64 x10 3/uL      Monocytes Absolute 0.24 x10 3/uL      Absolute Eos Manual 0.18 x10 3/uL      Absolute Baso Manual 0.06 x10 3/uL      Absolute Myelocyte 0.06 (H) x10 3/uL      Cell Morphology: Normal     Platelet Estimate Normal     Giant Platelets Present (A)    Comprehensive metabolic panel [[id number]]  (Abnormal) Collected:  01/05/18 0330    Specimen:  Blood Updated:  01/05/18 0415     Glucose 130 (H) mg/dL      BUN 6.0 (L) mg/dL      Creatinine 0.6 mg/dL      Calcium 9.1 mg/dL      Sodium 409 mEq/L      Potassium 4.1 mEq/L      Chloride 102 mEq/L      CO2 28 mEq/L      Protein, Total 6.4 g/dL      Albumin 2.7 (L) g/dL      AST (SGOT) 30 U/L      ALT 16 U/L      Alkaline Phosphatase 63 U/L      Bilirubin, Total 0.4 mg/dL      Globulin 3.7 (H) g/dL      Albumin/Globulin Ratio 0.7 (L)    CREATINE KINASE LEVEL (CK) [[id number]] Collected:  01/05/18 0330    Specimen:  Blood Updated:  01/05/18 0415     Creatine Kinase (CK) 35 U/L     C Reactive Protein [[id number]]  (Abnormal) Collected:  01/05/18 0330    Specimen:  Blood Updated:  01/05/18  0415     C-Reactive Protein 1.9 (H) mg/dL     GFR [[id number]] Collected:  01/05/18 0330     Updated:  01/05/18 0415     EGFR >60.0    Blood Culture Aerobic/Anaerobic #2 [[id number]] Collected:  01/01/18 1303    Specimen:  Arm from Blood, Venipuncture Updated:  01/04/18 1521    Narrative:       ORDER#: [id number]                                    ORDERED BY: Baldwin Jamaica  SOURCE: Blood, Venipuncture steriapth                COLLECTED:  01/01/18 13:03  ANTIBIOTICS AT COLL.:  RECEIVED :  01/01/18 14:56  Culture Blood Aerobic and Anaerobic        PRELIM      01/04/18 15:21  01/02/18   No Growth after 1 day/s of incubation.  01/03/18   No Growth after 2 day/s of incubation.  01/04/18   No Growth after 3 day/s of incubation.      Blood Culture Aerobic/Anaerobic #1 [[id number]] Collected:  01/01/18 1254    Specimen:  Arm from Blood, Intravenous Line Updated:  01/04/18 1521    Narrative:       ORDER#: [id number]                                    ORDERED BY: Baldwin Jamaica  SOURCE: Blood, Intravenous Line steripath r ac       COLLECTED:  01/01/18 12:54  ANTIBIOTICS AT COLL.:                                RECEIVED :  01/01/18 14:56  Culture Blood Aerobic and Anaerobic        PRELIM      01/04/18 15:21  01/02/18   No Growth after 1 day/s of incubation.  01/03/18   No Growth after 2 day/s of incubation.  01/04/18   No Growth after 3 day/s of incubation.      Adult Urine culture [[id number]] Collected:  01/03/18 0940    Specimen:  Urine from Urine, Catheterized, Foley Updated:  01/04/18 1046    Narrative:       Replace urinary catheter prior to obtaining the urine culture  if it has been in place for greater than or equal to 14  days:->No  Indications for Urine Culture:->Suprapubic Pain/Tenderness or  Dysuria          Recent CBC   Recent Labs      01/05/18   0330   RBC  4.28   Hgb  12.2   Hematocrit  37.8   MCV  88.3   MCH  28.5   MCHC  32.3   RDW  13   MPV  8.3*       Rads:   Radiological  Procedure reviewed.    Signed by: Marcelino Scot

## 2018-01-05 NOTE — Progress Notes (Addendum)
Home Health Referral          Referral from Amy (Case Manager) for home health care upon discharge.    By [employer], the patient has the right to freely choose a home care provider.  Arrangements have been made with:     A company of the patients choosing. We have supplied the patient with a listing of providers in your area who asked to be included and participate in Medicare.   [facility], a home care agency that provides both adult home care services which is a wholly owned and operated by [employer] and participates in [employer]   The preferred provider of your insurance company. Choosing a home care provider other than your insurance company's preferred provider may affect your insurance coverage.    The Home Health Care Referral Form acknowledging the voluntary selection of the home care company has been completed, signed, and is on file.    Home Health Discharge Information     Your doctor has ordered SN and IV ANTIBIOTIC in-home service(s) for you while you recuperate at home, to assist you in the transition from hospital to home.    The agency that you or your representative chose to provide the service:    [facility] - [phone]    [name] has confirmed acceptance of this case today    The above services were set up by:    Fara Chute, RN BSN - [phone]    Delorise Jackson, RN Registered Nurse Signed Vascular Access  Consults   Date of Service: 01/04/2018 6:55 PM Creation Time: 01/04/2018 6:55 PM   Consult Orders:   Consult Vascular Access Team to Place Line [[id number]] ordered by Tonita Phoenix., MD at 01/04/18 [phone]         [] Hide copied text  Peripherally Inserted Central Catheter (PICC) PROCEDURE NOTE    Chelsea Montoya, Chelsea Montoya  01/04/2018    INDICATIONS: Home care intravenous therapy    CONSENT: The procedure, risks, benefits and alternatives were discussed with the patient.  All questions were answered, and informed written consent was obtained from the  patient.  Informed consent and Procedure Boarding Pass were completed.     PROCEDURE DETAILS:      Ultrasound was used to confirm patency of the Right Brachial vein prior to obtaining venous access. The area to be punctured was  cleansed with chlorhexidine 2% for more than 30 seconds and the site draped using maximal sterile barrier precautions.    After 1% lidocaine injected followed by puncture of the Right Brachial with a 21-gauge single-wall needle under direct sonographic guidance. Guidewire was advanced and the needle was removed and a peel-away sheath was placed. The catheter was then trimmed and advanced through the peel-away sheath using electronic navigation.  The sheath was removed, brisk blood return confirmed, the catheter was flushed with normal saline and capped.      Maximal sterile barrier was used: cap, mask, sterile gloves, sterile gown and body drape.  All guide wires were removed with tip intact by visual inspection.  The catheter was stabilized on the skin using a securement device.  Sterile transparent occlusive dressing with gauze under the dressing applied using aseptic technique.     Patient did tolerate procedure well.        Catheter Type: Power picc 51f single lumen  Insertion Site (vein): Right Brachial  Total Length: 40cm cm  Internal Length: 37 cm  External Length: 3 cm  UAC: 37 cm    PICC Reference #: [id number]  PICC Kit Lot #: [id number]  PICC Kit expiration date: 11/24/2018    FINDINGS/CONCLUSIONS:   No signs of bleeding or symptoms of nerve irritation noted.  Final tip location was confirmed utilizing ECG tip confirmation.  PICC is ready for use  PICC education / instructions provided to patient.    Signed,    Delorise Jackson, RN,[place]-BC            Home Health face-to-face (FTF) Encounter (Order [id number])   Consult   Date: 01/04/2018 Department: [facility] Ordering/Authorizing: Tonita Phoenix., MD   Order Information     Order Date/Time  Release Date/Time Start Date/Time End Date/Time   01/04/18 04:26 PM None 01/04/18 04:23 PM 01/04/18 04:23 PM   Order Details     Frequency Duration Priority Order Class   Once 1 occurrence Routine Hospital Performed   Standing Order Information     Remaining Occurrences Interval Last Released      0/1 Once 01/04/2018            Provider Information     Ordering User Ordering Provider Authorizing Provider   Tonita Phoenix., MD Tonita Phoenix., MD Tonita Phoenix., MD   Attending Provider(s) Admitting Provider PCP   Rudi Rummage, MD; Kizzie Ide, MD; Alonna Buckler, MD Kizzie Ide, MD Dante Gang, MD   Comments     1. Daptomycin 500 mg IV Q24H through 02/22/18   2. Cefepime 2 gm IV Q12H through 02/22/18  3. CBC, manual diff, Chem7, ESR, CRP Q weekly while on outpatient IV ABX and fax results to our office [phone])  4. Must be seen in our office Q weekly while on outpatient IV antibiotics         Order Questions     Question Answer Comment   Date of face-to-face (FTF) encounter: 01/04/2018    Medical conditions that necessitate Home Health care: K. Central line/IV infusion requiring monitoring and management    Clinical findings that support the need for Skilled Nursing. SN will: E. Educate on IV antibiotic administration and monitor response to treatment    Clinical findings that support the need for Physical Therapy. PT will K. N/A    Clinical findings that support the need for SLP. ST will F. N/A    Per clinical findings, following services are medically necessary: Skilled Nursing    Evidence this patient is homebound because: C. Decreased endurance, strength, ROM, cadence, safety/judgment during mobility    IVs IV Antibiotics          Process Instructions     Please select Home Care Services medically necessary.     Based on the above findings, I certify that this patient is confined to the home and needs intermittent skilled nursing care, physical therapry and  / or speech therapy or continues to need occupational therapy. The patient is under my care, and I have initiated the establishment of the plan of care. This patient will be followed by a physician who will periodically review the plan of care.         Collection Information     Consult Order Info     ID Description Priority Start Date Start Time   [id number] Home Health face-to-face (FTF) Encounter Routine 01/04/2018 4:23 PM   Provider Specialty Referred to   ______________________________________ _____________________________________   Acknowledgement Info  For At Acknowledged By Acknowledged On   Placing Order 01/04/18 1626 Guy Franco, RN 01/04/18 1638   Patient Information     Patient Name  Chelsea Montoya, Chelsea Montoya Sex  Female DOB  April 10, 1956   Additional Information     Associated Reports External References   Priority and Order Details [employer]     Tonita Phoenix., MD Physician Signed Infectious Disease  Progress Notes   Date of Service: 01/05/2018 10:12 AM Creation Time: 01/05/2018 10:12 AM         [] Hide copied text  Acute Problem List:   Fever  Neck pain  -- 01/01/18 Cervical CT - progressive edema/fluid involving the operative bed involving the cervical soft tissues on the left, with progressive prevertebral soft tissue swelling diffusely  -- blood cx pending     Herniated nucleus pulposus  -- s/p 12/17/17 - Anterior/Posterior Cervical Discectomy/Fusion  --S/P I&Montoya Cervical Wound I&Montoya; purulence encountered        Chronic Conditions:  Crohn's  Mitral valve prolapse  Anxiety   Asthma       Antimicrobials:   #5Cefepime 2g IV q8H  #5(Vancomycin--->#2 Daptomycin 500 mg IV Q24H)    R PICC placed 01/04/18    No fever  WBC~6K  1% Band forms  Cr=0.6  CPK=35 baseline for Daptomycin  ESR=50  CRP=1.9  Prealbumin=11  UA negative  Wound, Blood, and Urine Cultures negative    OK for Montoya/C from ID perspective with office F/U            Marcelino Scot, PA Physician Assistant Cosign  Needed Orthopedics  Discharge Summaries   Date of Service: 01/05/2018 10:16 AM Creation Time: 01/05/2018 10:16 AM         [] Hide copied text  DISCHARGE NOTE    Date Time: 01/05/18 10:16 AM  Patient Name: Chelsea Montoya  Attending Physician: Alonna Buckler, MD    Date of Admission:   01/01/2018    Date of Discharge:   01/05/18    Reason for Admission:   Fever postop [R50.82]  S/P cervical discectomy [Z98.890]  Surgical site infection [T81.49XA]  Postoperative fever [R50.82]    Problems:   Lists the present on admission hospital problems  Present on Admission:  . Postoperative fever      Hospital Problems:  Principal Problem:    S/P cervical discectomy  Active Problems:    Postoperative fever    Wound infection after surgery    Urinary incontinence    Leukocytosis    Fever    Urinary retention      Problem Lists:      Patient Active Problem List   Diagnosis   . Crohn's colitis   . History of colon resection   . Mitral valve prolapse   . Pyelonephritis   . Kidney stone   . Arrhythmia   . PGM breast cancer age 70   . paternal aunts x 2 ovarian cancer   . Prolapse of anterior vaginal wall   . HNP (herniated nucleus pulposus) with myelopathy, cervical   . S/P cervical discectomy   . Postoperative fever   . Wound infection after surgery   . Urinary incontinence   . Leukocytosis   . Fever   . Urinary retention       Active Multi-disciplinary problems  Active Multi-Disciplinary problems:   Moderate/High Fall Risk Score >5 (01/01/18)  Assessment/Plan    Safety (01/01/18)  Assessment/Plan    Pain (01/01/18)  Assessment/Plan    Discharge  Barriers (01/01/18)  Assessment/Plan    Psychosocial and Spiritual Needs (01/01/18)  Assessment/Plan    Anterior/Posterior Cervical Discectomy/Fusion (01/02/18)  Assessment/Plan              Discharge Dx:   Cervical wound infection    Consultations:   Neuro surgery  Medicine team    Procedures performed:   I and Montoya of cervical wound    Hospital Course:   The patient presented for and I  and Montoya of a cervical wound after several days of fever and pain.  Operative course was unremarkable.  The patient's antibiotics were managed by Infectious Disease.  The patient's pain, fever and white blood cell count decreased with antibiotic management.  She was discharged to home under her family's supervision with a likely 6 wk course of antibiotic management.       Discharge Medications:          Current Discharge Medication List           START taking these medications    Details   cefepime 2 g in sodium chloride 0.9 % 100 mL IVPB mini-bag plus Infuse 2 g into the vein every 8 (eight) hours      DAPTOmycin 500 mg in sodium chloride 0.9 % 100 mL IVPB Infuse 500 mg into the vein every 24 hours      diazePAM (VALIUM) 5 MG tablet Take 1 tablet (5 mg total) by mouth every 8 (eight) hours as needed for Anxiety  Qty: 40 tablet, Refills: 0      naloxone (NARCAN) 4 MG/0.1ML nasal spray 1 spray intranasally. If pt does not respond or relapses into respiratory depression call 911. Give additional doses every 2-3 min.  Qty: 2 each, Refills: 0      !! oxyCODONE-acetaminophen (PERCOCET) 5-325 MG per tablet Take 2 tablets by mouth every 4 (four) hours as needed for Pain  Qty: 40 tablet, Refills: 0      tamsulosin (FLOMAX) 0.4 MG Cap Take 1 capsule (0.4 mg total) by mouth Daily after dinner  Qty: 30 capsule, Refills: 0       !! - Potential duplicate medications found. Please discuss with provider.           CONTINUE these medications which have NOT CHANGED    Details   albuterol (PROAIR HFA) 108 (90 Base) MCG/ACT inhaler USE 1-2 PUFFS EVERY 4 HOURS AS NEEDED      dicyclomine (BENTYL) 20 MG tablet TAKE 1/2 TABLET BY MOUTH THREE TIMES A DAY AS NEEDED (10mg )  Refills: 3      fluticasone-salmeterol (ADVAIR DISKUS) 250-50 MCG/DOSE Aerosol Powder, Breath Activtivatede as needed      Loratadine-Pseudoephedrine (CLARITIN-Montoya 12 HOUR PO) Take by mouth as needed          Omeprazole (PRILOSEC PO) Take by mouth Dose unknown       !! oxyCODONE-acetaminophen (PERCOCET) 5-325 MG per tablet Take by mouth.       !! - Potential duplicate medications found. Please discuss with provider.          STOP taking these medications       WELLBUTRIN XL 300 MG 24 hr tablet        Furosemide (LASIX PO)                Discharge Instructions:   No dressing change or new bandage needed. You may shower, no bathing or pools. No NSAIDs (ibuprofen, advil, motrin, aleve, diclofenac) if you had a spinal fusion.  Regular diet. Take over the counter stool softener daily while on pain medicines. Call office with problems [phone].    Signed by: Marcelino Scot         [facility]  [facility]        Patient Name: Chelsea Montoya, Chelsea Montoya     MRN: [id number]     CSN: [id number]       Account Information    Hosp Acct #   [id number] Patient Class   Inpatient Service  Spine Accommodation Code  Semi-Private     Admission Information    Admitting Physician:  Attending Physician: Kizzie Ide, MD   Unit  CCW GROUND FLOOR L&Montoya Status     Admitting Diagnosis: Fever postop; S/P cervical discectomy; Surgical site infection; Postoperat* Room / Bed  FG30/FG30.01 L&Montoya - Last Menstrual Cycle     Chief Complaint: Post-op Problem     Admit Type:  Admit Date/Time:  Discharge Date/Time: Emergency  01/01/2018 / 1207  01/05/2018 / 1648 Length of Stay: 4 Days   L&D EDD   Estimated Date of Delivery: None noted.     Patient Information            Home Address: [address]  [place] [address] Employer:  Employer Address:     ,     Main Phone: [phone] Employer Phone:    SSN: [id number]     DOB: 10/28/1955 (61 yrs)     Sex: Female Primary Care Physician: Dante Gang, MD   Marital Status: Married Referring Physician:       No ref. provider found   Race: White or Caucasian     Ethnicity: Non Hispanic/Latino     Emergency Contacts  Name Home Phone Work Phone Mobile Phone Relationship KALENE, CUTLER [phone]   [phone] Spouse         Guarantor Information    Guarantor Name: KRYSSA, RISENHOOVER ID: [id number]   Guarantor Relationship to Pt: Self Guarantor Type: Personal/Family   Guarantor DOB:   08-31-1955     Guarantor Address: [address]   [place], [place] [address]       Guarantor Home Phone: [phone] Guarantor Employer:        Guarantor Work Phone:  Pensions consultant Emp Phone:               [employer] Name: FEP BCBS-FEP BCBS Subscriber Name: [employer]   Insurance Address:   [address]  [place], [place] [address] Subscriber DOB: [birth year]     Subscriber ID: [id number]   Insurance Phone:  Pt Relationship to Sub:   Self   Insurance ID:      Group Name:  Preauthorization #:    Group #: [id number] Preauthorization Days:      Secondary Insurance    Insurance Name: COMMERCIAL GENERIC-GENERIC COMMERCIAL Subscriber Name: SHERRIA, RIEMANN   Insurance Address:     ,  [address] Subscriber DOB: 02/03/1953     Subscriber ID: [id number]   Insurance Phone:  Pt Relationship to Sub:   Spouse   Insurance ID:      Group Name:  Preauthorization #:    Group #: [id number] Preauthorization Days:      [employer] Name: - Subscriber Name:    Community education officer Address:     ,   Statistician DOB:      Subscriber ID:    Press photographer:  Pt Relationship to Sub:  Insurance ID:      Group Name:  Preauthorization #:    Group #:  Preauthorization Days:

## 2018-01-08 ENCOUNTER — Telehealth (INDEPENDENT_AMBULATORY_CARE_PROVIDER_SITE_OTHER): Payer: Self-pay | Admitting: Urology

## 2018-01-09 ENCOUNTER — Ambulatory Visit (INDEPENDENT_AMBULATORY_CARE_PROVIDER_SITE_OTHER): Payer: BLUE CROSS/BLUE SHIELD | Admitting: Urology

## 2018-01-09 ENCOUNTER — Encounter (INDEPENDENT_AMBULATORY_CARE_PROVIDER_SITE_OTHER): Payer: Self-pay | Admitting: Urology

## 2018-01-09 VITALS — BP 133/86 | HR 96 | Ht 61.0 in | Wt 200.0 lb

## 2018-01-09 DIAGNOSIS — Z9889 Other specified postprocedural states: Secondary | ICD-10-CM

## 2018-01-09 DIAGNOSIS — Z87442 Personal history of urinary calculi: Secondary | ICD-10-CM

## 2018-01-09 DIAGNOSIS — R300 Dysuria: Secondary | ICD-10-CM

## 2018-01-09 DIAGNOSIS — R339 Retention of urine, unspecified: Secondary | ICD-10-CM

## 2018-01-09 NOTE — Patient Instructions (Addendum)
-   catheter was removed this morning  - if you have not urinated by 2 PM (or earlier if you are uncomfortable) return immediately to the office for a bladder scan  - will plan for an appointment next week to check a post void residual here in the office with Nedra Hai  - urine for culture today     --------------  I appreciate the opportunity to help take care of you and your family. Here is some basic housekeeping:      1) The FASTEST way to receive results and communicate with our office is via the portal, MyChart.  Please make sure you sign up and have an active account.    2) Please be aware that I often send results/next steps via MyChart. Please check your account regularly.  Occasionally notes that I send do not trigger email warnings, if you have not heard from me regarding pending results please check MyChart prior to calling the office.    3) There are some results that take up to 10 business days to be received and/or processed. If you have not heard from me regarding results in 7-10 business days please feel free to call [phone]) or send a message via MyChart.     4) Messages left in the general voice mailbox are not always able to be returned the same day.  If you have an urgent matter, please speak to a nurse or secretary directly    5) If you have surgical questions please call Stefanie for surgical planning and follow-up [phone].

## 2018-01-09 NOTE — Progress Notes (Signed)
Subjective:      Patient ID: Chelsea Montoya is a 62 y.o. female     Chief Complaint:  12/2017- spinal fusion complicated with infection    Pt was not able to urinate after hospitalization.   Pt has baseline bowel problems mixed diarrhea no constipation. Pt was having urinary incontinence     Prior to hospitalization did not have many urinary complaints.   Pt has nocturia 1-2 times depending on fluids. Not bothersome.     2- vaginal deliveries    Pt has history of renal stone. Last stone 2010    The following portions of the patient's history were reviewed and updated as appropriate: allergies, current medications, past family history, past medical history, past social history, past surgical history and problem list.    Review of Systems  Systems reviewed per the HPI and below:     History obtained from the patient     General ROS: Pt otherwise feeling well, no recent illness.       Ophthalmic ROS: negative for blurry vision or yellowing of the eyes     Allergy and Immunology ROS: known/unknown allergies as described by the patient     Hematological and Lymphatic ROS: No known bleeding/clotting disorders     Endocrine ROS: no significant hot/cold spells     Respiratory ROS: no cough, shortness of breath, or wheezing     Cardiovascular ROS: no chest pain or dyspnea on exertion     Gastrointestinal ROS: irregular bowel movements      Musculoskeletal ROS:  + swelling to lower extremities even prior to surgery , no back pain     Neurological ROS: no focal weakness     Dermatological ROS: no new rashes or lesions     Objective:   BP 133/86   Pulse 96   Ht 1.549 m (5\' 1" )   Wt 90.7 kg (200 lb)   BMI 37.79 kg/m    vital signs reviewed  Physical Exam   Constitutional:  Well-developed, well-nourished, and in no distress.   Cardiovascular: Normal rate.   Pulmonary/Chest: Effort normal.   Abdominal: Soft. Pt exhibits no distension and no mass. There is no tenderness. There is no rebound and no guarding.   Cathter removed  today   Musculoskeletal: Normal range of motion.   Neurological: Pt is alert and oriented to person, place, and time.   Skin: Skin is warm and dry.   Psychiatric: Mood, memory, affect and judgment normal.         Lab Review   Reviewed results as listed below. Discussed findings with patient.     01/05/2018- Cr= 0.6    Radiology Review   Reviewed results as listed below. Discussed results with the patient and answered questions to the best of my ability.   09/27/2015- ct scan- right duplicated system, no     Assessment:     1. Urinary retention    2. S/P cervical discectomy    3. History of nephrolithiasis    4. Dysuria        Pt is on antibiotics for neck, will hold off on treating dysuria unless culture is positive   - pt is still quite ill with infection. Concern for possible neurologic component as well will monitor closely     Plan:   Patient Instructions   - catheter was removed this morning  - if you have not urinated by 2 PM (or earlier if you are uncomfortable) return immediately to the  office for a bladder scan  - will plan for an appointment next week to check a post void residual here in the office  - urine for culture today     --------------  I appreciate the opportunity to help take care of you and your family. Here is some basic housekeeping:      1) The FASTEST way to receive results and communicate with our office is via the portal, MyChart.  Please make sure you sign up and have an active account.    2) Please be aware that I often send results/next steps via MyChart. Please check your account regularly.  Occasionally notes that I send do not trigger email warnings, if you have not heard from me regarding pending results please check MyChart prior to calling the office.    3) There are some results that take up to 10 business days to be received and/or processed. If you have not heard from me regarding results in 7-10 business days please feel free to call [phone]) or send a message via MyChart.      4) Messages left in the general voice mailbox are not always able to be returned the same day.  If you have an urgent matter, please speak to a nurse or secretary directly    5) If you have surgical questions please call Stefanie for surgical planning and follow-up [phone].                    Orders  Orders Placed This Encounter   Procedures   . Urine culture

## 2018-01-09 NOTE — Telephone Encounter (Signed)
That is great.  Dr. Dimple Casey is wicked smart

## 2018-01-16 ENCOUNTER — Ambulatory Visit (INDEPENDENT_AMBULATORY_CARE_PROVIDER_SITE_OTHER): Payer: BLUE CROSS/BLUE SHIELD

## 2018-01-16 MED FILL — Vancomycin HCl-Sodium Chloride IV Soln 1.5 GM/500ML-0.9%: INTRAVENOUS | Qty: 500 | Status: AC

## 2018-04-15 ENCOUNTER — Inpatient Hospital Stay: Payer: BLUE CROSS/BLUE SHIELD

## 2018-04-15 ENCOUNTER — Emergency Department: Payer: BLUE CROSS/BLUE SHIELD

## 2018-04-15 ENCOUNTER — Inpatient Hospital Stay
Admission: EM | Admit: 2018-04-15 | Discharge: 2018-04-19 | DRG: 595 | Disposition: A | Discharge status: Home / Self Care | Payer: BLUE CROSS/BLUE SHIELD | Attending: Internal Medicine | Admitting: Internal Medicine

## 2018-04-15 DIAGNOSIS — Z79899 Other long term (current) drug therapy: Secondary | ICD-10-CM

## 2018-04-15 DIAGNOSIS — K5010 Crohn's disease of large intestine without complications: Secondary | ICD-10-CM | POA: Diagnosis present

## 2018-04-15 DIAGNOSIS — L508 Other urticaria: Secondary | ICD-10-CM

## 2018-04-15 DIAGNOSIS — J45909 Unspecified asthma, uncomplicated: Secondary | ICD-10-CM | POA: Diagnosis present

## 2018-04-15 DIAGNOSIS — F419 Anxiety disorder, unspecified: Secondary | ICD-10-CM | POA: Diagnosis present

## 2018-04-15 DIAGNOSIS — I341 Nonrheumatic mitral (valve) prolapse: Secondary | ICD-10-CM | POA: Diagnosis present

## 2018-04-15 DIAGNOSIS — M546 Pain in thoracic spine: Secondary | ICD-10-CM

## 2018-04-15 DIAGNOSIS — Z9049 Acquired absence of other specified parts of digestive tract: Secondary | ICD-10-CM

## 2018-04-15 DIAGNOSIS — Z7951 Long term (current) use of inhaled steroids: Secondary | ICD-10-CM

## 2018-04-15 DIAGNOSIS — L299 Pruritus, unspecified: Secondary | ICD-10-CM

## 2018-04-15 DIAGNOSIS — Z885 Allergy status to narcotic agent status: Secondary | ICD-10-CM

## 2018-04-15 DIAGNOSIS — Z883 Allergy status to other anti-infective agents status: Secondary | ICD-10-CM

## 2018-04-15 DIAGNOSIS — Z96653 Presence of artificial knee joint, bilateral: Secondary | ICD-10-CM | POA: Diagnosis present

## 2018-04-15 DIAGNOSIS — Z981 Arthrodesis status: Secondary | ICD-10-CM

## 2018-04-15 DIAGNOSIS — Z91040 Latex allergy status: Secondary | ICD-10-CM

## 2018-04-15 DIAGNOSIS — R0902 Hypoxemia: Secondary | ICD-10-CM | POA: Diagnosis present

## 2018-04-15 DIAGNOSIS — M25512 Pain in left shoulder: Secondary | ICD-10-CM

## 2018-04-15 DIAGNOSIS — J189 Pneumonia, unspecified organism: Secondary | ICD-10-CM | POA: Diagnosis present

## 2018-04-15 DIAGNOSIS — I10 Essential (primary) hypertension: Secondary | ICD-10-CM | POA: Diagnosis present

## 2018-04-15 DIAGNOSIS — Z881 Allergy status to other antibiotic agents status: Secondary | ICD-10-CM

## 2018-04-15 DIAGNOSIS — M1990 Unspecified osteoarthritis, unspecified site: Secondary | ICD-10-CM | POA: Diagnosis present

## 2018-04-15 DIAGNOSIS — B029 Zoster without complications: Principal | ICD-10-CM | POA: Diagnosis present

## 2018-04-15 LAB — CBC AND DIFFERENTIAL
Absolute NRBC: 0 10*3/uL (ref 0.00–0.00)
Basophils Absolute Automated: 0.06 10*3/uL (ref 0.00–0.08)
Basophils Automated: 0.5 %
Eosinophils Absolute Automated: 0.16 10*3/uL (ref 0.00–0.44)
Eosinophils Automated: 1.4 %
Hematocrit: 41.6 % (ref 34.7–43.7)
Hgb: 13.6 g/dL (ref 11.4–14.8)
Immature Granulocytes Absolute: 0.02 10*3/uL (ref 0.00–0.07)
Immature Granulocytes: 0.2 %
Lymphocytes Absolute Automated: 1.82 10*3/uL (ref 0.42–3.22)
Lymphocytes Automated: 16.5 %
MCH: 28.6 pg (ref 25.1–33.5)
MCHC: 32.7 g/dL (ref 31.5–35.8)
MCV: 87.6 fL (ref 78.0–96.0)
MPV: 9.1 fL (ref 8.9–12.5)
Monocytes Absolute Automated: 0.84 10*3/uL (ref 0.21–0.85)
Monocytes: 7.6 %
Neutrophils Absolute: 8.15 10*3/uL — ABNORMAL HIGH (ref 1.10–6.33)
Neutrophils: 73.8 %
Nucleated RBC: 0 /100 WBC (ref 0.0–0.0)
Platelets: 265 10*3/uL (ref 142–346)
RBC: 4.75 10*6/uL (ref 3.90–5.10)
RDW: 14 % (ref 11–15)
WBC: 11.05 10*3/uL — ABNORMAL HIGH (ref 3.10–9.50)

## 2018-04-15 LAB — URINALYSIS, REFLEX TO MICROSCOPIC EXAM IF INDICATED
Bilirubin, UA: NEGATIVE
Blood, UA: NEGATIVE
Glucose, UA: NEGATIVE
Ketones UA: NEGATIVE
Nitrite, UA: NEGATIVE
Protein, UR: NEGATIVE
Specific Gravity UA: 1.015 (ref 1.001–1.035)
Urine pH: 6 (ref 5.0–8.0)
Urobilinogen, UA: NORMAL mg/dL (ref 0.2–2.0)

## 2018-04-15 LAB — BASIC METABOLIC PANEL
BUN: 5 mg/dL — ABNORMAL LOW (ref 7.0–19.0)
CO2: 26 mEq/L (ref 22–29)
Calcium: 9.2 mg/dL (ref 8.5–10.5)
Chloride: 103 mEq/L (ref 100–111)
Creatinine: 0.6 mg/dL (ref 0.6–1.0)
Glucose: 111 mg/dL — ABNORMAL HIGH (ref 70–100)
Potassium: 4.4 mEq/L (ref 3.5–5.1)
Sodium: 137 mEq/L (ref 136–145)

## 2018-04-15 LAB — SEDIMENTATION RATE: Sed Rate: 48 mm/Hr — ABNORMAL HIGH (ref 0–20)

## 2018-04-15 LAB — IHS D-DIMER: D-Dimer: 0.82 ug/mL FEU — ABNORMAL HIGH (ref 0.00–0.70)

## 2018-04-15 LAB — TROPONIN I: Troponin I: <0.01 ng/mL (ref 0.00–0.05)

## 2018-04-15 LAB — GFR: EGFR: >60

## 2018-04-15 LAB — C-REACTIVE PROTEIN: C-Reactive Protein: 5.5 mg/dL — ABNORMAL HIGH (ref 0.0–0.8)

## 2018-04-15 MED ORDER — ONDANSETRON HCL 4 MG/2ML IJ SOLN
4.00 mg | Freq: Once | INTRAMUSCULAR | Status: AC
Start: 2018-04-15 — End: 2018-04-15
  Administered 2018-04-15: 10:00:00 4 mg via INTRAVENOUS
  Filled 2018-04-15: qty 2

## 2018-04-15 MED ORDER — MORPHINE SULFATE 4 MG/ML IJ/IV SOLN (WRAP)
4.0000 mg | Freq: Once | Status: AC
Start: 2018-04-15 — End: 2018-04-15
  Administered 2018-04-15: 14:00:00 4 mg via INTRAVENOUS
  Filled 2018-04-15: qty 1

## 2018-04-15 MED ORDER — HYDROMORPHONE HCL 0.5 MG/0.5 ML IJ SOLN
0.40 mg | Freq: Once | INTRAMUSCULAR | Status: AC
Start: 2018-04-15 — End: 2018-04-15
  Administered 2018-04-15: 15:00:00 0.4 mg via INTRAVENOUS
  Filled 2018-04-15: qty 1

## 2018-04-15 MED ORDER — ALBUTEROL SULFATE (2.5 MG/3ML) 0.083% IN NEBU
2.50 mg | INHALATION_SOLUTION | Freq: Four times a day (QID) | RESPIRATORY_TRACT | Status: DC | PRN
Start: 2018-04-15 — End: 2018-04-19

## 2018-04-15 MED ORDER — LORAZEPAM 2 MG/ML IJ SOLN
1.00 mg | INTRAMUSCULAR | Status: AC
Start: 2018-04-15 — End: 2018-04-15
  Administered 2018-04-15: 22:00:00 1 mg via INTRAVENOUS
  Filled 2018-04-15: qty 1

## 2018-04-15 MED ORDER — ALPRAZOLAM 0.5 MG PO TABS
0.50 mg | ORAL_TABLET | Freq: Every evening | ORAL | Status: DC | PRN
Start: 2018-04-15 — End: 2018-04-19

## 2018-04-15 MED ORDER — OXYCODONE HCL 5 MG PO TABS
10.00 mg | ORAL_TABLET | Freq: Four times a day (QID) | ORAL | Status: DC | PRN
Start: 2018-04-15 — End: 2018-04-16
  Administered 2018-04-15 – 2018-04-16 (×3): 10 mg via ORAL
  Filled 2018-04-15 (×3): qty 2

## 2018-04-15 MED ORDER — ONDANSETRON HCL 4 MG/2ML IJ SOLN
4.00 mg | Freq: Four times a day (QID) | INTRAMUSCULAR | Status: DC | PRN
Start: 2018-04-15 — End: 2018-04-19
  Administered 2018-04-16: 4 mg via INTRAVENOUS
  Filled 2018-04-15: qty 2

## 2018-04-15 MED ORDER — FLUTICASONE FUROATE-VILANTEROL 100-25 MCG/INH IN AEPB
1.00 | INHALATION_SPRAY | Freq: Every morning | RESPIRATORY_TRACT | Status: DC
Start: 2018-04-15 — End: 2018-04-19
  Administered 2018-04-16 – 2018-04-18 (×3): 1 via RESPIRATORY_TRACT
  Filled 2018-04-15: qty 14

## 2018-04-15 MED ORDER — LIDOCAINE 5 % EX PTCH
1.00 | MEDICATED_PATCH | CUTANEOUS | Status: DC
Start: 2018-04-15 — End: 2018-04-19
  Administered 2018-04-15 – 2018-04-18 (×4): 1 via TRANSDERMAL
  Filled 2018-04-15 (×4): qty 1

## 2018-04-15 MED ORDER — ONDANSETRON 4 MG PO TBDP
4.00 mg | ORAL_TABLET | Freq: Four times a day (QID) | ORAL | Status: DC | PRN
Start: 2018-04-15 — End: 2018-04-19

## 2018-04-15 MED ORDER — LORAZEPAM 2 MG/ML IJ SOLN
2.00 mg | Freq: Once | INTRAMUSCULAR | Status: DC
Start: 2018-04-15 — End: 2018-04-15

## 2018-04-15 MED ORDER — GABAPENTIN 100 MG PO CAPS
100.00 mg | ORAL_CAPSULE | Freq: Three times a day (TID) | ORAL | Status: DC
Start: 2018-04-15 — End: 2018-04-15

## 2018-04-15 MED ORDER — BUPROPION HCL ER (XL) 300 MG PO TB24
300.00 mg | ORAL_TABLET | Freq: Every day | ORAL | Status: DC
Start: 2018-04-15 — End: 2018-04-19
  Administered 2018-04-15 – 2018-04-19 (×5): 300 mg via ORAL
  Filled 2018-04-15 (×7): qty 1

## 2018-04-15 MED ORDER — ONDANSETRON HCL 4 MG/2ML IJ SOLN
4.00 mg | Freq: Once | INTRAMUSCULAR | Status: AC
Start: 2018-04-15 — End: 2018-04-15
  Administered 2018-04-15: 14:00:00 4 mg via INTRAVENOUS
  Filled 2018-04-15: qty 2

## 2018-04-15 MED ORDER — SERTRALINE HCL 50 MG PO TABS
25.00 mg | ORAL_TABLET | Freq: Every day | ORAL | Status: DC
Start: 2018-04-15 — End: 2018-04-19
  Administered 2018-04-15 – 2018-04-18 (×4): 25 mg via ORAL
  Filled 2018-04-15 (×4): qty 1

## 2018-04-15 MED ORDER — DIPHENHYDRAMINE HCL 50 MG/ML IJ SOLN
6.25 mg | Freq: Four times a day (QID) | INTRAMUSCULAR | Status: DC | PRN
Start: 2018-04-15 — End: 2018-04-19
  Administered 2018-04-17 – 2018-04-18 (×2): 6.25 mg via INTRAVENOUS
  Filled 2018-04-15 (×2): qty 1

## 2018-04-15 MED ORDER — SODIUM CHLORIDE 0.9 % IV SOLN
INTRAVENOUS | Status: DC
Start: 2018-04-15 — End: 2018-04-15

## 2018-04-15 MED ORDER — ENOXAPARIN SODIUM 40 MG/0.4ML SC SOLN
40.00 mg | Freq: Every day | SUBCUTANEOUS | Status: DC
Start: 2018-04-15 — End: 2018-04-19
  Administered 2018-04-15 – 2018-04-19 (×5): 40 mg via SUBCUTANEOUS
  Filled 2018-04-15 (×5): qty 0.4

## 2018-04-15 MED ORDER — MORPHINE SULFATE 4 MG/ML IJ/IV SOLN (WRAP)
4.0000 mg | Freq: Once | Status: AC
Start: 2018-04-15 — End: 2018-04-15
  Administered 2018-04-15: 10:00:00 4 mg via INTRAVENOUS
  Filled 2018-04-15: qty 1

## 2018-04-15 MED ORDER — SODIUM CHLORIDE 0.9 % IV SOLN
INTRAVENOUS | Status: DC
Start: 2018-04-15 — End: 2018-04-16

## 2018-04-15 MED ORDER — NALOXONE HCL 0.4 MG/ML IJ SOLN (WRAP)
0.20 mg | INTRAMUSCULAR | Status: DC | PRN
Start: 2018-04-15 — End: 2018-04-19

## 2018-04-15 MED ORDER — HYDROMORPHONE HCL 0.5 MG/0.5 ML IJ SOLN
0.40 mg | Freq: Three times a day (TID) | INTRAMUSCULAR | Status: AC | PRN
Start: 2018-04-15 — End: 2018-04-17
  Administered 2018-04-15 – 2018-04-16 (×3): 0.4 mg via INTRAVENOUS
  Filled 2018-04-15 (×4): qty 1

## 2018-04-15 MED ORDER — MELATONIN 3 MG PO TABS
6.00 mg | ORAL_TABLET | Freq: Every evening | ORAL | Status: DC
Start: 2018-04-15 — End: 2018-04-19
  Administered 2018-04-15 – 2018-04-18 (×4): 6 mg via ORAL
  Filled 2018-04-15 (×4): qty 2

## 2018-04-15 NOTE — ED Notes (Signed)
NURSING NOTE FOR THE RECEIVING INPATIENT NURSE     ED NURSE Myer Peer   [facility] 1610   ED CHARGE NURSE [id number]   ADMISSION INFORMATION   Chelsea Montoya is a 62 y.o. female admitted with a diagnosis of:    1. Thoracic back pain, unspecified back pain laterality, unspecified chronicity       Isolation:  None  Place of residence / Living situation:  Home Independent   NURSING CARE   Mental Status alert and oriented   ADL ADLs:            Independent with all ADLs  Ambulation:  No difficulty   Pertinent Information  and Safety Concerns Sister at the bedside      VITAL SIGNS     Time of Last Set of Vitals 1230   Temperature 98.4   BP 132/76   Pulse 85   Respirations 16   Pulse OX 94        IV LINES     Peripheral IV 04/15/18 Left Hand (Active)   Placement Date/Time: 04/15/18 1015   Size (Gauge): 22 G  Orientation: Left  Location: Hand  Site Prep: Chlorhexidine   Patient Tolerance: Tolerated well        LAB RESULTS     Labs Reviewed   CBC AND DIFFERENTIAL - Abnormal; Notable for the following components:       Result Value    WBC 11.05 (*)     Neutrophils Absolute 8.15 (*)     All other components within normal limits   BASIC METABOLIC PANEL - Abnormal; Notable for the following components:    Glucose 111 (*)     BUN 5.0 (*)     All other components within normal limits   SEDIMENTATION RATE - Abnormal; Notable for the following components:    Sed Rate 48 (*)     All other components within normal limits   C-REACTIVE PROTEIN - Abnormal; Notable for the following components:    C-Reactive Protein 5.5 (*)     All other components within normal limits   TROPONIN I   GFR   URINALYSIS, REFLEX TO MICROSCOPIC EXAM IF INDICATED

## 2018-04-15 NOTE — Plan of Care (Signed)
Problem: Safety  Goal: Patient will be free from injury during hospitalization  Outcome: Progressing  Flowsheets (Taken 04/15/2018 1842)  Patient will be free from injury during hospitalization : Assess patient's risk for falls and implement fall prevention plan of care per policy; Use appropriate transfer methods; Provide and maintain safe environment; Ensure appropriate safety devices are available at the bedside; Include patient/ family/ care giver in decisions related to safety; Hourly rounding; Assess for patients risk for elopement and implement Elopement Risk Plan per policy; Provide alternative method of communication if needed (communication boards, writing)  Goal: Patient will be free from infection during hospitalization  Outcome: Progressing     Problem: Pain  Goal: Pain at adequate level as identified by patient  Outcome: Progressing     Problem: Side Effects from Pain Analgesia  Goal: Patient will experience minimal side effects of analgesic therapy  Outcome: Progressing  Flowsheets (Taken 04/15/2018 1842)  Patient will experience minimal side effects of analgesic therapy: Monitor/assess patient's respiratory status (RR depth, effort, breath sounds); Prevent/manage side effects per LIP orders (i.e. nausea, vomiting, pruritus, constipation, urinary retention, etc.); Evaluate for opioid-induced sedation with appropriate assessment tool (i.e. POSS); Assess for changes in cognitive function     Problem: Discharge Barriers  Goal: Patient will be discharged home or other facility with appropriate resources  Outcome: Progressing  Flowsheets (Taken 04/15/2018 1842)  Discharge to home or other facility with appropriate resources: Provide appropriate patient education; Initiate discharge planning     Problem: Psychosocial and Spiritual Needs  Goal: Demonstrates ability to cope with hospitalization/illness  Outcome: Progressing     Problem: Compromised Tissue integrity  Goal: Damaged tissue is healing and  protected  Outcome: Progressing  Goal: Nutritional status is improving  Outcome: Progressing     Problem: Moderate/High Fall Risk Score >5  Goal: Patient will remain free of falls  Outcome: Progressing  Flowsheets (Taken 04/15/2018 1724)  High (Greater than 13): HIGH-Activate bed/chair exit alarm where available

## 2018-04-15 NOTE — ED Provider Notes (Signed)
[name]         CLINICAL SUMMARY          Diagnosis:    .     Final diagnoses:   Thoracic back pain, unspecified back pain laterality, unspecified chronicity         MDM Notes:    Patient is a 62 year old female who presents to the ER with worsening left scapula pain for the past 5 days.  Patient states 3 days ago pain intensified she drove down to [place] approximately 4 to 4-1/2-hour drive walked around all day at [facility] the next day and then drove back home yesterday.  Patient denies any calf pain or swelling.  Patient denies one-sided weakness or any new numbness.  On exam patient with 3 erythematous, blister rash noted to scapula and lateral chest wall on the left.  Labs show improvement of BUN, slightly elevated white count, EKG with nonspecific T wave inversions, CXR no acute disease and stable cardiomegaly. Pt with elevated ESR and CRP.  Spoke with patient's surgeon Dr. Kizzie Bane who recommends admission to [facility] hospitalist.  Spoke with [facility] hospitalist who accepts admission.    Differential diagnosis: Discitis, back strain, shingles, AMI      Disposition:         Admit              CLINICAL INFORMATION        HPI:      Chief Complaint: Back Pain  .      Chelsea Montoya is a 62 y.o. female past medical history of cervical discectomy in July 2019, anxiety who presents with left posterior scapula pain for the past 5 days.  Patient states the pain intensified 3 days ago states that she drove down to [place] spent the day walking around.  Patient denies any new weakness or numbness.  Patient states after discectomy she has chronic numbness and weakness on the right.  Patient states that this morning she took 2 Percocet which mildly relieved her pain, she called Dr. Kizzie Bane office who recommended she come to the ER for further evaluation.  Patient denies calf pain, swelling, pain with deep breathing, fever, cough, abdominal pain, nausea, vomiting,  headache.    History obtained from: Patient            ROS:      Positive and negative ROS elements as per HPI.  All other systems reviewed and negative.      Physical Exam:      Pulse 98  BP (!) 120/91  Resp 16  SpO2 97 %  Temp 98.9 F (37.2 C)    Physical Exam  Vitals signs and nursing note reviewed.   Constitutional:       Appearance: Normal appearance.   HENT:      Head: Normocephalic and atraumatic.      Mouth/Throat:      Mouth: Mucous membranes are moist.   Eyes:      Extraocular Movements: Extraocular movements intact.      Pupils: Pupils are equal, round, and reactive to light.   Neck:      Musculoskeletal: Normal range of motion and neck supple.   Cardiovascular:      Rate and Rhythm: Normal rate and regular rhythm.   Pulmonary:      Effort: Pulmonary effort is normal.      Breath sounds: Normal breath sounds.      Comments: Tenderness to palpation left posterior  scapular, rib, no subcutaneous emphysema noted, no step-offs  Abdominal:      General: Bowel sounds are normal. There is no distension.      Palpations: Abdomen is soft.      Tenderness: There is no tenderness. There is no guarding or rebound.   Musculoskeletal:      Comments: Mild midline thoracic tenderness to palpation, no step-offs, no erythema noted.   Skin:     General: Skin is warm.      Comments: 3 clustered erythematous blister noted to posterior scapula, no purulent drainage, discharge or cellulitic changes noted.   Neurological:      General: No focal deficit present.      Mental Status: She is alert and oriented to person, place, and time.   Psychiatric:         Mood and Affect: Mood normal.         Behavior: Behavior normal.                 PAST HISTORY        Primary Care Provider: Dante Gang, MD        PMH/PSH:    .     Past Medical History:   Diagnosis Date   . Anemia     resolved   . Anxiety    . Arrhythmia     followed by cardio    . Arthritis     osteoarthritis   . Asthma without status asthmaticus      allergy-induced only- LD 09/2017   . Blood transfusion without reported diagnosis     14 years ago   . Complication of anesthesia     wake up fast   . Crohn's colitis 01/31/2012   . Crohn's disease     remission - colonoscopy    . Duplicated right renal collecting system    . Heart murmur     followed by cardio    . Hypertension     Runs 120/78's   . Insomnia    . Kidney stone    . Mitral valve prolapse    . Pneumonia    . Pyelonephritis    . Seizures     Just once in 1978- stress induced    . Shortness of breath     R/T allergy   . Thyroid nodule     BILATERAL 09/2014; RT LOBE THYROID NODULE BX - BENIGN   . Vision abnormalities        She has a past surgical history that includes Appendectomy (2000's); Gynecologic cryosurgery (1970's); Colectomy (1988); Bunionectomy (Right, 1998); CLOSED REDUCTION, PERCUTANEOUS FIXATION, FINGER (10/28/2012); Colonoscopy w/ biopsies (02/2016 2019); Endometrial biopsy (04/22/2014); THYROID NODULE BIOPSY (09/2014); Knee Arthroplasty (Right, 2001, 2016); Knee Arthroplasty (Left, 2009); Elbow surgery (Right, 2010); FUSION, ANTERIOR CERVICAL, LEVELS 3+ (N/A, 12/26/2017); and INCISION & DRAINAGE, WOUND (N/A, 01/01/2018).      Social/Family History:      She reports that she has never smoked. She has never used smokeless tobacco. She reports current alcohol use. She reports that she does not use drugs.    Family History   Problem Relation Age of Onset   . Ovarian cancer Paternal Aunt 60        died of complications of ovarian ca   . Lung cancer Mother         complications of lung ca   . Kidney failure Father    . Ovarian cancer Paternal Aunt  survivor   . Uterine cancer Maternal Grandmother 50        survivior   . Breast cancer Paternal Grandmother    . Colon cancer Neg Hx          Listed Medications on Arrival:    .     Previous Medications    ALBUTEROL (PROAIR HFA) 108 (90 BASE) MCG/ACT INHALER    USE 1-2 PUFFS EVERY 4 HOURS AS NEEDED    CEFEPIME 2 G IN SODIUM CHLORIDE 0.9 % 100 ML IVPB  MINI-BAG PLUS    Infuse 2 g into the vein every 8 (eight) hours    DAPTOMYCIN 500 MG IN SODIUM CHLORIDE 0.9 % 100 ML IVPB    Infuse 500 mg into the vein every 24 hours    DIAZEPAM (VALIUM) 5 MG TABLET    Take 1 tablet (5 mg total) by mouth every 8 (eight) hours as needed for Anxiety    DICYCLOMINE (BENTYL) 20 MG TABLET    TAKE 1/2 TABLET BY MOUTH THREE TIMES A DAY AS NEEDED (10mg )    FLUTICASONE-SALMETEROL (ADVAIR DISKUS) 250-50 MCG/DOSE AEROSOL POWDER, BREATH ACTIVTIVATEDE    as needed    LORATADINE-PSEUDOEPHEDRINE (CLARITIN-D 12 HOUR PO)    Take by mouth as needed        NALOXONE (NARCAN) 4 MG/0.1ML NASAL SPRAY    1 spray intranasally. If pt does not respond or relapses into respiratory depression call 911. Give additional doses every 2-3 min.    OMEPRAZOLE (PRILOSEC PO)    Take by mouth Dose unknown    OXYCODONE-ACETAMINOPHEN (PERCOCET) 5-325 MG PER TABLET    Take by mouth.    TAMSULOSIN (FLOMAX) 0.4 MG CAP    Take 1 capsule (0.4 mg total) by mouth Daily after dinner      Allergies: She is allergic to flexeril [cyclobenzaprine]; augmentin [amoxicillin-pot clavulanate]; darvon; keflex [cephalexin]; latex; and ciprofloxacin.            VISIT INFORMATION        Clinical Course in the ED:      12:07 PM- Dr. Kizzie Bane recommends admission to [facility] asking for CT C-spine and MRI C-spine and thoracic without contrast    12:26 PM- Dr. Annell Greening [facility] hospitalist accepts patient for admission states will place orders for CT cervical spine and MRI cervical spine and thoracic without contrast states we will also contact infectious disease.      Medications Given in the ED:    .     ED Medication Orders (From admission, onward)    Start Ordered     Status Ordering Provider    04/15/18 0935 04/15/18 0934  morphine injection 4 mg  Once     Route: Intravenous  Ordered Dose: 4 mg     Last MAR action:  Given Riely Baskett L    04/15/18 0935 04/15/18 0934  ondansetron (ZOFRAN) injection 4 mg  Once     Route: Intravenous  Ordered  Dose: 4 mg     Last MAR action:  Given Dontel Harshberger L            Procedures:      Procedures      Interpretations:      EKG -             interpreted by me: normal sinus at 81.  No ST abnormalities and QRS complexes normal, no ectopy, axis is normal, nonspecific T wave inversion in lead III, V1 through V4  Monitor -  interpreted by me: normal sinus at 86.               RESULTS        Lab Results:      Results     Procedure Component Value Units Date/Time    Sedimentation Rate (ESR), Manual [[id number]]  (Abnormal) Collected:  04/15/18 0938    Specimen:  Blood Updated:  04/15/18 1214     Sed Rate 48 mm/Hr     C-reactive Protein (CRP) [[id number]]  (Abnormal) Collected:  04/15/18 0938    Specimen:  Blood Updated:  04/15/18 1159     C-Reactive Protein 5.5 mg/dL     Troponin I [[id number]] Collected:  04/15/18 1001    Specimen:  Blood Updated:  04/15/18 1038     Troponin I <0.01 ng/mL     Basic Metabolic Panel [[id number]]  (Abnormal) Collected:  04/15/18 1001    Specimen:  Blood Updated:  04/15/18 1033     Glucose 111 mg/dL      BUN 5.0 mg/dL      Creatinine 0.6 mg/dL      Calcium 9.2 mg/dL      Sodium 629 mEq/L      Potassium 4.4 mEq/L      Chloride 103 mEq/L      CO2 26 mEq/L     GFR [[id number]] Collected:  04/15/18 1001     Updated:  04/15/18 1033     EGFR >60.0    CBC with differential [[id number]]  (Abnormal) Collected:  04/15/18 1001    Specimen:  Blood Updated:  04/15/18 1016     WBC 11.05 x10 3/uL      Hgb 13.6 g/dL      Hematocrit 64.4 %      Platelets 265 x10 3/uL      RBC 4.75 x10 6/uL      MCV 87.6 fL      MCH 28.6 pg      MCHC 32.7 g/dL      RDW 14 %      MPV 9.1 fL      Neutrophils 73.8 %      Lymphocytes Automated 16.5 %      Monocytes 7.6 %      Eosinophils Automated 1.4 %      Basophils Automated 0.5 %      Immature Granulocyte 0.2 %      Nucleated RBC 0.0 /100 WBC      Neutrophils Absolute 8.15 x10 3/uL      Abs Lymph Automated 1.82 x10 3/uL      Abs Mono Automated 0.84 x10 3/uL      Abs Eos  Automated 0.16 x10 3/uL      Absolute Baso Automated 0.06 x10 3/uL      Absolute Immature Granulocyte 0.02 x10 3/uL      Absolute NRBC 0.00 x10 3/uL               Radiology Results:      XR Chest 2 Views   Final Result    No acute disease. Stable cardiomegaly.      Kennyth Lose, MD    04/15/2018 10:13 AM                  Scribe Attestation:      No scribe involved in the care of this patient            Tommy Medal, [place]  04/15/18 1236  Tommy Medal, [place]  04/15/18 1426

## 2018-04-15 NOTE — ED Provider Notes (Signed)
[facility] EMERGENCY DEPARTMENT H&P                                             ATTENDING SUPERVISORY NOTE       ATTENDING NOTE      36F PMH: PMH: Crhon's colitis, pyelo, cervical discectomy by Dr. Kizzie Bane. 12/2017  L posterior shoulder blade pain and rash that started over a week ago, then increased on Wednesday.  Pain worsened on Friday.  Pt has used a magnetic type of OTC heating pad.  Tried 2 percocet PTA but still has pain.  No fevers.  No N/V.  No numbness or focal weakness.     PEX: 3 circular areas of erythema inferior to left posterior scapula. Areas TTP. One area w/ blister. 3 areas do not appears consistent w/ shingles. Likely related to topical magnetic back patch (as they are the same shape and location). No bleeding, drainage, induration, fluctuance.   Lungs clear, no wheezing.   No midline C/T/L step offs or deformities noted.        I spoke to and examined the patient as well: Yes  I was present during key portions of any procedures performed: N/A            VISIT INFORMATION        Clinical Course in the ED:                   Medications Given in the ED:    .     ED Medication Orders (From admission, onward)    Start Ordered     Status Ordering Provider    04/15/18 0935 04/15/18 0934  morphine injection 4 mg  Once     Route: Intravenous  Ordered Dose: 4 mg     Acknowledged POPE, CHRISTINE L    04/15/18 0935 04/15/18 0934  ondansetron (ZOFRAN) injection 4 mg  Once     Route: Intravenous  Ordered Dose: 4 mg     Acknowledged POPE, CHRISTINE L            Procedures:            Interpretations:      O2 sat-           saturation: 97 %; Oxygen use: room air; Interpretation: Normal                 PAST HISTORY        Primary Care Provider: Dante Gang, MD        PMH/PSH:    .     Past Medical History:   Diagnosis Date   . Anemia     resolved   . Anxiety    . Arrhythmia     followed by cardio    . Arthritis     osteoarthritis   . Asthma without status asthmaticus      allergy-induced only- LD 09/2017   . Blood transfusion without reported diagnosis     14 years ago   . Complication of anesthesia     wake up fast   . Crohn's colitis 01/31/2012   . Crohn's disease     remission - colonoscopy    . Duplicated right renal collecting system    . Heart murmur     followed by cardio    . Hypertension  Runs 120/78's   . Insomnia    . Kidney stone    . Mitral valve prolapse    . Pneumonia    . Pyelonephritis    . Seizures     Just once in 1978- stress induced    . Shortness of breath     R/T allergy   . Thyroid nodule     BILATERAL 09/2014; RT LOBE THYROID NODULE BX - BENIGN   . Vision abnormalities        She has a past surgical history that includes Appendectomy (2000's); Gynecologic cryosurgery (1970's); Colectomy (1988); Bunionectomy (Right, 1998); CLOSED REDUCTION, PERCUTANEOUS FIXATION, FINGER (10/28/2012); Colonoscopy w/ biopsies (02/2016 2019); Endometrial biopsy (04/22/2014); THYROID NODULE BIOPSY (09/2014); Knee Arthroplasty (Right, 2001, 2016); Knee Arthroplasty (Left, 2009); Elbow surgery (Right, 2010); FUSION, ANTERIOR CERVICAL, LEVELS 3+ (N/A, 12/26/2017); and INCISION & DRAINAGE, WOUND (N/A, 01/01/2018).      Social/Family History:      She reports that she has never smoked. She has never used smokeless tobacco. She reports current alcohol use. She reports that she does not use drugs.    Family History   Problem Relation Age of Onset   . Ovarian cancer Paternal Aunt 60        died of complications of ovarian ca   . Lung cancer Mother         complications of lung ca   . Kidney failure Father    . Ovarian cancer Paternal Aunt         survivor   . Uterine cancer Maternal Grandmother 50        survivior   . Breast cancer Paternal Grandmother    . Colon cancer Neg Hx          Listed Medications on Arrival:    .     Home Medications     Med List Status:  In Progress Set By: Kerrin Mo, RN at 04/15/2018  9:13 AM                albuterol (PROAIR HFA) 108 (90 Base) MCG/ACT inhaler      USE 1-2 PUFFS EVERY 4 HOURS AS NEEDED     cefepime 2 g in sodium chloride 0.9 % 100 mL IVPB mini-bag plus     Infuse 2 g into the vein every 8 (eight) hours     DAPTOmycin 500 mg in sodium chloride 0.9 % 100 mL IVPB     Infuse 500 mg into the vein every 24 hours     diazePAM (VALIUM) 5 MG tablet     Take 1 tablet (5 mg total) by mouth every 8 (eight) hours as needed for Anxiety     dicyclomine (BENTYL) 20 MG tablet     TAKE 1/2 TABLET BY MOUTH THREE TIMES A DAY AS NEEDED (10mg )     fluticasone-salmeterol (ADVAIR DISKUS) 250-50 MCG/DOSE Aerosol Powder, Breath Activtivatede     as needed     Loratadine-Pseudoephedrine (CLARITIN-D 12 HOUR PO)     Take by mouth as needed         naloxone (NARCAN) 4 MG/0.1ML nasal spray     1 spray intranasally. If pt does not respond or relapses into respiratory depression call 911. Give additional doses every 2-3 min.     Omeprazole (PRILOSEC PO)     Take by mouth Dose unknown     oxyCODONE-acetaminophen (PERCOCET) 5-325 MG per tablet     Take by mouth.     tamsulosin (FLOMAX)  0.4 MG Cap     Take 1 capsule (0.4 mg total) by mouth Daily after dinner         Allergies: She is allergic to flexeril [cyclobenzaprine]; augmentin [amoxicillin-pot clavulanate]; darvon; keflex [cephalexin]; latex; and ciprofloxacin.            RESULTS        Lab Results:      Results     ** No results found for the last 24 hours. **              Radiology Results:      XR Chest 2 Views    (Results Pending)               Attending Attestation:      The patient was seen and examined by the mid-level (physician assistant or nurse practitioner), or fellow, and the plan of care was discussed with me. I agree with the plan as it was presented to me.  I have reviewed and agree with the final ED diagnosis.              Scribe Attestation:      I was acting as a Neurosurgeon for Rudi Rummage, MD on [place] D  Treatment Team: Scribe: Delice Lesch    I am the first provider for this patient and I personally  performed the services documented. Treatment Team: Scribe: Delice Lesch is scribing for me on [facility]. This note accurately reflects work and decisions made by me.  Rudi Rummage, MD              Rudi Rummage, MD  04/16/18 [id number]

## 2018-04-15 NOTE — H&P (Addendum)
[facility] HOSPITALIST ADMISSION HISTORY AND PHYSICAL EXAM    Date Time: 04/15/18 1:35 PM  Patient Name: Chelsea Montoya  Attending Physician: Rudi Rummage, MD  Primary Care Physician: Dante Gang, MD    CC: shoulder rash and pain      History of Presenting Illness:   This is a 62 y/o female with a a pmhz of Crohns dz, HTN and a h/o cervical myelopathy with disc herniation of C3-C7 who is s/p anterior cervical diskcetomy and fusion on  7/3 who presents with left posterior shoulder blade rash and pain * 1 week--    Patient defines the pain as sharp and also " bone pain"-- 10/10. Its located at the inferior portion of the left scapular and mid vertebral area and at times on the right shoulder as well. She denies and SOB or palpitations.  She states she had some pain when visiting [facility] with her family and usually she can take an oxycodone to control but this pain is significantly worse with NO relief with 2 oxycodne which is very rare. Since the pain became more intense she decided to come to the ER.    She denies any fever or chills. No trauma to the area.SHe denies cough or sick contacts.  She has been undergoing Occupational therapy since surgery and is very happy with how strong her right arm has gotten. Prior to the onset of this pain she states that she was doing well and felt pretty good.    She has Crohns but is on no prednisone or immunosuppressants. She has never had the chicken pox, no shingles and cannot recall receiving the Varicella vaccine.    Past Medical History:     Past Medical History:   Diagnosis Date   . Anemia     resolved   . Anxiety    . Arrhythmia     followed by cardio    . Arthritis     osteoarthritis   . Asthma without status asthmaticus     allergy-induced only- LD 09/2017   . Blood transfusion without reported diagnosis     14 years ago   . Complication of anesthesia     wake up fast   . Crohn's colitis 01/31/2012   . Crohn's disease     remission - colonoscopy    . Duplicated  right renal collecting system    . Heart murmur     followed by cardio    . Hypertension     Runs 120/78's   . Insomnia    . Kidney stone    . Mitral valve prolapse    . Pneumonia    . Pyelonephritis    . Seizures     Just once in 1978- stress induced    . Shortness of breath     R/T allergy   . Thyroid nodule     BILATERAL 09/2014; RT LOBE THYROID NODULE BX - BENIGN   . Vision abnormalities        Past Surgical History:     Past Surgical History:   Procedure Laterality Date   . APPENDECTOMY  2000's   . BUNIONECTOMY Right 1998   . CLOSED REDUCTION, PERCUTANEOUS FIXATION, FINGER  10/28/2012    Procedure: CLOSED REDUCTION, PERCUTANEOUS FIXATION, FINGER;  Surgeon: Arta Bruce, MD;  Location: [facility];  Service: Orthopedics;  Laterality: Left;  Percutaneous Fixation of Left Ring Finger.     . COLECTOMY  1988    colon resection   .  COLONOSCOPY W/ BIOPSIES  02/2016 2019    Dr Cinda Quest - 1 POLYP, INT HEMORRHOIDS, CROHN'S IN REMISSION - RTN 2021    . ELBOW SURGERY Right 2010   . ENDOMETRIAL BIOPSY  04/22/2014    RARE SUPERFICIAL ENDOMETRIAL GLANDS AMIDST MUCUS.TISSUE IS INSUFFICIENT FOR THE OPTIMAL EVALUATION OF THE  ENDOMETRIAL CAVITY.   Lavenia Atlas, ANTERIOR CERVICAL, LEVELS 3+ N/A 12/26/2017    Procedure: FUSION, ANTERIOR CERVICAL, LEVELS 3+;  Surgeon: Kizzie Ide, MD;  Location: [facility];  Service: Orthopedics;  Laterality: N/A;  C3-C7 ANTERIOR CERVICAL DISCECTOMY & FUSION   . GYNECOLOGIC CRYOSURGERY  1970's   . INCISION & DRAINAGE, WOUND N/A 01/01/2018    Procedure: INCISION & DRAINAGE, WOUND;  Surgeon: Kizzie Ide, MD;  Location: [facility];  Service: Orthopedics;  Laterality: N/A;   . KNEE ARTHROPLASTY Right 2001, 2016   . KNEE ARTHROPLASTY Left 2009   . THYROID NODULE BIOPSY  09/2014    RT LOBE THYROID NODULE       Family History:     Family History   Problem Relation Age of Onset   . Ovarian cancer Paternal Aunt 60        died of complications of ovarian ca   . Lung cancer Mother          complications of lung ca   . Kidney failure Father    . Ovarian cancer Paternal Aunt         survivor   . Uterine cancer Maternal Grandmother 50        survivior   . Breast cancer Paternal Grandmother    . Colon cancer Neg Hx        Social History:     Social History     Socioeconomic History   . Marital status: Married     Spouse name: Not on file   . Number of children: Not on file   . Years of education: Not on file   . Highest education level: Not on file   Occupational History   . Not on file   Social Needs   . Financial resource strain: Not on file   . Food insecurity:     Worry: Not on file     Inability: Not on file   . Transportation needs:     Medical: Not on file     Non-medical: Not on file   Tobacco Use   . Smoking status: Never Smoker   . Smokeless tobacco: Never Used   Substance and Sexual Activity   . Alcohol use: Yes     Comment: occasionally   . Drug use: No   . Sexual activity: Yes     Partners: Male     Birth control/protection: None   Lifestyle   . Physical activity:     Days per week: Not on file     Minutes per session: Not on file   . Stress: Not on file   Relationships   . Social connections:     Talks on phone: Not on file     Gets together: Not on file     Attends religious service: Not on file     Active member of club or organization: Not on file     Attends meetings of clubs or organizations: Not on file     Relationship status: Not on file   . Intimate partner violence:     Fear of current or ex partner: Not on  file     Emotionally abused: Not on file     Physically abused: Not on file     Forced sexual activity: Not on file   Other Topics Concern   . Not on file   Social History Narrative   . Not on file       Allergies:     Allergies   Allergen Reactions   . Flexeril [Cyclobenzaprine] Nausea And Vomiting     Flu like symptoms   . Augmentin [Amoxicillin-Pot Clavulanate] Nausea And Vomiting   . Darvon Other (See Comments)     Gastric distress     . Keflex [Cephalexin] Other (See  Comments)     Gastric upset.   . Latex    . Ciprofloxacin Rash       Medications:     Medications Prior to Admission   Medication Sig Dispense Refill Last Dose   . albuterol (PROAIR HFA) 108 (90 Base) MCG/ACT inhaler USE 1-2 PUFFS EVERY 4 HOURS AS NEEDED   Past Month   . ALPRAZolam (XANAX) 0.5 MG tablet Take 0.5 mg by mouth nightly as needed   Past Week   . buPROPion XL (WELLBUTRIN XL) 300 MG 24 hr tablet Take 300 mg by mouth daily   04/14/2018   . dicyclomine (BENTYL) 20 MG tablet TAKE 1/2 TABLET BY MOUTH THREE TIMES A DAY AS NEEDED (10mg )  3 Past Week   . fluticasone-salmeterol (ADVAIR DISKUS) 250-50 MCG/DOSE Aerosol Powder, Breath Activtivatede as needed   Past Month   . Loratadine-Pseudoephedrine (CLARITIN-D 12 HOUR PO) Take by mouth as needed       04/14/2018   . melatonin 3 mg tablet Take 6 mg by mouth nightly   04/14/2018   . oxyCODONE (OXY-IR) 5 MG capsule Take 10 mg by mouth as needed   04/15/2018   . sertraline (ZOLOFT) 50 MG tablet Take 25 mg by mouth daily   04/14/2018   . zolpidem (AMBIEN) 10 MG tablet Take 5 mg by mouth nightly as needed for Sleep   04/14/2018           Review of Systems:   All other systems were reviewed and are negative except: as above in HPI    Physical Exam:     Vitals:    04/15/18 1233   BP:    Pulse:    Resp: 16   Temp:    SpO2:        Intake and Output Summary (Last 24 hours) at Date Time  No intake or output data in the 24 hours ending 04/15/18 1335    General: awake, alert, oriented x 3; no acute distress.  HEENT: perrla, eomi, sclera anicteric  oropharynx clear without lesions, mucous membranes moist  Neck: supple, no lymphadenopathy, no thyromegaly, no JVD, no carotid bruits  Cardiovascular: regular rate and rhythm, no murmurs, rubs or gallops  Lungs: clear to auscultation bilaterally, without wheezing, rhonchi, or rales  Abdomen: soft, non-tender, non-distended; no palpable masses, no hepatosplenomegaly, normoactive bowel sounds, no rebound or guarding  Extremities: no  clubbing, cyanosis, or edema  Neuro: cranial nerves grossly intact, strength 5/5 in upper and lower extremities, sensation intact,   Skin: 2 unroofed vesicles with clear discharge at posterior torso - left scapula   TTP at the inferior left scapula area , exquisite tenderness, no warm, mild redness, pain with ROM of left shoulder but joint was not warm or tender  Left wrist/forearm--large raised macules ( ?hives) pruritic  Labs:     Results     Procedure Component Value Units Date/Time    UA, Reflex to Microscopic (pts  3 + yrs) [[id number]] Collected:  04/15/18 1311    Specimen:  Urine Updated:  04/15/18 1327    Sedimentation Rate (ESR), Manual [[id number]]  (Abnormal) Collected:  04/15/18 0938    Specimen:  Blood Updated:  04/15/18 1214     Sed Rate 48 mm/Hr     C-reactive Protein (CRP) [[id number]]  (Abnormal) Collected:  04/15/18 0938    Specimen:  Blood Updated:  04/15/18 1159     C-Reactive Protein 5.5 mg/dL     Troponin I [[id number]] Collected:  04/15/18 1001    Specimen:  Blood Updated:  04/15/18 1038     Troponin I <0.01 ng/mL     Basic Metabolic Panel [[id number]]  (Abnormal) Collected:  04/15/18 1001    Specimen:  Blood Updated:  04/15/18 1033     Glucose 111 mg/dL      BUN 5.0 mg/dL      Creatinine 0.6 mg/dL      Calcium 9.2 mg/dL      Sodium 244 mEq/L      Potassium 4.4 mEq/L      Chloride 103 mEq/L      CO2 26 mEq/L     GFR [[id number]] Collected:  04/15/18 1001     Updated:  04/15/18 1033     EGFR >60.0    CBC with differential [[id number]]  (Abnormal) Collected:  04/15/18 1001    Specimen:  Blood Updated:  04/15/18 1016     WBC 11.05 x10 3/uL      Hgb 13.6 g/dL      Hematocrit 59.5 %      Platelets 265 x10 3/uL      RBC 4.75 x10 6/uL      MCV 87.6 fL      MCH 28.6 pg      MCHC 32.7 g/dL      RDW 14 %      MPV 9.1 fL      Neutrophils 73.8 %      Lymphocytes Automated 16.5 %      Monocytes 7.6 %      Eosinophils Automated 1.4 %      Basophils Automated 0.5 %      Immature Granulocyte 0.2 %      Nucleated  RBC 0.0 /100 WBC      Neutrophils Absolute 8.15 x10 3/uL      Abs Lymph Automated 1.82 x10 3/uL      Abs Mono Automated 0.84 x10 3/uL      Abs Eos Automated 0.16 x10 3/uL      Absolute Baso Automated 0.06 x10 3/uL      Absolute Immature Granulocyte 0.02 x10 3/uL      Absolute NRBC 0.00 x10 3/uL           Radiology Results (24 Hour)     Procedure Component Value Units Date/Time    XR Chest 2 Views [[id number]] Collected:  04/15/18 1012    Order Status:  Completed Updated:  04/15/18 1017    Narrative:       CLINICAL HISTORY: Left back pain.    FINDINGS: AP and lateral views of the chest. Comparison is made to  portable chest 05/07/2015.    No pneumothorax. No focal infiltrate. No overt vascular congestion. No  pleural effusion. Cardiac silhouette is again seen to be enlarged.  Tortuosity of the aorta. Degenerative change  in the spine. Cervical  spinal fusion hardware.      Impression:        No acute disease. Stable cardiomegaly.    Kennyth Lose, MD   04/15/2018 10:13 AM            Assessment:     Patient Active Problem List   Diagnosis   . Crohn's colitis   . History of colon resection   . Mitral valve prolapse   . Pyelonephritis   . Kidney stone   . Arrhythmia   . PGM breast cancer age 50   . paternal aunts x 2 ovarian cancer   . Prolapse of anterior vaginal wall   . HNP (herniated nucleus pulposus) with myelopathy, cervical   . S/P cervical discectomy   . Postoperative fever   . Wound infection after surgery   . Urinary incontinence   . Leukocytosis   . Fever   . Urinary retention   . History of nephrolithiasis   . Dysuria   . Thoracic back pain, unspecified back pain laterality, unspecified chronicity         62 y/o female with asthma, crohns dz s/p cervical discectomy in July who presents with severe left shoulder/ scapula and cervicothoracic pain  Plan:   # ? Infection at previous Cervical operative site  -- Dr. Kizzie Bane - [facility] will see and contacted by ER  -- he requests- MRI and CT scan of cervical and  thoracici spine w/o contrast  -- elevated inflammatory markers--- trend  -- ID consult in AM ( PLEASE CALL IN AM)   -- no Abxs at this time  -- if fever/chills-- PAN CULTURE    #?? Shingles vs musculoskeletal/ costochondritis  --contact precautions  -- consider Valtrex empirically  --monitor area of skin frequently  -- consider toradol/ steroids for pain- inflammation  -- check Varicella titiers  --occupational therpay    # Pain control  - patient with severe pain- not relieved with Morphine (per patient she takes oxycodone very infrequently so this level of pain is rare)  -- w/u as per above    # ?Allergic Reaction  -- hives developing on arms  -- no Abxs given and no recent Abxs  -- Benadryl prn  -- montior    # Pulmonary/ PE  -- check D dimer  -- LE dopplers  -- possibly CTA or CT of chest     # F/E/N  -- gently hydrate  -- check and max lytes  -regular diet    DVT prophylaxis- lovenox/ SCD    FULL CODE    Status/Rationale:  obeservation  Signed by: Earnest Rosier, MD   JX:[id number], Cyndra Numbers, MD

## 2018-04-15 NOTE — Progress Notes (Addendum)
NURSING PROGRESS NOTE    Patient Name: Chelsea Montoya (62 y.o. female)  Admission Date: 04/15/2018 [facility] Day 0)    Shift Note: Patient admitted from ED to rm 744 @ 1700. 4 eyes in 4 hrs completed w/ Rayfield Citizen, Charity fundraiser. Pt oriented to room and unit. Patient A&Ox4, MAE- RUE < LUE, sensation decreased in RUE at baseline, VSS, on RA, FC, on tele. C/o pain in back- IV dilaudid given 1x w/ some relief. MRI spine questionnaires completed- Ativan ordered. Paged MD to modify order and dose.  External cath in place. Fall precautions in place. Hourly rounding completed.      Recent Labs   Lab 04/15/18  1001   Sodium 137   Potassium 4.4   Chloride 103   CO2 26   BUN 5.0*   Creatinine 0.6   EGFR >60.0   Glucose 111*   Calcium 9.2       Recent Labs   Lab 04/15/18  1001   WBC 11.05*   Hgb 13.6   Hematocrit 41.6   Platelets 265         Patient Lines/Drains/Airways Status    Active Lines, Drains and Airways     Name:   Placement date:   Placement time:   Site:   Days:    PICC Single Lumen 01/04/18 Right Brachial   01/04/18    1830    Brachial   100    Peripheral IV 04/15/18 Left Hand   04/15/18    1015    Hand   less than 1    Urethral Catheter 16 Fr.   01/02/18    1822    -   102                MEWS Score: 1    Last BM: PTA  Pending Orders: MRI cervical/thoracic; doppler BLE  Discharge Plan: TBD    Safety Checklist    Fall Precautions Y    Avasys N    Seizure Precautions N    Aspiration Precautions N       Interpreter Services:  Does the patient require an Interpreter? N    If yes, what form of interpreter services was used? NA     If family was utilized, is interpreter waiver form signed and in the chart? NA

## 2018-04-16 ENCOUNTER — Inpatient Hospital Stay: Payer: BLUE CROSS/BLUE SHIELD

## 2018-04-16 DIAGNOSIS — R9431 Abnormal electrocardiogram [ECG] [EKG]: Secondary | ICD-10-CM

## 2018-04-16 DIAGNOSIS — I4581 Long QT syndrome: Secondary | ICD-10-CM

## 2018-04-16 LAB — ECG 12-LEAD
Atrial Rate: 76 {beats}/min
P Axis: 36 degrees
P-R Interval: 180 ms
Q-T Interval: 426 ms
QRS Duration: 82 ms
QTC Calculation (Bezet): 479 ms
R Axis: 36 degrees
T Axis: -24 degrees
Ventricular Rate: 76 {beats}/min

## 2018-04-16 LAB — GFR: EGFR: >60

## 2018-04-16 LAB — CBC AND DIFFERENTIAL
Absolute NRBC: 0 10*3/uL (ref 0.00–0.00)
Basophils Absolute Automated: 0.05 10*3/uL (ref 0.00–0.08)
Basophils Automated: 0.4 %
Eosinophils Absolute Automated: 0.18 10*3/uL (ref 0.00–0.44)
Eosinophils Automated: 1.6 %
Hematocrit: 38.9 % (ref 34.7–43.7)
Hgb: 12.3 g/dL (ref 11.4–14.8)
Immature Granulocytes Absolute: 0.04 10*3/uL (ref 0.00–0.07)
Immature Granulocytes: 0.4 %
Lymphocytes Absolute Automated: 2.14 10*3/uL (ref 0.42–3.22)
Lymphocytes Automated: 19.2 %
MCH: 27.8 pg (ref 25.1–33.5)
MCHC: 31.6 g/dL (ref 31.5–35.8)
MCV: 88 fL (ref 78.0–96.0)
MPV: 9.3 fL (ref 8.9–12.5)
Monocytes Absolute Automated: 0.83 10*3/uL (ref 0.21–0.85)
Monocytes: 7.4 %
Neutrophils Absolute: 7.92 10*3/uL — ABNORMAL HIGH (ref 1.10–6.33)
Neutrophils: 71 %
Nucleated RBC: 0 /100 WBC (ref 0.0–0.0)
Platelets: 246 10*3/uL (ref 142–346)
RBC: 4.42 10*6/uL (ref 3.90–5.10)
RDW: 15 % (ref 11–15)
WBC: 11.16 10*3/uL — ABNORMAL HIGH (ref 3.10–9.50)

## 2018-04-16 LAB — BASIC METABOLIC PANEL
BUN: 5 mg/dL — ABNORMAL LOW (ref 7.0–19.0)
CO2: 25 mEq/L (ref 22–29)
Calcium: 8.7 mg/dL (ref 8.5–10.5)
Chloride: 101 mEq/L (ref 100–111)
Creatinine: 0.6 mg/dL (ref 0.6–1.0)
Glucose: 103 mg/dL — ABNORMAL HIGH (ref 70–100)
Potassium: 4 mEq/L (ref 3.5–5.1)
Sodium: 137 mEq/L (ref 136–145)

## 2018-04-16 MED ORDER — GABAPENTIN 100 MG PO CAPS
200.00 mg | ORAL_CAPSULE | Freq: Three times a day (TID) | ORAL | Status: DC
Start: 2018-04-16 — End: 2018-04-18
  Administered 2018-04-16 – 2018-04-18 (×7): 200 mg via ORAL
  Filled 2018-04-16 (×7): qty 2

## 2018-04-16 MED ORDER — VALACYCLOVIR HCL 500 MG PO TABS
1000.00 mg | ORAL_TABLET | Freq: Three times a day (TID) | ORAL | Status: DC
Start: 2018-04-16 — End: 2018-04-19
  Administered 2018-04-16 – 2018-04-19 (×10): 1000 mg via ORAL
  Filled 2018-04-16 (×12): qty 2

## 2018-04-16 MED ORDER — HYDROMORPHONE HCL 4 MG PO TABS
4.00 mg | ORAL_TABLET | ORAL | Status: DC | PRN
Start: 2018-04-16 — End: 2018-04-17
  Administered 2018-04-16 – 2018-04-17 (×4): 4 mg via ORAL
  Filled 2018-04-16 (×4): qty 1

## 2018-04-16 NOTE — UM Notes (Signed)
In-pt Status: 04/15/18 at 1226    04/15/18: 62 y/o female with a a pmhz of Crohns dz, HTN and a h/o cervical myelopathy with disc herniation of C3-C7 who is s/p anterior cervical diskcetomy and fusion on  7/3 who presents with left posterior shoulder blade rash and pain * 1 week--  Patient defines the pain as sharp and also " bone pain"-- 10/10. Its located at the inferior portion of the left scapular and mid vertebral area and at times on the right shoulder as well. She denies and SOB or palpitations.  She states she had some pain when visiting [facility] with her family and usually she can take an oxycodone to control but this pain is significantly worse with NO relief with 2 oxycodne which is very rare. Since the pain became more intense she decided to come to the ER. No trauma to the area.  She has been undergoing Occupational therapy since surgery and is very happy with how strong her right arm has gotten. Prior to the onset of this pain she states that she was doing well and felt pretty good.  She has Crohns but is on no prednisone or immunosuppressants.   98, 120/91, 16, 98.9  SpO2 97%  Wbc 11.05, glucose 111, BUN 5.0, sed rate 48, c-reactive protein 5.5  CT c-spine: Surgical changes related to an ACDF spanning C3-C7 which is stable in the study interval.  2. Stable degenerative changes of the cervical spine.  3. The previously seen prevertebral collection in the cervical region no longer identified.  4. Thoracic degenerative changes similar to the prior MR when allowing for differences in technique.  Morphine IV, Zofran IV< Dilaudid IV in the ER    Episode Care Day #1:  Admit to neuro floor  NS 100 cc/h  Infectious diseases consult  Orthopedic surgery consult  Regular diet  U/s venous duplex doppler BLE  Tele monitoring   Dilaudid IV x 1  Plan per MD: ? Infection at previous Cervical operative site  -- Dr. Kizzie Bane - [facility] will see and contacted by ER  -- he requests- MRI and CT scan of cervical and  thoracici spine w/o contrast  -- elevated inflammatory markers--- trend  -- ID consult in AM ( PLEASE CALL IN AM)   -- no Abxs at this time  -- if fever/chills-- PAN CULTURE  Shingles vs musculoskeletal/ costochondritis  --contact precautions  -- consider Valtrex empirically  --monitor area of skin frequently  -- consider toradol/ steroids for pain- inflammation  -- check Varicella titiers  --occupational therpay  Pain control  - patient with severe pain- not relieved with Morphine (per patient she takes oxycodone very infrequently so this level of pain is rare)  -- w/u as per above  Pulmonary/ PE  -- check D dimer  -- LE dopplers  -- possibly CTA or CT of chest         Bobette Mo. Karel Jarvis, RN BSN  Utilization Review  [employer]  Tel: [phone]

## 2018-04-16 NOTE — Consults (Signed)
ORTHO CONSULT NOTE    Date Time: 04/16/2018 10:57 AM    Patient Name: Chelsea Montoya        Subjective:   Feels well x midback feels like it is on fire. No CP/SOB/N/V/D.  Swallowing and speaking well.  Ambulating. Pain well controlled.       Review of Systems:   A comprehensive review of systems was: General ROS: negative for - chills or fever Neuro: No new neurologic issues.      Medications:     Current Facility-Administered Medications   Medication Dose Route Frequency Provider Last Rate Last Dose   . 0.9%  NaCl infusion   Intravenous Continuous Pratt-McCoy, Kia C, MD 100 mL/hr at 04/16/18 0324     . albuterol (PROVENTIL) (2.5 MG/3ML) 0.083% nebulizer solution 2.5 mg  2.5 mg Nebulization Q6H PRN Pratt-McCoy, Kia C, MD       . ALPRAZolam (XANAX) tablet 0.5 mg  0.5 mg Oral QHS PRN Pratt-McCoy, Kia C, MD       . buPROPion XL (WELLBUTRIN XL) 24 hr tablet 300 mg  300 mg Oral Daily Pratt-McCoy, Kia C, MD   300 mg at 04/16/18 0831   . diphenhydrAMINE (BENADRYL) injection 6.25 mg  6.25 mg Intravenous Q6H PRN Pratt-McCoy, Kia C, MD       . enoxaparin (LOVENOX) syringe 40 mg  40 mg Subcutaneous Daily Pratt-McCoy, Kia C, MD   40 mg at 04/16/18 0835   . fluticasone furoate-vilanterol (BREO ELLIPTA) 100-25 MCG/INH 1 puff  1 puff Inhalation QAM Pratt-McCoy, Kia C, MD   1 puff at 04/16/18 0958   . HYDROmorphone (DILAUDID) injection 0.4 mg  0.4 mg Intravenous Q8H PRN Pratt-McCoy, Kia C, MD   0.4 mg at 04/16/18 0831   . lidocaine (LIDODERM) 5 % 1 patch  1 patch Transdermal Q24H Pratt-McCoy, Kia C, MD   1 patch at 04/15/18 2121   . melatonin tablet 6 mg  6 mg Oral QHS Pratt-McCoy, Kia C, MD   6 mg at 04/15/18 2332   . naloxone (NARCAN) injection 0.2 mg  0.2 mg Intravenous PRN Pratt-McCoy, Kia C, MD       . ondansetron (ZOFRAN-ODT) disintegrating tablet 4 mg  4 mg Oral Q6H PRN Pratt-McCoy, Kia C, MD        Or   . ondansetron (ZOFRAN) injection 4 mg  4 mg Intravenous Q6H PRN Pratt-McCoy, Kia C, MD   4 mg at 04/16/18 0029   .  oxyCODONE (ROXICODONE) immediate release tablet 10 mg  10 mg Oral Q6H PRN Pratt-McCoy, Kia C, MD   10 mg at 04/16/18 0327   . sertraline (ZOLOFT) tablet 25 mg  25 mg Oral Daily Pratt-McCoy, Kia C, MD   25 mg at 04/15/18 2000   . valACYclovir HCL (VALTREX) tablet 1,000 mg  1,000 mg Oral TID Claiborne Rigg, MD         Facility-Administered Medications Ordered in Other Encounters   Medication Dose Route Frequency Provider Last Rate Last Dose   . glucagon (rDNA) (GLUCAGEN) injection 1 mg  1 mg Intravenous Once Halina Andreas, MD             Labs:     Lab Results   Component Value Date    HGB 12.3 04/16/2018    HGB 13.3 03/16/2014    HCT 38.9 04/16/2018    WBC 11.16 (H) 04/16/2018    WBC NONE SEEN 10/04/2015    PLT 246 04/16/2018  PLT 300 03/16/2014       Radiology:  Radiology Results (24 Hour)     Procedure Component Value Units Date/Time    MRI Cervical Spine WO Contrast [[id number]] Collected:  04/16/18 0914    Order Status:  Completed Updated:  04/16/18 0939    Narrative:       HISTORY: Neck pain, prior surgery.    TECHNIQUE: Multisequence multiplanar MR imaging of the cervical and  thoracic spine without contrast.        COMPARISON: 01/01/2018 cervical and thoracic spine MRI.    FINDINGS:  Again there are postsurgical changes related to anterior fusion  extending from the C3 through C7 vertebral bodies. Artifact from  hardware slightly limits evaluation of directly adjacent structures.  There has been interval resolution of the previously seen prevertebral  collection the cervical levels. No new paraspinal or intraspinal  collection.   No new compression fracture or acute malalignment of the cervical or  lumbar levels. No new destructive appearing osseous lesion.  There is unchanged extent of residual, moderate spinal canal narrowing  and severe left greater than right neural foraminal narrowing at the  C5-C6 level. Again there are moderate thoracic degenerative changes,  with predominantly chronic appearing endplate  changes and multilevel  disc bulging/protrusions resulting in up to moderate spinal canal  narrowing at the T7-T8 level (image 33, series 8). This may be mildly  increased from prior exam. No new focal stenosis. There are suspected  mild inflammatory changes involving the bilateral facet joints at the  C7-T1 level, right greater than left (image 9, series 6). No new focal  spinal cord signal abnormality is identified in the cervical or thoracic  spine.  Redemonstrated degenerative changes of the upper lumbar spine, with  probable increased degenerative marrow edema signal at the L1-L2 level  secondary to asymmetric degenerative disc disease. No appreciable change  in associated spinal canal narrowing.  Imaged posterior fossa structures show no acute abnormality.      Impression:         1. Interval resolution of the previously seen cervical prevertebral  collection. No new paraspinal or intraspinal collection.  2. No significant new or acute abnormality of the cervicothoracic spine.  3. Multilevel degenerative changes as described, possibly mildly  progressed from prior exam, with probable active inflammatory changes  along the C7-T1 facet joints, right greater than left, and degenerative  endplate marrow edema at the L1-L2 level.    Susy Frizzle, MD   04/16/2018 9:35 AM    MRI Thoracic Spine WO Contrast [[id number]] Collected:  04/16/18 0914    Order Status:  Completed Updated:  04/16/18 0939    Narrative:       HISTORY: Neck pain, prior surgery.    TECHNIQUE: Multisequence multiplanar MR imaging of the cervical and  thoracic spine without contrast.        COMPARISON: 01/01/2018 cervical and thoracic spine MRI.    FINDINGS:  Again there are postsurgical changes related to anterior fusion  extending from the C3 through C7 vertebral bodies. Artifact from  hardware slightly limits evaluation of directly adjacent structures.  There has been interval resolution of the previously seen prevertebral  collection the  cervical levels. No new paraspinal or intraspinal  collection.   No new compression fracture or acute malalignment of the cervical or  lumbar levels. No new destructive appearing osseous lesion.  There is unchanged extent of residual, moderate spinal canal narrowing  and severe left greater than right neural  foraminal narrowing at the  C5-C6 level. Again there are moderate thoracic degenerative changes,  with predominantly chronic appearing endplate changes and multilevel  disc bulging/protrusions resulting in up to moderate spinal canal  narrowing at the T7-T8 level (image 33, series 8). This may be mildly  increased from prior exam. No new focal stenosis. There are suspected  mild inflammatory changes involving the bilateral facet joints at the  C7-T1 level, right greater than left (image 9, series 6). No new focal  spinal cord signal abnormality is identified in the cervical or thoracic  spine.  Redemonstrated degenerative changes of the upper lumbar spine, with  probable increased degenerative marrow edema signal at the L1-L2 level  secondary to asymmetric degenerative disc disease. No appreciable change  in associated spinal canal narrowing.  Imaged posterior fossa structures show no acute abnormality.      Impression:         1. Interval resolution of the previously seen cervical prevertebral  collection. No new paraspinal or intraspinal collection.  2. No significant new or acute abnormality of the cervicothoracic spine.  3. Multilevel degenerative changes as described, possibly mildly  progressed from prior exam, with probable active inflammatory changes  along the C7-T1 facet joints, right greater than left, and degenerative  endplate marrow edema at the L1-L2 level.    Susy Frizzle, MD   04/16/2018 9:35 AM    CT Angiogram Chest [[id number]] Resulted:  04/16/18 0117    Order Status:  Sent Updated:  04/16/18 0140    CT Cervical Spine WO Contrast [[id number]] Collected:  04/15/18 1719    Order Status:   Completed Updated:  04/15/18 1757    Narrative:       HISTORY: Cervical fusion July, 2019. Neck and back pain.    COMPARISON: MR imaging of the cervical and thoracic spine 01/01/2018, CT  imaging of the cervical spine 01/01/2018    TECHNIQUE: Noncontrast CT imaging of the cervical and thoracic spine  were performed in the axial plane. Sagittal and coronal reformatted  images were provided.    The following dose reduction techniques were utilized: automated  exposure control and/or adjustment of the mA and/or kV according to  patient size, and the use of iterative reconstruction technique.      FINDINGS: Again seen are surgical changes related to an ACDF C3-C7.  Surgical hardware appears to be intact as are the interbody prostheses.  No cervical fracture, loss of height, malalignment or prevertebral soft  tissue thickening is seen. There are degenerative changes.    The thoracic vertebral bodies maintain their height and alignment. There  is a Schmorl's nodule of the superior endplate of T12. There is diffuse  degenerative change with disc space during, osteophytic ridges, facet  arthropathy and ligamentous hypertrophy.      Impression:         1. Surgical changes related to an ACDF spanning C3-C7 which is stable in  the study interval.  2. Stable degenerative changes of the cervical spine.  3. The previously seen prevertebral collection in the cervical region no  longer identified.  4. Thoracic degenerative changes similar to the prior MR when allowing  for differences in technique.    Otho Ket, MD   04/15/2018 5:53 PM    CT Thoracic Spine WO Contrast [[id number]] Collected:  04/15/18 1719    Order Status:  Completed Updated:  04/15/18 1757    Narrative:       HISTORY: Cervical fusion July, 2019. Neck  and back pain.    COMPARISON: MR imaging of the cervical and thoracic spine 01/01/2018, CT  imaging of the cervical spine 01/01/2018    TECHNIQUE: Noncontrast CT imaging of the cervical and thoracic spine  were  performed in the axial plane. Sagittal and coronal reformatted  images were provided.    The following dose reduction techniques were utilized: automated  exposure control and/or adjustment of the mA and/or kV according to  patient size, and the use of iterative reconstruction technique.      FINDINGS: Again seen are surgical changes related to an ACDF C3-C7.  Surgical hardware appears to be intact as are the interbody prostheses.  No cervical fracture, loss of height, malalignment or prevertebral soft  tissue thickening is seen. There are degenerative changes.    The thoracic vertebral bodies maintain their height and alignment. There  is a Schmorl's nodule of the superior endplate of T12. There is diffuse  degenerative change with disc space during, osteophytic ridges, facet  arthropathy and ligamentous hypertrophy.      Impression:         1. Surgical changes related to an ACDF spanning C3-C7 which is stable in  the study interval.  2. Stable degenerative changes of the cervical spine.  3. The previously seen prevertebral collection in the cervical region no  longer identified.  4. Thoracic degenerative changes similar to the prior MR when allowing  for differences in technique.    Otho Ket, MD   04/15/2018 5:53 PM           Physical Exam:     Patient Vitals for the past 24 hrs:   BP Temp Temp src Pulse Resp SpO2 Height Weight   04/16/18 0735 119/76 97.3 F (36.3 C) Oral 63 18 97 % - -   04/16/18 0405 121/61 97.9 F (36.6 C) Oral 82 20 96 % - -   04/16/18 0059 - - - - - 94 % - -   04/16/18 0043 - - - - - (!) 88 % - -   04/15/18 2348 136/75 99.7 F (37.6 C) Oral 92 22 90 % - -   04/15/18 1843 131/61 99.8 F (37.7 C) Oral 91 21 90 % - -   04/15/18 1730 - - - - - - 1.524 m (5') 82.1 kg (181 lb)   04/15/18 1658 138/70 99.5 F (37.5 C) Oral 86 16 93 % - -   04/15/18 1601 133/64 98.6 F (37 C) Oral 81 16 93 % - -   04/15/18 1414 - 98.8 F (37.1 C) Oral - - - - -   04/15/18 1400 123/60 - - 80 - - - -    04/15/18 1350 - - - - - 94 % - -   04/15/18 1233 - - - - 16 - - -   04/15/18 1230 132/76 - - 85 - - - -   04/15/18 1223 - - - - - 94 % - -   04/15/18 1206 - - - - - 94 % - -   04/15/18 1200 122/61 - - 82 - - - -   04/15/18 1112 121/66 98.4 F (36.9 C) Oral 82 16 93 % - -     No intake/output data recorded.      Abd soft. Calves NT. No erythema or swelling. Negative Homan's sign.  Neck:  soft  Neuro: Exam stable c/w preop unless noted otherwise.  C5: Right: Motor:     Sensation:         Biceps reflex:                  Left:   Motor:      Sensation:         Biceps reflex:           C6: Right: Motor:      Sensation:         Brachioradialis reflex:                  Left:   Motor:      Sensation:         Brachioradialis reflex:           C7: Right: Motor:      Sensation:         Triceps reflex:                  Left:   Motor:      Sensation:         Triceps reflex:           C8: Right: Motor:      Sensation:                             Left:   Motor:      Sensation:                        T1: Right: Motor:      Sensation:                             Left:   Motor:      Sensation:         Assessment:   ? Shingles.    Plan:   Ambulate, pain control, call for any new problems.    Signed by: Kizzie Ide  04/16/2018  10:57 AM

## 2018-04-16 NOTE — Plan of Care (Signed)
Problem: Pain  Goal: Pain at adequate level as identified by patient  Outcome: Not Progressing  Flowsheets (Taken 04/16/2018 0441)  Pain at adequate level as identified by patient: Identify patient comfort function goal; Assess for risk of opioid induced respiratory depression, including snoring/sleep apnea. Alert healthcare team of risk factors identified.; Assess pain on admission, during daily assessment and/or before any "as needed" intervention(s); Reassess pain within 30-60 minutes of any procedure/intervention, per Pain Assessment, Intervention, Reassessment (AIR) Cycle; Evaluate if patient comfort function goal is met; Evaluate patient's satisfaction with pain management progress; Offer non-pharmacological pain management interventions; Include patient/patient care companion in decisions related to pain management as needed     Problem: Safety  Goal: Patient will be free from injury during hospitalization  Outcome: Progressing  Flowsheets (Taken 04/16/2018 0441)  Patient will be free from injury during hospitalization : Assess patient's risk for falls and implement fall prevention plan of care per policy; Use appropriate transfer methods; Provide and maintain safe environment; Ensure appropriate safety devices are available at the bedside; Include patient/ family/ care giver in decisions related to safety; Hourly rounding

## 2018-04-16 NOTE — Progress Notes (Signed)
[facility] HOSPITALIST PROGRESS NOTE    Date Time: 04/16/18 5:35 PM  Patient Name: Chelsea Montoya  Attending Physician: Andrew Au, MD      History of Presenting Illness and Interval History/24 hour Events:   This is a 62 y/o female with a a pmhz of Crohns dz, HTN and a h/o cervical myelopathy with disc herniation of C3-C7 who is s/p anterior cervical diskcetomy and fusion on  7/3 who presents with left posterior shoulder blade rash and pain * 1 week--    Patient defines the pain as sharp and also " bone pain"-- 10/10. Its located at the inferior portion of the left scapular and mid vertebral area and at times on the right shoulder as well. She denies and SOB or palpitations.  She states she had some pain when visiting [facility] with her family and usually she can take an oxycodone to control but this pain is significantly worse with NO relief with 2 oxycodne which is very rare. Since the pain became more intense she decided to come to the ER.    She denies any fever or chills. No trauma to the area.SHe denies cough or sick contacts.  She has been undergoing Occupational therapy since surgery and is very happy with how strong her right arm has gotten. Prior to the onset of this pain she states that she was doing well and felt pretty good.    She has Crohns but is on no prednisone or immunosuppressants. She has never had the chicken pox, no shingles and cannot recall receiving the Varicella vaccine.  10/22: several attempts to place 18 degrees IV line was unsuccessful.  CTA could not be done because of that.  I am patient denies any chest pain or shortness of breath-however, she has been frequently desaturating to upper 80s..  She complains of some cough.Complains of severe pain with minimal activity       Physical Exam:     Vitals:    04/16/18 1600   BP: 106/55   Pulse: 79   Resp: 18   Temp: 99.5 F (37.5 C)   SpO2: 98%       Intake and Output Summary (Last 24 hours) at Date Time  No intake or output data in  the 24 hours ending 04/16/18 1735    General: awake, alert, oriented x 3; no acute distress.  HEENT: perrla, eomi, sclera anicteric  oropharynx clear without lesions, mucous membranes moist  Neck: supple, no lymphadenopathy, no thyromegaly, no JVD, no carotid bruits  Cardiovascular: regular rate and rhythm, no murmurs, rubs or gallops  Lungs: clear to auscultation bilaterally, without wheezing, rhonchi, or rales  Abdomen: soft, non-tender, non-distended; no palpable masses, no hepatosplenomegaly, normoactive bowel sounds, no rebound or guarding  Extremities: no clubbing, cyanosis, or edema  Neuro: cranial nerves grossly intact, strength 5/5 in upper and lower extremities, sensation intact,   Skin: small blisters at the back.      Medications:     Current Facility-Administered Medications   Medication Dose Route Frequency   . buPROPion XL  300 mg Oral Daily   . enoxaparin  40 mg Subcutaneous Daily   . fluticasone furoate-vilanterol  1 puff Inhalation QAM   . gabapentin  200 mg Oral Q8H SCH   . lidocaine  1 patch Transdermal Q24H   . melatonin  6 mg Oral QHS   . sertraline  25 mg Oral Daily   . valACYclovir HCL  1,000 mg Oral TID  Labs:     Results     Procedure Component Value Units Date/Time    Basic Metabolic Panel [[id number]]  (Abnormal) Collected:  04/16/18 0448    Specimen:  Blood Updated:  04/16/18 0620     Glucose 103 mg/dL      BUN 5.0 mg/dL      Creatinine 0.6 mg/dL      Calcium 8.7 mg/dL      Sodium 536 mEq/L      Potassium 4.0 mEq/L      Chloride 101 mEq/L      CO2 25 mEq/L     GFR [[id number]] Collected:  04/16/18 0448     Updated:  04/16/18 0620     EGFR >60.0    CBC with differential [[id number]]  (Abnormal) Collected:  04/16/18 0449    Specimen:  Blood Updated:  04/16/18 0541     WBC 11.16 x10 3/uL      Hgb 12.3 g/dL      Hematocrit 43.3 %      Platelets 246 x10 3/uL      RBC 4.42 x10 6/uL      MCV 88.0 fL      MCH 27.8 pg      MCHC 31.6 g/dL      RDW 15 %      MPV 9.3 fL      Neutrophils 71.0 %       Lymphocytes Automated 19.2 %      Monocytes 7.4 %      Eosinophils Automated 1.6 %      Basophils Automated 0.4 %      Immature Granulocyte 0.4 %      Nucleated RBC 0.0 /100 WBC      Neutrophils Absolute 7.92 x10 3/uL      Abs Lymph Automated 2.14 x10 3/uL      Abs Mono Automated 0.83 x10 3/uL      Abs Eos Automated 0.18 x10 3/uL      Absolute Baso Automated 0.05 x10 3/uL      Absolute Immature Granulocyte 0.04 x10 3/uL      Absolute NRBC 0.00 x10 3/uL     Varicella Zoster (VZV) antibody, IgM [[id number]] Collected:  04/16/18 0033    Specimen:  Blood Updated:  04/16/18 0100            Radiology:     Radiology Results (24 Hour)     Procedure Component Value Units Date/Time    US Venous Duplex Doppler Leg Bilateral [[id number]] Collected:  04/16/18 1458    Order Status:  Completed Updated:  04/16/18 1503    Narrative:       INDICATION: Calf pain.    PROCEDURE: Ultrasound of the deep venous systems of both lower  extremities.    FINDINGS: The deep venous systems of the lower extremities were imaged  from the groins to the popliteal fossae. The veins were compressible at  all levels. Normal waveforms were seen with normal respiratory variation  of blood flow and normal augmentation of blood flow with calf  compression. Visualized calf veins were completely compressible.      Impression:        No evidence of lower extremity venous thrombosis.    Wynema Birch, MD   04/16/2018 2:58 PM    MRI Cervical Spine WO Contrast [[id number]] Collected:  04/16/18 0914    Order Status:  Completed Updated:  04/16/18 0939    Narrative:       HISTORY: Neck  pain, prior surgery.    TECHNIQUE: Multisequence multiplanar MR imaging of the cervical and  thoracic spine without contrast.        COMPARISON: 01/01/2018 cervical and thoracic spine MRI.    FINDINGS:  Again there are postsurgical changes related to anterior fusion  extending from the C3 through C7 vertebral bodies. Artifact from  hardware slightly limits evaluation of directly  adjacent structures.  There has been interval resolution of the previously seen prevertebral  collection the cervical levels. No new paraspinal or intraspinal  collection.   No new compression fracture or acute malalignment of the cervical or  lumbar levels. No new destructive appearing osseous lesion.  There is unchanged extent of residual, moderate spinal canal narrowing  and severe left greater than right neural foraminal narrowing at the  C5-C6 level. Again there are moderate thoracic degenerative changes,  with predominantly chronic appearing endplate changes and multilevel  disc bulging/protrusions resulting in up to moderate spinal canal  narrowing at the T7-T8 level (image 33, series 8). This may be mildly  increased from prior exam. No new focal stenosis. There are suspected  mild inflammatory changes involving the bilateral facet joints at the  C7-T1 level, right greater than left (image 9, series 6). No new focal  spinal cord signal abnormality is identified in the cervical or thoracic  spine.  Redemonstrated degenerative changes of the upper lumbar spine, with  probable increased degenerative marrow edema signal at the L1-L2 level  secondary to asymmetric degenerative disc disease. No appreciable change  in associated spinal canal narrowing.  Imaged posterior fossa structures show no acute abnormality.      Impression:         1. Interval resolution of the previously seen cervical prevertebral  collection. No new paraspinal or intraspinal collection.  2. No significant new or acute abnormality of the cervicothoracic spine.  3. Multilevel degenerative changes as described, possibly mildly  progressed from prior exam, with probable active inflammatory changes  along the C7-T1 facet joints, right greater than left, and degenerative  endplate marrow edema at the L1-L2 level.    Susy Frizzle, MD   04/16/2018 9:35 AM    MRI Thoracic Spine WO Contrast [[id number]] Collected:  04/16/18 0914    Order Status:   Completed Updated:  04/16/18 0939    Narrative:       HISTORY: Neck pain, prior surgery.    TECHNIQUE: Multisequence multiplanar MR imaging of the cervical and  thoracic spine without contrast.        COMPARISON: 01/01/2018 cervical and thoracic spine MRI.    FINDINGS:  Again there are postsurgical changes related to anterior fusion  extending from the C3 through C7 vertebral bodies. Artifact from  hardware slightly limits evaluation of directly adjacent structures.  There has been interval resolution of the previously seen prevertebral  collection the cervical levels. No new paraspinal or intraspinal  collection.   No new compression fracture or acute malalignment of the cervical or  lumbar levels. No new destructive appearing osseous lesion.  There is unchanged extent of residual, moderate spinal canal narrowing  and severe left greater than right neural foraminal narrowing at the  C5-C6 level. Again there are moderate thoracic degenerative changes,  with predominantly chronic appearing endplate changes and multilevel  disc bulging/protrusions resulting in up to moderate spinal canal  narrowing at the T7-T8 level (image 33, series 8). This may be mildly  increased from prior exam. No new focal stenosis. There are suspected  mild inflammatory changes involving the bilateral facet joints at the  C7-T1 level, right greater than left (image 9, series 6). No new focal  spinal cord signal abnormality is identified in the cervical or thoracic  spine.  Redemonstrated degenerative changes of the upper lumbar spine, with  probable increased degenerative marrow edema signal at the L1-L2 level  secondary to asymmetric degenerative disc disease. No appreciable change  in associated spinal canal narrowing.  Imaged posterior fossa structures show no acute abnormality.      Impression:         1. Interval resolution of the previously seen cervical prevertebral  collection. No new paraspinal or intraspinal collection.  2. No  significant new or acute abnormality of the cervicothoracic spine.  3. Multilevel degenerative changes as described, possibly mildly  progressed from prior exam, with probable active inflammatory changes  along the C7-T1 facet joints, right greater than left, and degenerative  endplate marrow edema at the L1-L2 level.    Susy Frizzle, MD   04/16/2018 9:35 AM    CT Cervical Spine WO Contrast [[id number]] Collected:  04/15/18 1719    Order Status:  Completed Updated:  04/15/18 1757    Narrative:       HISTORY: Cervical fusion July, 2019. Neck and back pain.    COMPARISON: MR imaging of the cervical and thoracic spine 01/01/2018, CT  imaging of the cervical spine 01/01/2018    TECHNIQUE: Noncontrast CT imaging of the cervical and thoracic spine  were performed in the axial plane. Sagittal and coronal reformatted  images were provided.    The following dose reduction techniques were utilized: automated  exposure control and/or adjustment of the mA and/or kV according to  patient size, and the use of iterative reconstruction technique.      FINDINGS: Again seen are surgical changes related to an ACDF C3-C7.  Surgical hardware appears to be intact as are the interbody prostheses.  No cervical fracture, loss of height, malalignment or prevertebral soft  tissue thickening is seen. There are degenerative changes.    The thoracic vertebral bodies maintain their height and alignment. There  is a Schmorl's nodule of the superior endplate of T12. There is diffuse  degenerative change with disc space during, osteophytic ridges, facet  arthropathy and ligamentous hypertrophy.      Impression:         1. Surgical changes related to an ACDF spanning C3-C7 which is stable in  the study interval.  2. Stable degenerative changes of the cervical spine.  3. The previously seen prevertebral collection in the cervical region no  longer identified.  4. Thoracic degenerative changes similar to the prior MR when allowing  for differences in  technique.    Otho Ket, MD   04/15/2018 5:53 PM    CT Thoracic Spine WO Contrast [[id number]] Collected:  04/15/18 1719    Order Status:  Completed Updated:  04/15/18 1757    Narrative:       HISTORY: Cervical fusion July, 2019. Neck and back pain.    COMPARISON: MR imaging of the cervical and thoracic spine 01/01/2018, CT  imaging of the cervical spine 01/01/2018    TECHNIQUE: Noncontrast CT imaging of the cervical and thoracic spine  were performed in the axial plane. Sagittal and coronal reformatted  images were provided.    The following dose reduction techniques were utilized: automated  exposure control and/or adjustment of the mA and/or kV according to  patient size, and the use  of iterative reconstruction technique.      FINDINGS: Again seen are surgical changes related to an ACDF C3-C7.  Surgical hardware appears to be intact as are the interbody prostheses.  No cervical fracture, loss of height, malalignment or prevertebral soft  tissue thickening is seen. There are degenerative changes.    The thoracic vertebral bodies maintain their height and alignment. There  is a Schmorl's nodule of the superior endplate of T12. There is diffuse  degenerative change with disc space during, osteophytic ridges, facet  arthropathy and ligamentous hypertrophy.      Impression:         1. Surgical changes related to an ACDF spanning C3-C7 which is stable in  the study interval.  2. Stable degenerative changes of the cervical spine.  3. The previously seen prevertebral collection in the cervical region no  longer identified.  4. Thoracic degenerative changes similar to the prior MR when allowing  for differences in technique.    Otho Ket, MD   04/15/2018 5:53 PM            Assessment/plan:   # ? Infection at previous Cervical operative site  -- Dr. Kizzie Bane - [facility] will see and contacted by ER  -- MRI and CT scan of cervical and thoracici spine w/o contrast>> no new findings   -- elevated inflammatory  markers--- trend  -- no Abxs at this time  -- if fever/chills-- PAN CULTURE  -Appreciate Dr Kizzie Bane' input     # Shingles:  --contact precautions>> will d.c as only one dermatome involved per ID  --Continue valtrex   --monitor area of skin frequently  -Start Neurontin, change dilaudid to po meds, continue lidocaine patch   --occupational therpay    # Pain control  - patient with severe pain- not relieved with Morphine (per patient she takes oxycodone very infrequently so this level of pain is rare).Try po dilaudid and add neurontin   -- w/u as per above    # ?Allergic Reaction  -- hives developing on arms  -- no Abxs given and no recent Abxs  -- Benadryl prn  -- montior    # Pulmonary/rule out PE ?  --  D dimer mildly elevated   -- LE dopplers negative   --  CTA or CT of chest could not be done due to lack of good iv access.Willl get v/q scan.      DVT prophylaxis- lovenox/ SCD    FULL CODE    Anticipated discharge disposition and date:    Signed by: Andrew Au, MD   [id number], Cyndra Numbers, MD

## 2018-04-16 NOTE — Consults (Signed)
ID CONSULTATION      Spectra: [phone]    Office: [phone]     Date Time: 04/16/18 10:17 AM  Patient Name: Chelsea Montoya  Requesting Physician: Andrew Au, MD     Reason for Consultation:     Rash  Neck/back pain     Problem List:   Acute Problem List:   Neck/back pain  -- 10/22  MRI Cervical and thoracic spine - fluid collection resolved, no acute abnormality, Multilevel degenerative changes as described, possibly mildly progressed from prior exam    -- s/p 12/17/17 - Anterior/Posterior Cervical Discectomy/Fusion    Rash  -- vesicles on mid left scapula     ID History:   July 2019  Treated for Epidural abscess   -- 01/01/18 Cervical CT - progressive edema/fluid involving the operative bed involving the cervical soft tissues on the left, with progressive prevertebral soft tissue swelling diffusely  --S/P I&D 01/01/18 Cervical Wound I&D; purulence encountered    Chronic Conditions:  Crohn's  Mitral valve prolapse  Anxiety   Asthma     All: cipro (rash)  Assessment:   62 yo female, known to [place] from previous admission, coming in for rash and neck/back pain  She was treated in July for epidural abscess    Imaging shows resolution of fluid collection and no new acute abnormality    Noted to have severe pain on left scapula  Noted 2 vesicles - possible shingles    Antimicrobials:     None     Start Valtrex 1g PO TID    Recommendations:     Start Valtrex for shingles  Monitor lesions  No need for isolation as it is a single dermatome   Would consider Gabapentin for neuropathic pain    Discussed with patient   ______________________________________________________________________  I have discussed my thoughts with the patient and with relevant medical team members I have considered the potential drug interactions between antimicrobial agents   I have recommended and other medications required by the patient and adjusted appropriately for renal function   Thank you for allowing me to participate in  the care of this very interesting and pleasant patient    History:   Chelsea Montoya is a 62 y.o. female who presents to the hospital on 04/15/2018 with worsening left back pain.  She has a history of fusion surgery and was treated for an epidural abscess.  Vesicles on left scapula, concerning for shingles    Past Medical History:     Past Medical History:   Diagnosis Date   . Anemia     resolved   . Anxiety    . Arrhythmia     followed by cardio    . Arthritis     osteoarthritis   . Asthma without status asthmaticus     allergy-induced only- LD 09/2017   . Blood transfusion without reported diagnosis     14 years ago   . Complication of anesthesia     wake up fast   . Crohn's colitis 01/31/2012   . Crohn's disease     remission - colonoscopy    . Duplicated right renal collecting system    . Heart murmur     followed by cardio    . Hypertension     Runs 120/78's   . Insomnia    . Kidney stone    . Mitral valve prolapse    . Pneumonia    . Pyelonephritis    .  Seizures     Just once in 1978- stress induced    . Shortness of breath     R/T allergy   . Thyroid nodule     BILATERAL 09/2014; RT LOBE THYROID NODULE BX - BENIGN   . Vision abnormalities        Past Surgical History:     Past Surgical History:   Procedure Laterality Date   . APPENDECTOMY  2000's   . BUNIONECTOMY Right 1998   . CLOSED REDUCTION, PERCUTANEOUS FIXATION, FINGER  10/28/2012    Procedure: CLOSED REDUCTION, PERCUTANEOUS FIXATION, FINGER;  Surgeon: Arta Bruce, MD;  Location: [facility];  Service: Orthopedics;  Laterality: Left;  Percutaneous Fixation of Left Ring Finger.     . COLECTOMY  1988    colon resection   . COLONOSCOPY W/ BIOPSIES  02/2016 2019    Dr Cinda Quest - 1 POLYP, INT HEMORRHOIDS, CROHN'S IN REMISSION - RTN 2021    . ELBOW SURGERY Right 2010   . ENDOMETRIAL BIOPSY  04/22/2014    RARE SUPERFICIAL ENDOMETRIAL GLANDS AMIDST MUCUS.TISSUE IS INSUFFICIENT FOR THE OPTIMAL EVALUATION OF THE  ENDOMETRIAL CAVITY.   Lavenia Atlas, ANTERIOR  CERVICAL, LEVELS 3+ N/A 12/26/2017    Procedure: FUSION, ANTERIOR CERVICAL, LEVELS 3+;  Surgeon: Kizzie Ide, MD;  Location: [facility];  Service: Orthopedics;  Laterality: N/A;  C3-C7 ANTERIOR CERVICAL DISCECTOMY & FUSION   . GYNECOLOGIC CRYOSURGERY  1970's   . INCISION & DRAINAGE, WOUND N/A 01/01/2018    Procedure: INCISION & DRAINAGE, WOUND;  Surgeon: Kizzie Ide, MD;  Location: [place] [facility];  Service: Orthopedics;  Laterality: N/A;   . KNEE ARTHROPLASTY Right 2001, 2016   . KNEE ARTHROPLASTY Left 2009   . THYROID NODULE BIOPSY  09/2014    RT LOBE THYROID NODULE       Family History:     Family History   Problem Relation Age of Onset   . Ovarian cancer Paternal Aunt 60        died of complications of ovarian ca   . Lung cancer Mother         complications of lung ca   . Kidney failure Father    . Ovarian cancer Paternal Aunt         survivor   . Uterine cancer Maternal Grandmother 50        survivior   . Breast cancer Paternal Grandmother    . Colon cancer Neg Hx        Social History:     Social History     Socioeconomic History   . Marital status: Married     Spouse name: Not on file   . Number of children: Not on file   . Years of education: Not on file   . Highest education level: Not on file   Occupational History   . Not on file   Social Needs   . Financial resource strain: Not on file   . Food insecurity:     Worry: Not on file     Inability: Not on file   . Transportation needs:     Medical: Not on file     Non-medical: Not on file   Tobacco Use   . Smoking status: Never Smoker   . Smokeless tobacco: Never Used   Substance and Sexual Activity   . Alcohol use: Yes     Comment: occasionally   . Drug use: No   . Sexual  activity: Yes     Partners: Male     Birth control/protection: None   Lifestyle   . Physical activity:     Days per week: Not on file     Minutes per session: Not on file   . Stress: Not on file   Relationships   . Social connections:     Talks on phone: Not on file     Gets  together: Not on file     Attends religious service: Not on file     Active member of club or organization: Not on file     Attends meetings of clubs or organizations: Not on file     Relationship status: Not on file   . Intimate partner violence:     Fear of current or ex partner: Not on file     Emotionally abused: Not on file     Physically abused: Not on file     Forced sexual activity: Not on file   Other Topics Concern   . Not on file   Social History Narrative   . Not on file       Allergies:     Allergies   Allergen Reactions   . Flexeril [Cyclobenzaprine] Nausea And Vomiting     Flu like symptoms   . Darvon Other (See Comments)     Gastric distress     . Latex    . Augmentin [Amoxicillin-Pot Clavulanate] Nausea And Vomiting   . Ciprofloxacin Rash   . Keflex [Cephalexin] Other (See Comments)     Gastric upset.       Lines:     piv    Medications:     Current Facility-Administered Medications   Medication Dose Route Frequency   . buPROPion XL  300 mg Oral Daily   . enoxaparin  40 mg Subcutaneous Daily   . fluticasone furoate-vilanterol  1 puff Inhalation QAM   . lidocaine  1 patch Transdermal Q24H   . melatonin  6 mg Oral QHS   . sertraline  25 mg Oral Daily       Review of Systems:   General ROS: negative for - chills, fevers, night sweats, weight loss   HEENT: negative for - blurry vision, sore throat, thrush   Respiratory ROS: negative for cough, SOB  Cardiovascular ROS: negative for - chest pain, palpitations   Gastrointestinal ROS: negative for - abdominal pain, nausea, vomiting, diarrhea  Genito-Urinary ROS: negative for - dysuria, urinary frequency/urgency   Musculoskeletal ROS: back pain   Dermatological ROS: negative for - rash and skin lesion changes   Neurological ROS: negative for - confusion, headache, dizziness  Hematological ROS: negative for - bruising, bleeding   Psychological ROS: negative for - changes in mood      Physical Exam:     Vitals:    04/16/18 0735   BP: 119/76   Pulse: 63   Resp:  18   Temp: 97.3 F (36.3 C)   SpO2: 97%       General Appearance: alert and appropriate, non-toxic  Neuro: alert, oriented, normal speech, normal attention and cognition    HEENT: no scleral icterus,   Neck: supple,   Lungs: clear to auscultation,  Cardiac: normal rate  Abdomen: soft, non-tender  Extremities: no pedal edema  Skin: 2 vesicular lesions on lower mid left scapula     Labs:     Lab Results   Component Value Date    WBC 11.16 (H) 04/16/2018  HGB 12.3 04/16/2018    HCT 38.9 04/16/2018    MCV 88.0 04/16/2018    PLT 246 04/16/2018     Lab Results   Component Value Date    CREAT 0.6 04/16/2018     Lab Results   Component Value Date    ALT 16 01/05/2018    AST 30 01/05/2018    ALKPHOS 63 01/05/2018    BILITOTAL 0.4 01/05/2018     No results found for: LACTATE    Microbiology:     Microbiology Results     None          Rads:   Ct Cervical Spine Wo Contrast    Result Date: 04/15/2018  1. Surgical changes related to an ACDF spanning C3-C7 which is stable in the study interval. 2. Stable degenerative changes of the cervical spine. 3. The previously seen prevertebral collection in the cervical region no longer identified. 4. Thoracic degenerative changes similar to the prior MR when allowing for differences in technique. Otho Ket, MD 04/15/2018 5:53 PM    Ct Thoracic Spine Wo Contrast    Result Date: 04/15/2018  1. Surgical changes related to an ACDF spanning C3-C7 which is stable in the study interval. 2. Stable degenerative changes of the cervical spine. 3. The previously seen prevertebral collection in the cervical region no longer identified. 4. Thoracic degenerative changes similar to the prior MR when allowing for differences in technique. Otho Ket, MD 04/15/2018 5:53 PM    Mri Cervical Spine Wo Contrast    Result Date: 04/16/2018  1. Interval resolution of the previously seen cervical prevertebral collection. No new paraspinal or intraspinal collection. 2. No significant new or acute  abnormality of the cervicothoracic spine. 3. Multilevel degenerative changes as described, possibly mildly progressed from prior exam, with probable active inflammatory changes along the C7-T1 facet joints, right greater than left, and degenerative endplate marrow edema at the L1-L2 level. Susy Frizzle, MD 04/16/2018 9:35 AM    Mri Thoracic Spine Wo Contrast    Result Date: 04/16/2018  1. Interval resolution of the previously seen cervical prevertebral collection. No new paraspinal or intraspinal collection. 2. No significant new or acute abnormality of the cervicothoracic spine. 3. Multilevel degenerative changes as described, possibly mildly progressed from prior exam, with probable active inflammatory changes along the C7-T1 facet joints, right greater than left, and degenerative endplate marrow edema at the L1-L2 level. Susy Frizzle, MD 04/16/2018 9:35 AM      Signed by: Claiborne Rigg, MD

## 2018-04-16 NOTE — Progress Notes (Signed)
Patient Name: Chelsea Montoya (62 y.o. female)  Admission Date: 04/15/2018 [facility] Day 1)    Patient A&Ox4, VSS, afebrile. Denies nausea, dizziness or SOB. RUE weaker than baseline per pt. Pt de-satts to 88% on RA when sleeping, 2L nc applied as needed. MD aware. Pt has a NSR on tele. C/o pain in back- IV dilaudid and Oxycodone given. Lidocaine patch also applied to posterior upper torso. Pt OOB to the bathroom with assist. MRI completed. Pt unable to complete CT angio d/t having no 18g IV access. Tolerating teg diet, IVF infusing NS 100cc. External cath in place. Hourly rounding completed by RN and tech. Personal belongings and call bell in reach. Safety and fall precautions in place.

## 2018-04-16 NOTE — Plan of Care (Signed)
NURSING PROGRESS NOTE    Patient Name: Chelsea Montoya (62 y.o. female)  Admission Date: 04/15/2018 [facility] Day 1)    Shift Note: Pt is A/O x 4, follows command, MAE, however has limited LE movement due to back pain. Described the pain as tightness in the back. Sensation intact, denies any HA, no nausea or vomiting. Blood pressure and SATS level low when dilaudid IV is given however pt insists on IV pain medications, attending notified, Gabapentin ordered and initial dose given. Continues on 02 2 L via NC. Blister on back, started on Valtrex this shift, Contact Isolation for Shingles.     CT Angiogram not done- 18 G to be placed in the [facility], however multiple failed attempts due to poor veins, attending notified. Alternative test to be ordered per Dr Wilburt Finlay.    Recent Labs   Lab 04/16/18  0448   Sodium 137   Potassium 4.0   Chloride 101   CO2 25   BUN 5.0*   Creatinine 0.6   EGFR >60.0   Glucose 103*   Calcium 8.7       Recent Labs   Lab 04/16/18  0449   WBC 11.16*   Hgb 12.3   Hematocrit 38.9   Platelets 246         Patient Lines/Drains/Airways Status      Active Lines, Drains and Airways       Name:   Placement date:   Placement time:   Site:   Days:    Peripheral IV 04/15/18 Left Hand   04/15/18    1015    Hand   1    Peripheral IV 04/15/18 Anterior;Distal;Right Forearm   04/15/18    2340    Forearm   less than 1                    MEWS Score: 2    Last BM: 04/14/18  Pending Orders: Labs  Discharge Plan: TBD    Safety Checklist    Fall Precautions Y    Avasys N    Seizure Precautions N    Aspiration Precautions Y       Interpreter Services:  Does the patient require an Interpreter? N    If yes, what form of interpreter services was used? NA    If family was utilized, is interpreter waiver form signed and in the chart? NA  Problem: Pain  Goal: Pain at adequate level as identified by patient  Outcome: Progressing  Flowsheets (Taken 04/16/2018 1641)  Pain at adequate level as identified by patient: Assess for risk of  opioid induced respiratory depression, including snoring/sleep apnea. Alert healthcare team of risk factors identified.; Assess pain on admission, during daily assessment and/or before any "as needed" intervention(s); Reassess pain within 30-60 minutes of any procedure/intervention, per Pain Assessment, Intervention, Reassessment (AIR) Cycle; Evaluate if patient comfort function goal is met; Evaluate patient's satisfaction with pain management progress; Offer non-pharmacological pain management interventions; Consult/collaborate with Physical Therapy, Occupational Therapy, and/or Speech Therapy; Include patient/patient care companion in decisions related to pain management as needed; Consult/collaborate with Pain Service     Problem: Psychosocial and Spiritual Needs  Goal: Demonstrates ability to cope with hospitalization/illness  Outcome: Progressing  Flowsheets (Taken 04/16/2018 1641)  Demonstrates ability to cope with hospitalizations/illness: Encourage verbalization of feelings/concerns/expectations; Provide quiet environment; Assist patient to identify own strengths and abilities; Encourage patient to set small goals for self; Encourage participation in diversional activity; Reinforce positive adaptation of  new coping behaviors; Include patient/ patient care companion in decisions     Problem: Inadequate Gas Exchange  Goal: Adequate oxygenation and improved ventilation  Outcome: Progressing  Flowsheets (Taken 04/16/2018 1641)  Adequate oxygenation and improved ventilation: Assess lung sounds; Monitor SpO2 and treat as needed; Provide mechanical and oxygen support to facilitate gas exchange; Teach/reinforce use of incentive spirometer 10 times per hour while awake, cough and deep breath as needed; Plan activities to conserve energy: plan rest periods; Increase activity as tolerated/progressive mobility; Position for maximum ventilatory efficiency     Problem: Inadequate Airway Clearance  Goal: Normal respiratory  rate/effort achieved/maintained  Outcome: Progressing  Flowsheets (Taken 04/16/2018 1641)  Normal respiratory rate/effort achieved/maintained: Plan activities to conserve energy: plan rest periods     Problem: Impaired Mobility  Goal: Mobility/Activity is maintained at optimal level for patient  Outcome: Progressing  Flowsheets (Taken 04/16/2018 1641)  Mobility/activity is maintained at optimal level for patient: Increase mobility as tolerated/progressive mobility; Encourage independent activity per ability; Maintain proper body alignment; Perform active/passive ROM; Reposition patient every 2 hours and as needed unless able to reposition self; Plan activities to conserve energy, plan rest periods; Assess for changes in respiratory status, level of consciousness and/or development of fatigue; Consult/collaborate with Physical Therapy and/or Occupational Therapy

## 2018-04-16 NOTE — OT Progress Note (Signed)
[facility]   Occupational Therapy Cancellation Note      Patient:  Chelsea Montoya MRN#:  [id number]  Unit:  [facility] 7 Room/Bed:  Y782/N562.13    04/16/2018      Patient not seen for occupational therapy secondary to pt pending CTA of chest. Will follow up as appropriate.     Jiles Crocker, OTR/L pager [phone]    04/16/2018  Time:  [phone]

## 2018-04-17 ENCOUNTER — Inpatient Hospital Stay: Payer: BLUE CROSS/BLUE SHIELD

## 2018-04-17 LAB — B-TYPE NATRIURETIC PEPTIDE: B-Natriuretic Peptide: 13 pg/mL (ref 0–100)

## 2018-04-17 MED ORDER — TECHNETIUM TC 99M PENTETATE AEROSOL
35.5000 | PACK | Freq: Once | INTRAVENOUS | Status: AC | PRN
Start: 2018-04-17 — End: 2018-04-17
  Administered 2018-04-17: 10:00:00 36 via RESPIRATORY_TRACT
  Filled 2018-04-17: qty 75

## 2018-04-17 MED ORDER — HYDROMORPHONE HCL 2 MG PO TABS
2.00 mg | ORAL_TABLET | ORAL | Status: DC | PRN
Start: 2018-04-17 — End: 2018-04-18
  Administered 2018-04-17 – 2018-04-18 (×5): 2 mg via ORAL
  Filled 2018-04-17 (×5): qty 1

## 2018-04-17 MED ORDER — TECHNETIUM TC 99M ALBUMIN AGGREGATED
6.10 | Freq: Once | Status: AC | PRN
Start: 2018-04-17 — End: 2018-04-17
  Administered 2018-04-17: 10:00:00 6 via INTRAVENOUS
  Filled 2018-04-17: qty 10

## 2018-04-17 MED ORDER — FLUTICASONE PROPIONATE 50 MCG/ACT NA SUSP
1.00 | Freq: Every day | NASAL | Status: DC
Start: 2018-04-17 — End: 2018-04-19
  Administered 2018-04-17 – 2018-04-19 (×3): 1 via NASAL
  Filled 2018-04-17 (×2): qty 16

## 2018-04-17 MED ORDER — ACETAMINOPHEN 325 MG PO TABS
650.0000 mg | ORAL_TABLET | Freq: Four times a day (QID) | ORAL | Status: DC | PRN
Start: 2018-04-17 — End: 2018-04-19
  Administered 2018-04-17 (×2): 650 mg via ORAL
  Filled 2018-04-17 (×2): qty 2

## 2018-04-17 NOTE — Progress Notes (Signed)
[facility] HOSPITALIST PROGRESS NOTE    Date Time: 04/17/18 5:02 PM  Patient Name: Chelsea Montoya  Attending Physician: Andrew Au, MD      History of Presenting Illness and Interval History/24 hour Events:   This is a 62 y/o female with a a pmhz of Crohns dz, HTN and a h/o cervical myelopathy with disc herniation of C3-C7 who is s/p anterior cervical diskcetomy and fusion on  7/3 who presents with left posterior shoulder blade rash and pain * 1 week--    Patient defines the pain as sharp and also " bone pain"-- 10/10. Its located at the inferior portion of the left scapular and mid vertebral area and at times on the right shoulder as well. She denies and SOB or palpitations.  She states she had some pain when visiting [facility] with her family and usually she can take an oxycodone to control but this pain is significantly worse with NO relief with 2 oxycodne which is very rare. Since the pain became more intense she decided to come to the ER.    She denies any fever or chills. No trauma to the area.SHe denies cough or sick contacts.  She has been undergoing Occupational therapy since surgery and is very happy with how strong her right arm has gotten. Prior to the onset of this pain she states that she was doing well and felt pretty good.    She has Crohns but is on no prednisone or immunosuppressants. She has never had the chicken pox, no shingles and cannot recall receiving the Varicella vaccine.  10/22: several attempts to place 18 degrees IV line was unsuccessful.  CTA could not be done because of that.  I am patient denies any chest pain or shortness of breath-however, she has been frequently desaturating to upper 80s..  She complains of some cough.Complains of severe pain with minimal activity   10/23: Patient denies shortness of breath, chest pain- post nasal drip, ? Allergic cough.Spiked temperature this am.Still intermittently desaturating.Pain is better controlled but has been sleeping a  lot.    Physical Exam:     Vitals:    04/17/18 1645   BP:    Pulse: 87   Resp: 18   Temp:    SpO2: 94%       Intake and Output Summary (Last 24 hours) at Date Time  No intake or output data in the 24 hours ending 04/17/18 1702    General: awake, alert, oriented x 3; sleepy but easily arousable, ill appearing   HEENT: perrla, eomi, sclera anicteric  oropharynx clear without lesions, mucous membranes moist  Neck: supple, no lymphadenopathy, no thyromegaly, no JVD, no carotid bruits  Cardiovascular: regular rate and rhythm, no murmurs, rubs or gallops  Lungs: diminished breath sounds bilaterally   Abdomen: soft, non-tender, non-distended; no palpable masses, no hepatosplenomegaly, normoactive bowel sounds, no rebound or guarding  Extremities: no clubbing, cyanosis, or edema  Neuro: cranial nerves grossly intact, strength 5/5 in upper and lower extremities, sensation intact,   Skin: small blisters at the back.      Medications:     Current Facility-Administered Medications   Medication Dose Route Frequency   . buPROPion XL  300 mg Oral Daily   . enoxaparin  40 mg Subcutaneous Daily   . fluticasone  1 spray Each Nare Daily   . fluticasone furoate-vilanterol  1 puff Inhalation QAM   . gabapentin  200 mg Oral Q8H SCH   . lidocaine  1 patch  Transdermal Q24H   . melatonin  6 mg Oral QHS   . sertraline  25 mg Oral Daily   . valACYclovir HCL  1,000 mg Oral TID         Labs:     Results     Procedure Component Value Units Date/Time    B-type Natriuretic Peptide [[id number]] Collected:  04/17/18 1547    Specimen:  Blood Updated:  04/17/18 1630     B-Natriuretic Peptide 13 pg/mL             Radiology:     Radiology Results (24 Hour)     Procedure Component Value Units Date/Time    XR Chest AP Portable [[id number]] Collected:  04/17/18 1536    Order Status:  Completed Updated:  04/17/18 1541    Narrative:       Hypoxia    Comparison prior date.    Lower volume film with developing opacity in the right upper lobe. There  is also  trace fluid along the fissures. No significant effusion blunting  the costophrenic angles. Heart and mediastinal contours similar.  Postoperative changes spine      Impression:       . Developing right upper lobe opacity     Marty Heck, MD   04/17/2018 3:37 PM    NM Lung Ventilation Perfusion Aerosol [[id number]] Collected:  04/17/18 1036    Order Status:  Completed Updated:  04/17/18 1052    Narrative:       CLINICAL HISTORY: Desaturation, hypoxia.    FINDINGS: A ventilation scan was performed with 35.5 mCi of technetium  74m DTPA aerosol and a perfusion scan was performed with 6.1 mCi of Tc  74m MAA injected intravenously. Multiple matched ventilation/perfusion  images of the lungs were obtained in different projections. Correlation  is made with the chest radiograph(s) dated 04/15/2018.    Images demonstrate mild heterogeneous tracer distribution within the  lungs bilaterally on both perfusion and ventilation. There are a few  scattered small matched perfusion/ventilation defects. There is a linear  nonsegmental matched perfusion/ventilation defect within the right lung.  No unmatched segmental or subsegmental perfusion defects are identified.      Impression:        Low probability for pulmonary embolism.    Collene Schlichter, MD   04/17/2018 10:48 AM            Assessment/plan:   # ? Infection at previous Cervical operative site  -- Dr. Kizzie Bane - Ortho Spine- input noted   -- MRI and CT scan of cervical and thoracici spine w/o contrast>> no new findings   -- elevated inflammatory markers--- trend  -Appreciate Dr Kizzie Bane' input     # Shingles with severe pain:  --contact precautions>> will d.c as only one dermatome involved per ID  --Continue valtrex x 7 days   --monitor area of skin frequently  -continue Neurontin and lidocaine patch but decrease the dose of dilaudid   -PT/OT      # ?Allergic Reaction  --improved     # intermittent desaturation/Hypoxia:  --  D dimer mildly elevated   -- LE dopplers negative  and V/q scan with low probability for PE  --  CTA or CT of chest could not be done due to lack of good iv access.  -IS and duo nebs   -No h/o smoking but has h/o asthma  -Repeat chest xray with developing opacity?.Will get CT of the chest without contrast.  DVT prophylaxis- lovenox/ SCD    FULL CODE    Anticipated discharge disposition and date:1-2 days     Signed by: Andrew Au, MD   ON:GEXBMW, Cyndra Numbers, MD

## 2018-04-17 NOTE — Progress Notes (Signed)
Case Management   Initial Discharge Planning Assessment    S (Situation) - Pt is a 62 y/o female with a a pmhz of Crohns dz, HTN and a h/o cervical myelopathy with disc herniation of C3-C7 who is s/p anterior cervical diskcetomy and fusionon7/3 who presents with left posterior shoulder blade rash and pain. Assessment completed with pt. Educated pt about CM role in hospital setting.     B (Background) - Verified address, insurance and PCP. Pt reports that she lives with her husband and 53 year old son. Pt was not connected to any HHC. She recently finished an IV ABX course at home with [facility]. Pt was not using any DME. She reports that she has stairs at home, but can avoid them by staying on the main floor.     A (Assessment) - Pt is insured and has good family support. CM recommends PT/OT eval to determine d/c needs. Anticipate either inpt rehab vs HHC. Pt complains a lot about her pain.     R (Request) - Unit CM will follow up once we have PT/OT recs to determine level of need at d/c.     Yehuda Budd, LCSW  PRN Case Manager  Case Management Department  [facility]  [phone]       04/17/18 1103   Patient Type   Within 30 Days of Previous Admission? No   Healthcare Decisions   Interviewed: Patient   Orientation/Decision Making Abilities of Patient Alert and Oriented x3, able to make decisions   Advance Directive Patient does not have advance directive   Healthcare Agent Appointed Yes   (RETIRED) Healthcare Agent's Name Rodena Piety, husband, [phone]   Prior to admission   Prior level of function Independent with ADLs   Type of Residence Private residence   Home Layout Multi-level;Able to live on main level with bedroom/bathroom   Have running water, electricity, heat, etc? Yes   Living Arrangements Spouse/significant other;Children   Dressing Independent   Grooming Independent   Feeding Independent   Bathing Independent   Toileting Independent   Home Care/Community Services None    Adult Protective Services (APS) involved? No   Discharge Planning   Support Systems Spouse/significant other;Children;Family members   Patient expects to be discharged to: likely home, poss HHC   Anticipated [place] plan discussed with: Same as interviewed   Mode of transportation: Private car (family member)   Does the patient have perscription coverage? Yes   Consults/Providers   PT Evaluation Needed 1   OT Evalulation Needed 1   SLP Evaluation Needed 2   Correct PCP listed in Epic? Yes   PCP   PCP on file was verified as the current PCP? Yes   Important Message from [facility] Notice   Patient received 1st IMM Letter? n/a

## 2018-04-17 NOTE — Plan of Care (Signed)
Problem: Safety  Goal: Patient will be free from injury during hospitalization  Outcome: Progressing  Goal: Patient will be free from infection during hospitalization  Outcome: Progressing     Problem: Pain  Goal: Pain at adequate level as identified by patient  Outcome: Progressing     Problem: Discharge Barriers  Goal: Patient will be discharged home or other facility with appropriate resources  Outcome: Progressing     Problem: Moderate/High Fall Risk Score >5  Goal: Patient will remain free of falls  Outcome: Progressing       Pt slept throughout the night.  No comp;aints of pain

## 2018-04-17 NOTE — Progress Notes (Signed)
ID PROGRESS NOTE      Spectra: [phone]    Office: [phone]    Date Time: 04/17/18 @NOW   Patient Name: Chelsea Montoya, Chelsea Montoya    Problem List:      Acute Problem List:   Neck/back pain  -- 10/22  MRI Cervical and thoracic spine - fluid collection resolved, no acute abnormality, Multilevel degenerative changes as described, possibly mildly progressed from prior exam    -- s/p 12/17/17 - Anterior/Posterior Cervical Discectomy/Fusion    Rash  -- vesicles on mid left scapula     ID History:   July 2019  Treated for Epidural abscess   -- 01/01/18 Cervical CT - progressive edema/fluid involving the operative bed involving the cervical soft tissues on the left, with progressive prevertebral soft tissue swelling diffusely  --S/P I&D 01/01/18 Cervical Wound I&D; purulence encountered    Chronic Conditions:  Crohn's  Mitral valve prolapse  Anxiety   Asthma     All: cipro (rash)    Assessment:   62 yo female, known to [place] from previous admission, coming in for rash and neck/back pain  She was treated in July for epidural abscess    Imaging shows resolution of fluid collection and no new acute abnormality    Noted to have severe pain on left scapula  Noted 2 vesicles - possible shingles    Fever overnight  Afebrile now    Pain control seems to be biggest issue at this time     Antimicrobials:     #2 Valtrex 1g PO TID    Plan:   Valtrex for shingles  - plan for 7 day course  Monitor lesions  Pain control    Lines:     piv    Family History:     Family History   Problem Relation Age of Onset   . Ovarian cancer Paternal Aunt 60        died of complications of ovarian ca   . Lung cancer Mother         complications of lung ca   . Kidney failure Father    . Ovarian cancer Paternal Aunt         survivor   . Uterine cancer Maternal Grandmother 50        survivior   . Breast cancer Paternal Grandmother    . Colon cancer Neg Hx        Social History:     Social History     Socioeconomic History   . Marital status:  Married     Spouse name: Not on file   . Number of children: Not on file   . Years of education: Not on file   . Highest education level: Not on file   Occupational History   . Not on file   Social Needs   . Financial resource strain: Not on file   . Food insecurity:     Worry: Not on file     Inability: Not on file   . Transportation needs:     Medical: Not on file     Non-medical: Not on file   Tobacco Use   . Smoking status: Never Smoker   . Smokeless tobacco: Never Used   Substance and Sexual Activity   . Alcohol use: Yes     Comment: occasionally   . Drug use: No   . Sexual activity: Yes     Partners: Male     Birth control/protection: None  Lifestyle   . Physical activity:     Days per week: Not on file     Minutes per session: Not on file   . Stress: Not on file   Relationships   . Social connections:     Talks on phone: Not on file     Gets together: Not on file     Attends religious service: Not on file     Active member of club or organization: Not on file     Attends meetings of clubs or organizations: Not on file     Relationship status: Not on file   . Intimate partner violence:     Fear of current or ex partner: Not on file     Emotionally abused: Not on file     Physically abused: Not on file     Forced sexual activity: Not on file   Other Topics Concern   . Not on file   Social History Narrative   . Not on file       Allergies:     Allergies   Allergen Reactions   . Flexeril [Cyclobenzaprine] Nausea And Vomiting     Flu like symptoms   . Darvon Other (See Comments)     Gastric distress     . Latex    . Augmentin [Amoxicillin-Pot Clavulanate] Nausea And Vomiting   . Ciprofloxacin Rash   . Keflex [Cephalexin] Other (See Comments)     Gastric upset.       Review of Systems:   Burning and sharp pain left scapula  Fevers overnight  No CP  No SOB    Physical Exam:     Vitals:    04/17/18 1500   BP: 99/59   Pulse: 92   Resp: 18   Temp: 99.5 F (37.5 C)   SpO2: 94%       General Appearance: alert and  appropriate, non-toxic  Neuro: alert, oriented, normal speech, normal attention and cognition    HEENT: no scleral icterus,   Neck: supple,   Abdomen: soft, non-tender  Extremities: no pedal edema  Skin: 2 vesicular lesions on lower mid left scapulat    Labs:     Lab Results   Component Value Date    WBC 11.16 (H) 04/16/2018    HGB 12.3 04/16/2018    HCT 38.9 04/16/2018    MCV 88.0 04/16/2018    PLT 246 04/16/2018     Lab Results   Component Value Date    CREAT 0.6 04/16/2018     Lab Results   Component Value Date    ALT 16 01/05/2018    AST 30 01/05/2018    ALKPHOS 63 01/05/2018    BILITOTAL 0.4 01/05/2018     No results found for: LACTATE    Microbiology:     Microbiology Results     None          Rads:   Nm Lung Ventilation Perfusion Aerosol    Result Date: 04/17/2018   Low probability for pulmonary embolism. Collene Schlichter, MD 04/17/2018 10:48 AM    Xr Chest Ap Portable    Result Date: 04/17/2018  . Developing right upper lobe opacity Marty Heck, MD 04/17/2018 3:37 PM      Signed by: Claiborne Rigg, MD

## 2018-04-17 NOTE — Plan of Care (Addendum)
NURSING PROGRESS NOTE    Patient Name: Chelsea Montoya (62 y.o. female)  Admission Date: 04/15/2018 [facility] Day 2)    Shift Note: AxOx4, FC, MAE w/ generalized weakness. 1x assist oob. On 1.5 L NC. Tele- NSR. PRN dilaudid 2 mg PO given at 1844. 100.2 fever - PRN tylenol given . Chest x-ray, BNP done.      Recent Labs   Lab 04/16/18  0448   Sodium 137   Potassium 4.0   Chloride 101   CO2 25   BUN 5.0*   Creatinine 0.6   EGFR >60.0   Glucose 103*   Calcium 8.7       Recent Labs   Lab 04/16/18  0449   WBC 11.16*   Hgb 12.3   Hematocrit 38.9   Platelets 246         Patient Lines/Drains/Airways Status      Active Lines, Drains and Airways       Name:   Placement date:   Placement time:   Site:   Days:    Peripheral IV 04/15/18 Left Hand   04/15/18    1015    Hand   2    Peripheral IV 04/15/18 Anterior;Distal;Right Forearm   04/15/18    2340    Forearm   1                    MEWS Score: 2    Last BM: 10/22  Pending Orders: Blood cultures, Chest CT wo contrast   Discharge Plan: NA    Safety Checklist    Fall Precautions Y    Avasys N    Seizure Precautions N    Aspiration Precautions N       Problem: Safety  Goal: Patient will be free from injury during hospitalization  Outcome: Progressing    Patient will be free from injury during hospitalization : Assess patient's risk for falls and implement fall prevention plan of care per policy;Use appropriate transfer methods;Provide and maintain safe environment;Ensure appropriate safety devices are available at the bedside;Include patient/ family/ care giver in decisions related to safety;Hourly rounding  Goal: Patient will be free from infection during hospitalization  Outcome: Progressing  Flowsheets (Taken 04/17/2018 1849)  Free from Infection during hospitalization: Assess and monitor for signs and symptoms of infection; Monitor lab/diagnostic results; Monitor all insertion sites (i.e. indwelling lines, tubes, urinary catheters, and drains); Encourage patient and family to  use good hand hygiene technique     Problem: Pain  Goal: Pain at adequate level as identified by patient  Outcome: Progressing  Pain at adequate level as identified by patient: Assess for risk of opioid induced respiratory depression, including snoring/sleep apnea. Alert healthcare team of risk factors identified.;Assess pain on admission, during daily assessment and/or before any "as needed" intervention(s);Reassess pain within 30-60 minutes of any procedure/intervention, per Pain Assessment, Intervention, Reassessment (AIR) Cycle;Evaluate if patient comfort function goal is met;Evaluate patient's satisfaction with pain management progress;Offer non-pharmacological pain management interventions;Consult/collaborate with Physical Therapy, Occupational Therapy, and/or Speech Therapy;Include patient/patient care companion in decisions related to pain management as needed;Consult/collaborate with Pain Service     Problem: Side Effects from Pain Analgesia  Goal: Patient will experience minimal side effects of analgesic therapy  Outcome: Progressing  Patient will experience minimal side effects of analgesic therapy: Monitor/assess patient's respiratory status (RR depth, effort, breath sounds);Prevent/manage side effects per LIP orders (i.e. nausea, vomiting, pruritus, constipation, urinary retention, etc.);Evaluate for opioid-induced sedation with appropriate assessment tool (  i.e. POSS);Assess for changes in cognitive function     Problem: Inadequate Gas Exchange  Goal: Adequate oxygenation and improved ventilation  Outcome: Progressing  Adequate oxygenation and improved ventilation: Assess lung sounds;Monitor SpO2 and treat as needed;Provide mechanical and oxygen support to facilitate gas exchange;Teach/reinforce use of incentive spirometer 10 times per hour while awake, cough and deep breath as needed;Plan activities to conserve energy: plan rest periods;Increase activity as tolerated/progressive mobility;Position for  maximum ventilatory efficiency     Problem: Impaired Mobility  Goal: Mobility/Activity is maintained at optimal level for patient  Outcome: Progressing  Mobility/activity is maintained at optimal level for patient: Increase mobility as tolerated/progressive mobility;Encourage independent activity per ability;Maintain proper body alignment;Perform active/passive ROM;Reposition patient every 2 hours and as needed unless able to reposition self;Plan activities to conserve energy, plan rest periods;Assess for changes in respiratory status, level of consciousness and/or development of fatigue;Consult/collaborate with Physical Therapy and/or Occupational Therapy

## 2018-04-17 NOTE — OT Eval Note (Signed)
[facility]   Occupational Therapy Evaluation     Patient: Chelsea Montoya    MRN#: [id number]   Unit: [facility] TOWER 7  Bed: V409/W119.14                                     Discharge Recommendations:   Discharge Recommendation: Home with supervision and ADL assist as needed, Home with home health OT   DME Recommended for Discharge: (Pt equipped )    If Home with supervision, Home with home health OT recommended discharge disposition is not available, patient will need rehab.     Discharge recommendations are subject to change based on patient's progress. Please refer to the most recent treatment note for the most up to date recommendation.      Assessment:   Chelsea Montoya is a 62 y.o. female admitted 04/15/2018.   Extensive chart review completed including review of labs, review of imaging, review of vitals, review of past hospitalizations, review of H&P and physician progress notes and review of consulting physician notes .  Pt's ability to complete ADLs and functional transfers is impaired due to the following deficits:  decreased activity tolerance, decreased balance, decreased bed mobility, pain, decreased strength and transfers .  Pt demonstrates performance deficits with grooming, bathing, dressing, toileting and functional mobility. There are a few comorbidities or other factors that affect plan of care and require modification of task including: neuropathy, residual neurological symptoms and has stairs to manage.      Pt agreeable to therapy session, mostly limited by pain. Pt overall min assist for short household distances, however quite unsteady and reaching for walls during ambulation. Would benefit from RW, defer to PT recommendation. Pt would benefit from PT eval. Pt has RW at home from previous surgeries. Pt states that she lives at home on one level w/ her spouse. Pt required max assist don/doff socks sitting in chair 2/2 pain. Pt will need assist for ADLs upon d/c  home, if unavailable, see alternate recs. Pt demo's deficits in sensation, balance, strength, and activity tolerance. Pt would continue to benefit from skilled OT services in order to increase independence and safety in ADLs and functional mobility.       Therapy Diagnosis: Decreased ADL independence     Rehabilitation Potential: good for stated goals     Treatment Activities: OT eval, functional transfers, bed mobility, functional ambulation, activity tolerance training, pain management    Educated the patient to role of occupational therapy, plan of care, goals of therapy and safety with mobility and ADLs, home safety.    Plan:   OT Frequency Recommended: 2-3x/wk     Treatment/Interventions: ADL retraining, functional transfers, bed mobility, functional ambulation, activity tolerance training, energy conservation, pain management, compensatory technique education/training, family/caregiver education    Risks/benefits/POC discussed w/ patient        Precautions and Contraindications:   Falls    Consult received for [name] for OT Evaluation and Treatment.  Patient's medical condition is appropriate for Occupational Therapy intervention at this time.      History of Present Illness:    Chelsea Montoya is a 62 y.o. female admitted on 04/15/2018 with "disc herniation of C3-C7 who is s/p anterior cervical diskcetomy and fusion on  7/3 who presents with left posterior shoulder blade rash and pain * 1 week--    Patient defines the  pain as sharp and also " bone pain"-- 10/10. Its located at the inferior portion of the left scapular and mid vertebral area and at times on the right shoulder as well. She denies and SOB or palpitations.  She states she had some pain when visiting [facility] with her family and usually she can take an oxycodone to control but this pain is significantly worse with NO relief with 2 oxycodne which is very rare. Since the pain became more intense she decided to come to the ER." - per  H&P    Admitting Diagnosis: Thoracic back pain, unspecified back pain laterality, unspecified chronicity [M54.6]  Thoracic back pain, unspecified back pain laterality, unspecified chronicity [M54.6]    Past Medical/Surgical History:  Past Medical History:   Diagnosis Date   . Anemia     resolved   . Anxiety    . Arrhythmia     followed by cardio    . Arthritis     osteoarthritis   . Asthma without status asthmaticus     allergy-induced only- LD 09/2017   . Blood transfusion without reported diagnosis     14 years ago   . Complication of anesthesia     wake up fast   . Crohn's colitis 01/31/2012   . Crohn's disease     remission - colonoscopy    . Duplicated right renal collecting system    . Heart murmur     followed by cardio    . Hypertension     Runs 120/78's   . Insomnia    . Kidney stone    . Mitral valve prolapse    . Pneumonia    . Pyelonephritis    . Seizures     Just once in 1978- stress induced    . Shortness of breath     R/T allergy   . Thyroid nodule     BILATERAL 09/2014; RT LOBE THYROID NODULE BX - BENIGN   . Vision abnormalities      Past Surgical History:   Procedure Laterality Date   . APPENDECTOMY  2000's   . BUNIONECTOMY Right 1998   . CLOSED REDUCTION, PERCUTANEOUS FIXATION, FINGER  10/28/2012    Procedure: CLOSED REDUCTION, PERCUTANEOUS FIXATION, FINGER;  Surgeon: Arta Bruce, MD;  Location: [facility];  Service: Orthopedics;  Laterality: Left;  Percutaneous Fixation of Left Ring Finger.     . COLECTOMY  1988    colon resection   . COLONOSCOPY W/ BIOPSIES  02/2016 2019    Dr Cinda Quest - 1 POLYP, INT HEMORRHOIDS, CROHN'S IN REMISSION - RTN 2021    . ELBOW SURGERY Right 2010   . ENDOMETRIAL BIOPSY  04/22/2014    RARE SUPERFICIAL ENDOMETRIAL GLANDS AMIDST MUCUS.TISSUE IS INSUFFICIENT FOR THE OPTIMAL EVALUATION OF THE  ENDOMETRIAL CAVITY.   Lavenia Atlas, ANTERIOR CERVICAL, LEVELS 3+ N/A 12/26/2017    Procedure: FUSION, ANTERIOR CERVICAL, LEVELS 3+;  Surgeon: Kizzie Ide, MD;  Location: [place]  T[facility] OR;  Service: Orthopedics;  Laterality: N/A;  C3-C7 ANTERIOR CERVICAL DISCECTOMY & FUSION   . GYNECOLOGIC CRYOSURGERY  1970's   . INCISION & DRAINAGE, WOUND N/A 01/01/2018    Procedure: INCISION & DRAINAGE, WOUND;  Surgeon: Kizzie Ide, MD;  Location: [facility];  Service: Orthopedics;  Laterality: N/A;   . KNEE ARTHROPLASTY Right 2001, 2016   . KNEE ARTHROPLASTY Left 2009   . THYROID NODULE BIOPSY  09/2014    RT LOBE THYROID NODULE  Imaging/Tests/Labs:  Xr Chest 2 Views    Result Date: 04/15/2018   No acute disease. Stable cardiomegaly. Kennyth Lose, MD 04/15/2018 10:13 AM    Ct Cervical Spine Wo Contrast    Result Date: 04/15/2018  1. Surgical changes related to an ACDF spanning C3-C7 which is stable in the study interval. 2. Stable degenerative changes of the cervical spine. 3. The previously seen prevertebral collection in the cervical region no longer identified. 4. Thoracic degenerative changes similar to the prior MR when allowing for differences in technique. Otho Ket, MD 04/15/2018 5:53 PM    Ct Thoracic Spine Wo Contrast    Result Date: 04/15/2018  1. Surgical changes related to an ACDF spanning C3-C7 which is stable in the study interval. 2. Stable degenerative changes of the cervical spine. 3. The previously seen prevertebral collection in the cervical region no longer identified. 4. Thoracic degenerative changes similar to the prior MR when allowing for differences in technique. Otho Ket, MD 04/15/2018 5:53 PM    Mri Cervical Spine Wo Contrast    Result Date: 04/16/2018  1. Interval resolution of the previously seen cervical prevertebral collection. No new paraspinal or intraspinal collection. 2. No significant new or acute abnormality of the cervicothoracic spine. 3. Multilevel degenerative changes as described, possibly mildly progressed from prior exam, with probable active inflammatory changes along the C7-T1 facet joints, right greater than left, and  degenerative endplate marrow edema at the L1-L2 level. Susy Frizzle, MD 04/16/2018 9:35 AM    Mri Thoracic Spine Wo Contrast    Result Date: 04/16/2018  1. Interval resolution of the previously seen cervical prevertebral collection. No new paraspinal or intraspinal collection. 2. No significant new or acute abnormality of the cervicothoracic spine. 3. Multilevel degenerative changes as described, possibly mildly progressed from prior exam, with probable active inflammatory changes along the C7-T1 facet joints, right greater than left, and degenerative endplate marrow edema at the L1-L2 level. Susy Frizzle, MD 04/16/2018 9:35 AM    Nm Lung Ventilation Perfusion Aerosol    Result Date: 04/17/2018   Low probability for pulmonary embolism. Collene Schlichter, MD 04/17/2018 10:48 AM    US Venous Duplex Doppler Leg Bilateral    Result Date: 04/16/2018   No evidence of lower extremity venous thrombosis. Wynema Birch, MD 04/16/2018 2:58 PM        Social History:   Prior Level of Function: Independent w/ ADLs and functional mobility  Assistive Devices: none  Baseline Activity: community ambulation   DME Currently at Home: RW, shower chair   Home Living Arrangements: Lives w/ spouse  Type of Home: Multi-level house  Home Layout: Mutli-level house w/ STE, lives on main level. Walk in shower     Subjective: "It hurts more when I'm moving"    Patient is agreeable to participation in the therapy session. Nursing clears patient for therapy.     Patient Goal: To get better  Pain:   Scale: 3/10 when sedentary, 9/10 when moving  Location: thoracic back region   Intervention: Pt repositioned, RN aware    Objective:   Patient is in bed with telemetry, 1 lpm O2 via nasal cannula, and peripheral IV access in place.      Cognitive Status and Neuro Exam:  Alert/Oriented: A&Ox3  Following Commands: Able to follow simple commands  Behavior: Pleasant and cooperative, lethargic   Attention: Appears in tact  Safety Awareness: Educated  on safety       Musculoskeletal  Examination  RUE ROM: AROM to ~100 degrees sh flex 2/2 pain; elbow, wrist, fingers WFL  LUE ROM: AROM to ~100 degrees sh flex 2/2 pain; elbow, wrist, fingers WFL  RLE ROM: grossly WFL  LLE ROM: grossly WFL    RUE Strength: grossly 4/5  LUE Strength: grossly 4/5  RLE Strength: grossly WFL  LLE Strength: grossly WFL      Sensory/Oculomotor Examination  Auditory: WFL on eval  Tactile: Pt reports baseline numbness on posterior RUE, unable to localize LT. Pt reports tingling in upper LUE  Vision: Pt denies diplopia or acute vision changes      Activities of Daily Living  Eating: Set up   Grooming: Supervision  UE Dressing: Min assist to don/doff gown   LE Dressing: Max assist to don/doff socks sitting in chair   Toileting: SBA     Functional Mobility:  Supine to Sit: Min assist, vc for log roll technique   Sit to Stand: Min assist   Transfers: min assist functional ambulation short household distances     PMP Activity: Step 6 - Walks in Room      Balance  Static Sitting: Supervision   Dynamic Sitting: SBA-CGA  Static Standing: Min assist   Dynamic Standing: min assist     Participation and Activity Tolerance  Participation Effort: good  Endurance: fair     Patient left with call bell within reach, all needs met, SCDs off as found, fall mat in place, bed alarm n/a, chair alarm ON and all questions answered. RN notified of session outcome and patient response.       Goals:  Time For Goal Achievement: 3 visits  ADL Goals  Patient will groom self: Supervision, at sinkside, 3 visits  Patient will dress lower body: Supervision, 3 visits  Patient will toilet: Supervision, 3 visits  Mobility and Transfer Goals  Pt will perform functional transfers: Supervision, 3 visits(w/ LRAD)                         Drucie Ip, OTD, OTR/L  Pager [phone]      Time of treatment:   OT Received On: 04/17/18  Start Time: 1140  Stop Time: 1235  Time Calculation (min): 55 min

## 2018-04-17 NOTE — Plan of Care (Signed)
Problem: Safety  Goal: Patient will be free from injury during hospitalization  Outcome: Progressing  Flowsheets (Taken 04/16/2018 0441 by Britt Bottom, RN)  Patient will be free from injury during hospitalization : Assess patient's risk for falls and implement fall prevention plan of care per policy;Use appropriate transfer methods;Provide and maintain safe environment;Ensure appropriate safety devices are available at the bedside;Include patient/ family/ care giver in decisions related to safety;Hourly rounding  Goal: Patient will be free from infection during hospitalization  Outcome: Progressing  Flowsheets (Taken 04/17/2018 1849 by Jearld Lesch, RN)  Free from Infection during hospitalization: Assess and monitor for signs and symptoms of infection;Monitor lab/diagnostic results;Monitor all insertion sites (i.e. indwelling lines, tubes, urinary catheters, and drains);Encourage patient and family to use good hand hygiene technique     Problem: Pain  Goal: Pain at adequate level as identified by patient  Outcome: Progressing  Flowsheets (Taken 04/16/2018 1641 by Learta Codding, RN)  Pain at adequate level as identified by patient: Assess for risk of opioid induced respiratory depression, including snoring/sleep apnea. Alert healthcare team of risk factors identified.;Assess pain on admission, during daily assessment and/or before any "as needed" intervention(s);Reassess pain within 30-60 minutes of any procedure/intervention, per Pain Assessment, Intervention, Reassessment (AIR) Cycle;Evaluate if patient comfort function goal is met;Evaluate patient's satisfaction with pain management progress;Offer non-pharmacological pain management interventions;Consult/collaborate with Physical Therapy, Occupational Therapy, and/or Speech Therapy;Include patient/patient care companion in decisions related to pain management as needed;Consult/collaborate with Pain Service     Problem: Side Effects from Pain  Analgesia  Goal: Patient will experience minimal side effects of analgesic therapy  Outcome: Progressing     Problem: Discharge Barriers  Goal: Patient will be discharged home or other facility with appropriate resources  Outcome: Progressing     Problem: Psychosocial and Spiritual Needs  Goal: Demonstrates ability to cope with hospitalization/illness  Outcome: Progressing     Problem: Compromised Tissue integrity  Goal: Damaged tissue is healing and protected  Outcome: Progressing  Flowsheets (Taken 04/17/2018 2133)  Damaged tissue is healing and protected : Monitor/assess Braden scale every shift; Provide wound care per wound care algorithm; Reposition patient every 2 hours and as needed unless able to reposition self; Increase activity as tolerated/progressive mobility; Relieve pressure to bony prominences for patients at moderate and high risk; Avoid shearing injuries; Keep intact skin clean and dry; Use bath wipes, not soap and water, for daily bathing  Goal: Nutritional status is improving  Outcome: Progressing     Problem: Moderate/High Fall Risk Score >5  Goal: Patient will remain free of falls  Outcome: Progressing     Problem: Compromised Hemodynamic Status  Goal: Vital signs and fluid balance maintained/improved  Outcome: Progressing  Flowsheets (Taken 04/17/2018 2133)  Vital signs and fluid balance are maintained/improved: Position patient for maximum circulation/cardiac output     Problem: Inadequate Tissue Perfusion-Venous  Goal: Tissue perfusion is adequate-venous  Outcome: Progressing  Flowsheets (Taken 04/16/2018 1641 by Learta Codding, RN)  Tissue perfusion is adequate-venous : Increase activity as tolerated / progressive mobility;Elevate feet when in chair;Teach/review/reinforce ankle pump exercises;VTE  prevention: Administer anticoagulant(s) and/or apply anti-embolism stockings/devices as ordered     Problem: Impaired Mobility  Goal: Mobility/Activity is maintained at optimal level for  patient  Outcome: Progressing  Flowsheets (Taken 04/16/2018 1641 by Learta Codding, RN)  Mobility/activity is maintained at optimal level for patient: Increase mobility as tolerated/progressive mobility;Encourage independent activity per ability;Maintain proper body alignment;Perform active/passive ROM;Reposition patient every 2 hours and as needed unless able to  reposition self;Plan activities to conserve energy, plan rest periods;Assess for changes in respiratory status, level of consciousness and/or development of fatigue;Consult/collaborate with Physical Therapy and/or Occupational Therapy

## 2018-04-18 LAB — CBC
Absolute NRBC: 0 10*3/uL (ref 0.00–0.00)
Hematocrit: 36.9 % (ref 34.7–43.7)
Hgb: 11.8 g/dL (ref 11.4–14.8)
MCH: 28.6 pg (ref 25.1–33.5)
MCHC: 32 g/dL (ref 31.5–35.8)
MCV: 89.6 fL (ref 78.0–96.0)
MPV: 9.4 fL (ref 8.9–12.5)
Nucleated RBC: 0 /100 WBC (ref 0.0–0.0)
Platelets: 260 10*3/uL (ref 142–346)
RBC: 4.12 10*6/uL (ref 3.90–5.10)
RDW: 14 % (ref 11–15)
WBC: 9.07 10*3/uL (ref 3.10–9.50)

## 2018-04-18 LAB — BASIC METABOLIC PANEL
BUN: 6 mg/dL — ABNORMAL LOW (ref 7.0–19.0)
CO2: 27 mEq/L (ref 22–29)
Calcium: 8.7 mg/dL (ref 8.5–10.5)
Chloride: 102 mEq/L (ref 100–111)
Creatinine: 0.6 mg/dL (ref 0.6–1.0)
Glucose: 86 mg/dL (ref 70–100)
Potassium: 4.1 mEq/L (ref 3.5–5.1)
Sodium: 139 mEq/L (ref 136–145)

## 2018-04-18 LAB — VARICELLA ZOSTER ANTIBODY, IGM: Varicella Zoster Antibody IgM: NEGATIVE

## 2018-04-18 LAB — GFR: EGFR: >60

## 2018-04-18 MED ORDER — SODIUM CHLORIDE 0.9 % IV MBP
1.00 g | INTRAVENOUS | Status: DC
Start: 2018-04-18 — End: 2018-04-19
  Administered 2018-04-18 – 2018-04-19 (×2): 1 g via INTRAVENOUS
  Filled 2018-04-18 (×2): qty 1000

## 2018-04-18 MED ORDER — KETOROLAC TROMETHAMINE 30 MG/ML IJ SOLN
15.00 mg | Freq: Four times a day (QID) | INTRAMUSCULAR | Status: DC | PRN
Start: 2018-04-18 — End: 2018-04-19
  Administered 2018-04-18: 19:00:00 15 mg via INTRAVENOUS
  Filled 2018-04-18: qty 1

## 2018-04-18 MED ORDER — HYDROMORPHONE HCL 2 MG PO TABS
2.00 mg | ORAL_TABLET | ORAL | Status: DC | PRN
Start: 2018-04-18 — End: 2018-04-19
  Administered 2018-04-18 – 2018-04-19 (×3): 2 mg via ORAL
  Filled 2018-04-18 (×3): qty 1

## 2018-04-18 MED ORDER — AZITHROMYCIN 250 MG PO TABS
500.00 mg | ORAL_TABLET | Freq: Every day | ORAL | Status: DC
Start: 2018-04-18 — End: 2018-04-18
  Filled 2018-04-18 (×2): qty 2

## 2018-04-18 MED ORDER — GABAPENTIN 300 MG PO CAPS
300.00 mg | ORAL_CAPSULE | Freq: Three times a day (TID) | ORAL | Status: DC
Start: 2018-04-18 — End: 2018-04-19
  Administered 2018-04-18 – 2018-04-19 (×3): 300 mg via ORAL
  Filled 2018-04-18 (×3): qty 1

## 2018-04-18 NOTE — PT Eval Note (Signed)
[facility]   Physical Therapy Evaluation   Patient: Chelsea Montoya    MRN#: [id number]   Unit: [facility] TOWER 7  Bed: V409/W119.14    Discharge Recommendations:   Discharge Recommendation: Home with supervision, Home with home health PT   DME Recommended for Discharge: Front wheel walker    Discharge recommendations are subject to change based on patient's progress / regression. Please refer to the most recent treatment note for the current recommendation. Thank You!     Assessment:   Chelsea Montoya is a 62 y.o. female admitted 04/15/2018 with h/o cervical myelopathy with disc herniation of C3-C7 who is s/p anterior cervical diskcetomy and fusion on  7/3 who presents with left posterior shoulder blade rash and pain.  Pt's functional mobility is impacted by:  decreased activity tolerance, decreased balance and gait impairment.  There are a few comorbidities or other factors that affect plan of care and require modification of task including: assistive device needed for mobility, has stairs to manage and home alone for a portion of the day.  Standardized tests and exams incorporated into evaluation include balance, ROM  and Strength.  Pt demonstrates a stable clinical presentation. Pt mod I with bed mobility with elevated HOB. Pt SBA for sit <> stand and ambulation of 136ft using RW. Required min VCs on proper hand placement during sit <>stand for safety and min VCs during ambulation for proper posture. Pt complained of pain in B shoudlers, back and sides during entire pt session, increased pain during sit <>stand. Ambulation distance limited by pt feeling of unsteadiness.  Pt would continue to benefit from PT to address these deficits and increase functional independence.     Impairments: Assessment: Decreased LE strength;Decreased UE strength;Decreased endurance/activity tolerance;Decreased functional mobility;Decreased balance;Gait impairment.     Therapy Diagnosis: balance, gait  impairment    Rehabilitation Potential: Prognosis: Good    Treatment Activities:  PT Evaluation. Gait, transfers. Educated the patient to role of physical therapy, plan of care, goals of therapy and Caroline recommendations and safety with mobility.    Plan:   Treatment/Interventions: Exercise;Gait training;Stair training;Functional transfer training;LE strengthening/ROM;Endurance training;Equipment eval/education     PT Frequency: 2-3x/wk   Risks/Benefits/POC Discussed with Pt/Family: With patient        Precautions and Contraindications:   Other Precautions: fall    Consult received for [name] for PT Evaluation and Treatment.  Patient's medical condition is appropriate for Physical therapy intervention at this time.    Medical Diagnosis: Thoracic back pain, unspecified back pain laterality, unspecified chronicity [M54.6]  Thoracic back pain, unspecified back pain laterality, unspecified chronicity [M54.6]      History of Present Illness:   Chelsea Montoya is a 62 y.o. female admitted on 04/15/2018 with h/o cervical myelopathy with disc herniation of C3-C7 who is s/p anterior cervical diskcetomy and fusion on  7/3 who presents with left posterior shoulder blade rash and pain.    Past Medical/Surgical History:  Past Medical History:   Diagnosis Date   . Anemia     resolved   . Anxiety    . Arrhythmia     followed by cardio    . Arthritis     osteoarthritis   . Asthma without status asthmaticus     allergy-induced only- LD 09/2017   . Blood transfusion without reported diagnosis     14 years ago   . Complication of anesthesia     wake up fast   .  Crohn's colitis 01/31/2012   . Crohn's disease     remission - colonoscopy    . Duplicated right renal collecting system    . Heart murmur     followed by cardio    . Hypertension     Runs 120/78's   . Insomnia    . Kidney stone    . Mitral valve prolapse    . Pneumonia    . Pyelonephritis    . Seizures     Just once in 1978- stress induced    . Shortness of breath     R/T  allergy   . Thyroid nodule     BILATERAL 09/2014; RT LOBE THYROID NODULE BX - BENIGN   . Vision abnormalities        Past Surgical History:   Procedure Laterality Date   . APPENDECTOMY  2000's   . BUNIONECTOMY Right 1998   . CLOSED REDUCTION, PERCUTANEOUS FIXATION, FINGER  10/28/2012    Procedure: CLOSED REDUCTION, PERCUTANEOUS FIXATION, FINGER;  Surgeon: Arta Bruce, MD;  Location: [facility];  Service: Orthopedics;  Laterality: Left;  Percutaneous Fixation of Left Ring Finger.     . COLECTOMY  1988    colon resection   . COLONOSCOPY W/ BIOPSIES  02/2016 2019    Dr Cinda Quest - 1 POLYP, INT HEMORRHOIDS, CROHN'S IN REMISSION - RTN 2021    . ELBOW SURGERY Right 2010   . ENDOMETRIAL BIOPSY  04/22/2014    RARE SUPERFICIAL ENDOMETRIAL GLANDS AMIDST MUCUS.TISSUE IS INSUFFICIENT FOR THE OPTIMAL EVALUATION OF THE  ENDOMETRIAL CAVITY.   Lavenia Atlas, ANTERIOR CERVICAL, LEVELS 3+ N/A 12/26/2017    Procedure: FUSION, ANTERIOR CERVICAL, LEVELS 3+;  Surgeon: Kizzie Ide, MD;  Location: [place] [facility];  Service: Orthopedics;  Laterality: N/A;  C3-C7 ANTERIOR CERVICAL DISCECTOMY & FUSION   . GYNECOLOGIC CRYOSURGERY  1970's   . INCISION & DRAINAGE, WOUND N/A 01/01/2018    Procedure: INCISION & DRAINAGE, WOUND;  Surgeon: Kizzie Ide, MD;  Location: [place] [facility];  Service: Orthopedics;  Laterality: N/A;   . KNEE ARTHROPLASTY Right 2001, 2016   . KNEE ARTHROPLASTY Left 2009   . THYROID NODULE BIOPSY  09/2014    RT LOBE THYROID NODULE         X-Rays/Tests/Labs:  Xr Chest 2 Views    Result Date: 04/15/2018   No acute disease. Stable cardiomegaly. Kennyth Lose, MD 04/15/2018 10:13 AM    Ct Chest Wo Contrast    Result Date: 04/17/2018   New focal opacity in the dependent portion of the right upper lobe. Rocky Crafts, MD 04/17/2018 10:18 PM    Ct Cervical Spine Wo Contrast    Result Date: 04/15/2018  1. Surgical changes related to an ACDF spanning C3-C7 which is stable in the study interval. 2. Stable degenerative changes  of the cervical spine. 3. The previously seen prevertebral collection in the cervical region no longer identified. 4. Thoracic degenerative changes similar to the prior MR when allowing for differences in technique. Otho Ket, MD 04/15/2018 5:53 PM    Ct Thoracic Spine Wo Contrast    Result Date: 04/15/2018  1. Surgical changes related to an ACDF spanning C3-C7 which is stable in the study interval. 2. Stable degenerative changes of the cervical spine. 3. The previously seen prevertebral collection in the cervical region no longer identified. 4. Thoracic degenerative changes similar to the prior MR when allowing for differences in technique. Otho Ket, MD 04/15/2018 5:53  PM    Mri Cervical Spine Wo Contrast    Result Date: 04/16/2018  1. Interval resolution of the previously seen cervical prevertebral collection. No new paraspinal or intraspinal collection. 2. No significant new or acute abnormality of the cervicothoracic spine. 3. Multilevel degenerative changes as described, possibly mildly progressed from prior exam, with probable active inflammatory changes along the C7-T1 facet joints, right greater than left, and degenerative endplate marrow edema at the L1-L2 level. Susy Frizzle, MD 04/16/2018 9:35 AM    Mri Thoracic Spine Wo Contrast    Result Date: 04/16/2018  1. Interval resolution of the previously seen cervical prevertebral collection. No new paraspinal or intraspinal collection. 2. No significant new or acute abnormality of the cervicothoracic spine. 3. Multilevel degenerative changes as described, possibly mildly progressed from prior exam, with probable active inflammatory changes along the C7-T1 facet joints, right greater than left, and degenerative endplate marrow edema at the L1-L2 level. Susy Frizzle, MD 04/16/2018 9:35 AM    Nm Lung Ventilation Perfusion Aerosol    Result Date: 04/17/2018   Low probability for pulmonary embolism. Collene Schlichter, MD 04/17/2018 10:48 AM    Xr  Chest Ap Portable    Result Date: 04/17/2018  . Developing right upper lobe opacity Marty Heck, MD 04/17/2018 3:37 PM    US Venous Duplex Doppler Leg Bilateral    Result Date: 04/16/2018   No evidence of lower extremity venous thrombosis. Wynema Birch, MD 04/16/2018 2:58 PM    Social History:   Prior Level of Function:  Prior level of function: Independent with ADLs, Ambulates independently  Baseline Activity Level: Community ambulation  Driving: independent  Cooking: independent  DME Currently at Home: [employer] walker, Grab bars    Home Living Arrangements:  Living Arrangements: Spouse/significant other, Children  Type of Home: House  Home Layout: Two level, Laundry in basement, Able to live on main level with bedroom/bathroom, Stairs to enter with rails (add number in comment)(4 steps to enter house)  [employer]: Grab bars in shower  DME Currently at Home: [employer] walker, Grab bars  Home Living - Notes / Comments: husband and son able to do laundry found in basement; pt able to stay on main floor    Subjective:   Patient is agreeable to participation in the therapy session. Nursing clears patient for therapy.     "I feel wobbly from the medication."       Pain Assessment  Pain Assessment: Numeric Scale (0-10)  Pain Score: 6-moderate pain  Pain Location: Shoulder;Back    Objective:   Observation of Patient/Vital Signs:  Patient is in bed with O2 at 1.5 liters/minute via nasal cannula in place.    Observation of Patient/Vital signs:       Cognition/Neuro Status  Arousal/Alertness: Appropriate responses to stimuli  Attention Span: Appears intact  Orientation Level: Oriented X4  Memory: Appears intact  Following Commands: Follows all commands and directions without difficulty  Safety Awareness: independent  Insights: Fully aware of deficits         Musculoskeletal Examination:  Gross ROM  Right Upper Extremity ROM: within functional limits  Left Upper Extremity ROM: within functional  limits  Right Lower Extremity ROM: within functional limits  Left Lower Extremity ROM: within functional limits    Gross Strength  Right Upper Extremity Strength: within functional limits  Left Upper Extremity Strength: within functional limits  Right Lower Extremity Strength: within functional limits  Left Lower Extremity Strength: within  functional limits         Functional Mobility:  Supine to Sit: Modified Independent(HOB elevated)  Sit to Stand: Stand by Assist(min VCs for proper hand placement for safety)  Stand to Sit: Stand by Assist(min VCs for proper hand placement for safety)         PMP - Progressive Mobility Protocol   PMP Activity: Step 7 - Walks out of Room  Distance Walked (ft) (Step 6,7): 150 Feet     Ambulation:  Ambulation: Stand by Assist;with front-wheeled walker  Pattern: shuffle;decreased cadence;decreased step length(decreased cadence and shuffle 2/2 pain)     Balance:  Sitting - Static: Good  Sitting - Dynamic: Good  Standing - Static: Good  Standing - Dynamic: Fair         Participation and Activity Tolerance:  Participation Effort: good  Endurance: Tolerates 10 - 20 min exercise with multiple rests    Patient left with call bell within reach, SCDs on, chair alarm on, bed alarm N/A, fall mats in place all needs met and all questions answered, RN notified of session outcome and patient response.    Goals:   Goals  Goal Formulation: With patient  Time for Goal Acheivement: 2 visits  Pt Will Demo Standing Balance: 4+/5 indep, moves/returns center of grav in multiple planes 1-2"  Pt Will Ambulate: > 200 feet;with rolling walker;modified independent  Pt Will Go Up / Down Stairs: 3-5 stairs;modified independent;With rail       Time of treatment:   PT Received On: 04/18/18  Start Time: 1015  Stop Time: 1040  Time Calculation (min): 25 min      Terie Purser  DPT, PT  Pager# [phone]

## 2018-04-18 NOTE — Progress Notes (Signed)
Changes made per RT PDP (patient driven protocol). Full assessment attached below. If you have any questions please contact the Charge Therapist at [phone]. Thank you.      04/18/18 1039   Patient Assessment   Status Completed   RT Priority 3   Pulmonary History Asthma;Pneumonia   Surgical Status None   Mobility Ambulatory with or without assistance   CXR new focal opacitiy in the RUL   Cough Non-productive   Heart Rate 82   Resp Rate 18   Bilateral Breath Sounds Diminished   Home regimen   Home Treatments y   Home Oxygen n   Home CPAP/BiLevel n   Oxygen Therapy   SpO2 94 %   O2 Device Nasal cannula   O2 Flow Rate (L/min) 1 L/min   Incentive Spirometry   Incentive Spirometry Achieved (mL) 1000 mL   Incentive Spirometry Goal (mL) 460 mL   Criteria for Therapy   Secretion Clearance (BPH) None   Lung Expansion None   Respiratory Medications Patient receives maintenance therapy at home   Oxygen Mild/Moderate: O2 < or equal to 5 lpm NC to maintain SpO2> or equal to 90%   Severity Index   Severity Index NONE   Outcomes   Respiratory medication Patient is on maintenance or home regimen   Oxygen goals Goals not met   Recommendations   Respiratory Medications Change to self-admin   Performing Departments   O2 Device performing department formula [id number]   Resp assess adult performing department formula [id number]   MDI Treatment performing department formula [id number]

## 2018-04-18 NOTE — Progress Notes (Signed)
[facility] HOSPITALIST PROGRESS NOTE    Date Time: 04/18/18 6:26 PM  Patient Name: Chelsea Montoya  Attending Physician: Andrew Au, MD      History of Presenting Illness and Interval History/24 hour Events:   This is a 62 y/o female with a a pmhz of Crohns dz, HTN and a h/o cervical myelopathy with disc herniation of C3-C7 who is s/p anterior cervical diskcetomy and fusion on  7/3 who presents with left posterior shoulder blade rash and pain * 1 week--    Patient defines the pain as sharp and also " bone pain"-- 10/10. Its located at the inferior portion of the left scapular and mid vertebral area and at times on the right shoulder as well. She denies and SOB or palpitations.  She states she had some pain when visiting [facility] with her family and usually she can take an oxycodone to control but this pain is significantly worse with NO relief with 2 oxycodne which is very rare. Since the pain became more intense she decided to come to the ER.    She denies any fever or chills. No trauma to the area.SHe denies cough or sick contacts.  She has been undergoing Occupational therapy since surgery and is very happy with how strong her right arm has gotten. Prior to the onset of this pain she states that she was doing well and felt pretty good.    She has Crohns but is on no prednisone or immunosuppressants. She has never had the chicken pox, no shingles and cannot recall receiving the Varicella vaccine.  10/22: several attempts to place 18 degrees IV line was unsuccessful.  CTA could not be done because of that.  I am patient denies any chest pain or shortness of breath-however, she has been frequently desaturating to upper 80s..  She complains of some cough.Complains of severe pain with minimal activity   10/23: Patient denies shortness of breath, chest pain- post nasal drip, ? Allergic cough.Spiked temperature this am.Still intermittently desaturating.Pain is better controlled but has been sleeping a lot.  10/24:  Patient is unhappy because her pain is not controlled. Yesterday patient was drowsy and slpeepy so dose of dilaudid was reduced.Will add toradol and increase the dose of neurontin.    Physical Exam:     Vitals:    04/18/18 1639   BP: 118/59   Pulse: 90   Resp: 18   Temp: 99.4 F (37.4 C)   SpO2: 96%       Intake and Output Summary (Last 24 hours) at Date Time    Intake/Output Summary (Last 24 hours) at 04/18/2018 1826  Last data filed at 04/18/2018 0500  Gross per 24 hour   Intake 800 ml   Output -   Net 800 ml       General: awake, alert, oriented x 3; sleepy but easily arousable, ill appearing   HEENT: perrla, eomi, sclera anicteric  oropharynx clear without lesions, mucous membranes moist  Neck: supple, no lymphadenopathy, no thyromegaly, no JVD, no carotid bruits  Cardiovascular: regular rate and rhythm, no murmurs, rubs or gallops  Lungs: diminished breath sounds bilaterally   Abdomen: soft, non-tender, non-distended; no palpable masses, no hepatosplenomegaly, normoactive bowel sounds, no rebound or guarding  Extremities: no clubbing, cyanosis, or edema  Neuro: cranial nerves grossly intact, strength 5/5 in upper and lower extremities, sensation intact,   Skin: small blisters at the back.      Medications:     Current Facility-Administered Medications  Medication Dose Route Frequency   . buPROPion XL  300 mg Oral Daily   . cefTRIAXone  1 g Intravenous Q24H   . enoxaparin  40 mg Subcutaneous Daily   . fluticasone  1 spray Each Nare Daily   . fluticasone furoate-vilanterol  1 puff Inhalation QAM   . gabapentin  300 mg Oral Q8H SCH   . lidocaine  1 patch Transdermal Q24H   . melatonin  6 mg Oral QHS   . sertraline  25 mg Oral Daily   . valACYclovir HCL  1,000 mg Oral TID         Labs:     Results     Procedure Component Value Units Date/Time    Varicella Zoster (VZV) antibody, IgM [[id number]] Collected:  04/16/18 0033    Specimen:  Blood Updated:  04/18/18 1645     Varicella Zoster Antibody IgM Negative    Basic  Metabolic Panel [[id number]]  (Abnormal) Collected:  04/18/18 0421    Specimen:  Blood Updated:  04/18/18 0540     Glucose 86 mg/dL      BUN 6.0 mg/dL      Creatinine 0.6 mg/dL      Calcium 8.7 mg/dL      Sodium 782 mEq/L      Potassium 4.1 mEq/L      Chloride 102 mEq/L      CO2 27 mEq/L     GFR [[id number]] Collected:  04/18/18 0421     Updated:  04/18/18 0540     EGFR >60.0    CBC without differential [[id number]] Collected:  04/18/18 0421    Specimen:  Blood Updated:  04/18/18 0505     WBC 9.07 x10 3/uL      Hgb 11.8 g/dL      Hematocrit 52.8 %      Platelets 260 x10 3/uL      RBC 4.12 x10 6/uL      MCV 89.6 fL      MCH 28.6 pg      MCHC 32.0 g/dL      RDW 14 %      MPV 9.4 fL      Nucleated RBC 0.0 /100 WBC      Absolute NRBC 0.00 x10 3/uL     CULTURE BLOOD AEROBIC AND ANAEROBIC [[id number]] Collected:  04/17/18 2113    Specimen:  Blood, Venipuncture Updated:  04/17/18 2336    Narrative:       The order will result in two separate 8-43ml bottles  Please do NOT order repeat blood cultures if one has been  drawn within the last 48 hours.  AVOID BLOOD CULTURE DRAWS FROM CENTRAL LINE IF POSSIBLE  Indications:->Fever of greater than 101.5  1 BLUE+1 PURPLE    CULTURE BLOOD AEROBIC AND ANAEROBIC [[id number]] Collected:  04/17/18 1929    Specimen:  Blood, Venipuncture Updated:  04/17/18 2336    Narrative:       The order will result in two separate 8-57ml bottles  Please do NOT order repeat blood cultures if one has been  drawn within the last 48 hours.  AVOID BLOOD CULTURE DRAWS FROM CENTRAL LINE IF POSSIBLE  Indications:->Fever of greater than 101.5  1 BLUE+1 PURPLE            Radiology:     Radiology Results (24 Hour)     Procedure Component Value Units Date/Time    CT Chest WO Contrast [[id number]] Collected:  04/17/18 2214    Order Status:  Completed Updated:  04/17/18 2222    Narrative:       CT of the chest without intravenous contrast    CLINICAL INFORMATION: Desaturation.    PROCEDURE: CT of the chest was performed  without intravenous contrast.    FINDINGS:    No prior chest CTs are available for comparison. Comparison is made to a  prior thoracic spine CT dated 04/15/2018. There is interval development  of focal opacity within the dependent portion of the right upper lobe.  Differential diagnosis would include infection. Some linear atelectasis  is noted within the lower lobes. Spinal fixation hardware is noted.  Degenerative changes of the spine are noted. There is no pleural or  pericardial effusion. No axillary hilar or mediastinal lymphadenopathy  is seen.      Impression:        New focal opacity in the dependent portion of the right  upper lobe.    Rocky Crafts, MD   04/17/2018 10:18 PM            Assessment/plan:   # ? Infection at previous Cervical operative site  -- Dr. Kizzie Bane - Ortho Spine- input noted   -- MRI and CT scan of cervical and thoracici spine w/o contrast>> no new findings   -- elevated inflammatory markers--- trend  -Appreciate Dr Kizzie Bane' input     # Shingles with severe pain:  --contact precautions>> will d.c as only one dermatome involved per ID  --Continue valtrex x 7 days   --monitor area of skin frequently  -increase  Neurontin and continue lidocaine, dilaudid and add toradol.  -PT/OT      # ?Allergic Reaction  --improved     # intermittent desaturation/Hypoxia/Pneumonia :  --  D dimer mildly elevated   -- LE dopplers negative and V/q scan with low probability for PE  --  CTA or CT of chest could not be done due to lack of good iv access.  -CT chest with focal opacity in the dependent portion of right upper lobe   -IS and duo nebs   -No h/o smoking but has h/o asthma  -continue ceftriaxone   -blood culture is in process     DVT prophylaxis- lovenox/ SCD    # PT/OT >> home with supervison  FULL CODE    Anticipated discharge disposition and date:1-2 days     Signed by: Andrew Au, MD   UE:[id number], Cyndra Numbers, MD

## 2018-04-18 NOTE — Progress Notes (Signed)
ID PROGRESS NOTE      Spectra: [phone]    Office: [phone]    Date Time: 04/18/18 3:44 PM  Patient Name: Chelsea Montoya      Updated problem List   Acute Problem List:  Neck/back pain  -- 10/22 MRI Cervical and thoracic spine - fluid collection resolved, no acute abnormality, Multilevel degenerative changes as described, possibly mildly progressed from prior exam    -- s/p 12/17/17 - Anterior/Posterior Cervical Discectomy/Fusion    Rash  -- vesicles on mid left scapula    Fever 10/23 early am  -- chest CT with RUL opacity    ID History:  July 2019  Treated for Epidural abscess  -- 01/01/18 Cervical CT - progressive edema/fluid involving the operative bed involving the cervical soft tissues on the left, with progressive prevertebral soft tissue swelling diffusely  --S/P I&D7/9/19Cervical Wound I&Montoya; purulence encountered    Chronic Conditions:  Crohn's  Mitral valve prolapse  Anxiety   Asthma     All: cipro (rash)    Assessment:   Admitted with weakness and chest discomfort. Noted to have the posterior chest lesion and started on oral valtrex.  Last night, a CT showed alveolar infiltrates and a fever and a new diagnosis made of pneumonia.  She received a dose of ceftriaxone and is feeling better.  No change in the focal skin lesion.  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  Currently not toxic but still weak.    Recommendations:   Continue both valtrex and ceftriaxone  Close clinical follow up.    Antibiotics:   #3 Valtrex 1g PO TID  #1 ceftriaxone 1 gram IV daily    IV lines:   piv    Family History:     Family History   Problem Relation Age of Onset   . Ovarian cancer Paternal Aunt 60        died of complications of ovarian ca   . Lung cancer Mother         complications of lung ca   . Kidney failure Father    . Ovarian cancer Paternal Aunt         survivor   . Uterine cancer Maternal Grandmother 50        survivior   . Breast cancer Paternal Grandmother    . Colon cancer  Neg Hx        Social History:     Social History     Socioeconomic History   . Marital status: Married     Spouse name: Not on file   . Number of children: Not on file   . Years of education: Not on file   . Highest education level: Not on file   Occupational History   . Not on file   Social Needs   . Financial resource strain: Not on file   . Food insecurity:     Worry: Not on file     Inability: Not on file   . Transportation needs:     Medical: Not on file     Non-medical: Not on file   Tobacco Use   . Smoking status: Never Smoker   . Smokeless tobacco: Never Used   Substance and Sexual Activity   . Alcohol use: Yes     Comment: occasionally   . Drug use: No   . Sexual activity: Yes     Partners: Male     Birth control/protection: None   Lifestyle   .  Physical activity:     Days per week: Not on file     Minutes per session: Not on file   . Stress: Not on file   Relationships   . Social connections:     Talks on phone: Not on file     Gets together: Not on file     Attends religious service: Not on file     Active member of club or organization: Not on file     Attends meetings of clubs or organizations: Not on file     Relationship status: Not on file   . Intimate partner violence:     Fear of current or ex partner: Not on file     Emotionally abused: Not on file     Physically abused: Not on file     Forced sexual activity: Not on file   Other Topics Concern   . Not on file   Social History Narrative   . Not on file       Allergies:     Allergies   Allergen Reactions   . Flexeril [Cyclobenzaprine] Nausea And Vomiting     Flu like symptoms   . Darvon Other (See Comments)     Gastric distress     . Latex    . Augmentin [Amoxicillin-Pot Clavulanate] Nausea And Vomiting   . Ciprofloxacin Rash   . Keflex [Cephalexin] Other (See Comments)     Gastric upset.       Review of Systems:   Still with some chest discomfort. Wearing O2 via nasal prongs. No cough. Fever down. No new rash.    Physical Exam:   BP 119/63    Pulse 85   Temp 99 F (37.2 C) (Oral)   Resp 17   Ht 1.524 m (5')   Wt 82.1 kg (181 lb)   SpO2 94%   BMI 35.35 kg/m   Awake, alert, no distress, cooperative.  Weak.  HENT normal. Mouth moist, face symmetric  Lungs coarse breath sounds, without rales, wheezing  Cv normal without murmur, rub, or gallop  Thorax with posterior lesion near left scapula.  Still localized  Abdomen soft nontender, without organomegaly or masses.   Extrem normal without phlebitis or edema.   No rash.   Neurologic exam non-focal.   Normal mental status.      Select labs:     Recent Labs   Lab 04/18/18  0421   WBC 9.07   Hgb 11.8   Hematocrit 36.9   Platelets 260     Recent Labs   Lab 04/18/18  0421   Sodium 139   Potassium 4.1   Chloride 102   CO2 27   BUN 6.0*   Creatinine 0.6   Calcium 8.7   Glucose 86        Rads:   Chest CT:  New focal opacity in the dependent portion of the right  upper lobe.    Attestations:     I have considered the potential drug interactions between antimicrobial agents   I have recommended and other medications required by the patient and adjusted appropriately for renal function.  I have given thought to the complex medical conditions present and have endeavored to balance the interventions required by the acute conditions with the potential toxicities of the medications and procedures on the patient's well being and on the status of the other chronic conditions.  I have engaged considerations and discussions about short and long term prognosis. I have  discussed my thoughts with the patient and with relevant medical team members.  A total of 35  minutes were spent in the care of this patient.    Signed by: Guadalupe Maple, MD

## 2018-04-18 NOTE — Progress Notes (Signed)
Pt a/ox4 slightly febrile 100.2 tylenol given  fc mae c/o severe back pain dilaudid given with some relief  On 1L NC sating 96%, was on 1.5L sating 98%, pt placed to 1L O2 while asleep  Encouraged to use IS, pt compliant  Standby assist to BR, voided adequately, last BM 10/22 per pt report  Ct chest without contrast done  Blood culture sent, results pending

## 2018-04-18 NOTE — OT Progress Note (Signed)
[facility]   Occupational Therapy Treatment     Patient: Chelsea Montoya    MRN#: [id number]   Unit: [facility] TOWER 7  Bed: H086/V784.69      Discharge Recommendations:   Discharge Recommendation: (Home w/ supervision and ADL assist as needed, HHOT)   DME Recommended for Discharge: Shower chair, BSC, Front wheel walker    If (Home w/ supervision and ADL assist as needed, HHOT) recommended discharge disposition is not available, patient will need rehab.      Discharge recommendations are subject to change based on patient's progress. Please refer to the most recent treatment note for the most up to date recommendation.    Assessment:   Pt tolerated OT session well. Reviewed log rolling technique for pain management w/ bed mobility, patient able to perform w/ CGA and extended time. Pt w/ much safer functional ambulation using RW, requiring SBA. Pt able to complete toilet transfer w/ standard height toilet w/ SBA using RW, however would recommend BSC for ease of toileting and safety at night. Pt states that she may have BSC and RW at home, however might need to get them. Pt would continue to benefit from skilled OT services in order to increase independence and safety in ADLs and functional mobility.    Treatment Activities: ADL retraining, functional transfers, bed mobility, functional ambulation, activity tolerance training, energy conservation, pain management    Educated the patient to role of occupational therapy, plan of care, goals of therapy and safety with mobility and ADLs, energy conservation techniques, home safety.    Plan:    OT Frequency Recommended: 2-3x/wk     Continue plan of care.       Precautions and Contraindications:   Falls      Updated Medical Status/Imaging/Labs:  Reviewed     Subjective: "I know I can't do much, but I'll try"   Patient's medical condition is appropriate for Occupational Therapy intervention at this time.  Patient is agreeable to participation in  the therapy session. Nursing clears patient for therapy.    Pain:   Scale: 6/10 per wong baker FACES scale   Location: Back  Intervention: Pt repositioned, provided ice pack, RN aware     Objective:   Patient is in bed with telemetry and peripheral IV, O2 at 1 liters/minute via nasal cannula in place.      Cognition  Alert/Oriented: A&Ox4  Following Commands: Able to follow simple commands  Behavior: Pleasant and cooperative   Attention: WFL  Coordination: WFL  Safety Awareness: Educated on Geologist, engineering Mobility  Supine to Sit: CGA w/ log roll technique, vc for technique   Sit to Stand: SBA using RW  Transfers: SBA using RW    PMP Activity: Step 6 - Walks in Room    Balance  Static Sitting: Independent   Dynamic Sitting: Supervision  Static Standing: SBA using RW  Dynamic Standing: SBA using RW    Self Care and Home Management  Eating: Set up   Grooming: SBA standing sinkside w/ RW  UE Dressing: Min assist to Public affairs consultant   LE Dressing: Max assist to don/doff socks   Toileting: Supervision, SBA using RW for toilet transfer     Therapeutic Exercises  Incorporated throughout session     Participation: good  Endurance: fair+    Patient left with call bell within reach, all needs met, SCDs off as found, fall mat in place, bed alarm n/a, chair alarm  ON and all questions answered. RN notified of session outcome and patient response.     Goals:  Time For Goal Achievement: 3 visits  ADL Goals  Patient will groom self: Supervision, at sinkside, 3 visits  Patient will dress lower body: Supervision, 3 visits  Patient will toilet: Supervision, 3 visits  Mobility and Transfer Goals  Pt will perform functional transfers: Supervision, 3 visits(w/ LRAD)                         Drucie Ip, OTD, OTR/L  Pager [phone]      Time of Treatment  OT Received On: 04/18/18  Start Time: 1424  Stop Time: 1457  Time Calculation (min): 33 min    Treatment # 1 of 3 visits

## 2018-04-18 NOTE — Plan of Care (Signed)
Problem: Safety  Goal: Patient will be free from injury during hospitalization  Outcome: Progressing  Flowsheets (Taken 04/16/2018 0441 by Britt Bottom, RN)  Patient will be free from injury during hospitalization : Assess patient's risk for falls and implement fall prevention plan of care per policy;Use appropriate transfer methods;Provide and maintain safe environment;Ensure appropriate safety devices are available at the bedside;Include patient/ family/ care giver in decisions related to safety;Hourly rounding     Problem: Pain  Goal: Pain at adequate level as identified by patient  Outcome: Progressing  Flowsheets (Taken 04/18/2018 1810 by Learta Codding, RN)  Pain at adequate level as identified by patient: Assess pain on admission, during daily assessment and/or before any "as needed" intervention(s);Reassess pain within 30-60 minutes of any procedure/intervention, per Pain Assessment, Intervention, Reassessment (AIR) Cycle;Evaluate if patient comfort function goal is met;Evaluate patient's satisfaction with pain management progress;Offer non-pharmacological pain management interventions;Consult/collaborate with Pain Service;Consult/collaborate with Physical Therapy, Occupational Therapy, and/or Speech Therapy;Include patient/patient care companion in decisions related to pain management as needed     Problem: Psychosocial and Spiritual Needs  Goal: Demonstrates ability to cope with hospitalization/illness  Outcome: Progressing  Flowsheets (Taken 04/18/2018 1810 by Learta Codding, RN)  Demonstrates ability to cope with hospitalizations/illness: Encourage verbalization of feelings/concerns/expectations;Provide quiet environment;Assist patient to identify own strengths and abilities;Reinforce positive adaptation of new coping behaviors;Encourage participation in diversional activity;Include patient/ patient care companion in decisions;Encourage patient to set small goals for self      NURSING  PROGRESS NOTE    Patient Name: Chelsea Montoya (62 y.o. female)  Admission Date: 04/15/2018 [facility] Day 3)    Shift Note: Ox4, MAE, follows commands, VSS, 2L NC for desatting, safety checks performed    Recent Labs   Lab 04/18/18  0421   Sodium 139   Potassium 4.1   Chloride 102   CO2 27   BUN 6.0*   Creatinine 0.6   EGFR >60.0   Glucose 86   Calcium 8.7       Recent Labs   Lab 04/18/18  0421   WBC 9.07   Hgb 11.8   Hematocrit 36.9   Platelets 260         Patient Lines/Drains/Airways Status      Active Lines, Drains and Airways       Name:   Placement date:   Placement time:   Site:   Days:    Peripheral IV 04/17/18 Left Forearm   04/17/18    2300    Forearm   1                    MEWS Score: 1    Last BM:   Pending Orders: na  Discharge Plan: home    Safety Checklist    Fall Precautions y    Avasys n    Seizure Precautions n    Aspiration Precautions n       Interpreter Services:  Does the patient require an Interpreter? no    If yes, what form of interpreter services was used? na     If family was utilized, is interpreter waiver form signed and in the chart? na

## 2018-04-18 NOTE — Plan of Care (Addendum)
NURSING PROGRESS NOTE    Patient Name: Chelsea Montoya (62 y.o. female)  Admission Date: 04/15/2018 [facility] Day 3)    Shift Note: Pt is A/O x 4, follows command, MAE,  Sensation intact, denies any HA, no nausea or vomiting.  Continues on 02 2 L via NC. Blister on back improved, continues on Valtrex.     Pain: Back pain persists Described the pain as tightness in the back. Pain regimen changes: Gabapentin dose increased to 300 mg Q 8 hours from 200 mg Q 8 H. Toradol 15 mg IV ordered to be administered as needed for pain. Dilaudid 2 mg given x 3 this shift, last at 1830       Diagnosed with PNA and started on Rocephin Q 24 hours.  Encouraged and teaching done on Incentive spirometer use    Recent Labs   Lab 04/18/18  0421   Sodium 139   Potassium 4.1   Chloride 102   CO2 27   BUN 6.0*   Creatinine 0.6   EGFR >60.0   Glucose 86   Calcium 8.7       Recent Labs   Lab 04/18/18  0421   WBC 9.07   Hgb 11.8   Hematocrit 36.9   Platelets 260         Patient Lines/Drains/Airways Status      Active Lines, Drains and Airways       Name:   Placement date:   Placement time:   Site:   Days:    Peripheral IV 04/17/18 Left Forearm   04/17/18    2300    Forearm   less than 1                    MEWS Score: 1    Last BM:   Pending Orders: Labs  Discharge Plan: TBD    Safety Checklist    Fall Precautions Y    Avasys {N    Seizure Precautions Y    Aspiration Precautions N       Interpreter Services:  Does the patient require an Interpreter?N    If yes, what form of interpreter services was used? NA    If family was utilized, is interpreter waiver form signed and in the chart? NA    Problem: Psychosocial and Spiritual Needs  Goal: Demonstrates ability to cope with hospitalization/illness  Outcome: Progressing  Flowsheets (Taken 04/18/2018 1810)  Demonstrates ability to cope with hospitalizations/illness: Encourage verbalization of feelings/concerns/expectations; Provide quiet environment; Assist patient to identify own strengths and  abilities; Reinforce positive adaptation of new coping behaviors; Encourage participation in diversional activity; Include patient/ patient care companion in decisions; Encourage patient to set small goals for self     Problem: Inadequate Gas Exchange  Goal: Adequate oxygenation and improved ventilation  Outcome: Progressing  Flowsheets (Taken 04/18/2018 1810)  Adequate oxygenation and improved ventilation: Assess lung sounds; Monitor SpO2 and treat as needed; Provide mechanical and oxygen support to facilitate gas exchange; Position for maximum ventilatory efficiency; Teach/reinforce use of incentive spirometer 10 times per hour while awake, cough and deep breath as needed; Plan activities to conserve energy: plan rest periods; Increase activity as tolerated/progressive mobility; Consult/collaborate with Respiratory Therapy     Problem: Inadequate Tissue Perfusion-Venous  Goal: Tissue perfusion is adequate-venous  Outcome: Progressing  Flowsheets (Taken 04/18/2018 1810)  Tissue perfusion is adequate-venous : Increase activity as tolerated / progressive mobility; Elevate feet when in chair; Teach/review/reinforce ankle pump exercises; VTE  prevention:  Administer anticoagulant(s) and/or apply anti-embolism stockings/devices as ordered

## 2018-04-19 DIAGNOSIS — B029 Zoster without complications: Principal | ICD-10-CM

## 2018-04-19 DIAGNOSIS — R0902 Hypoxemia: Secondary | ICD-10-CM

## 2018-04-19 MED ORDER — FLUTICASONE FUROATE-VILANTEROL 100-25 MCG/INH IN AEPB
1.00 | INHALATION_SPRAY | Freq: Every morning | RESPIRATORY_TRACT | Status: DC
Start: 2018-04-20 — End: 2018-04-19

## 2018-04-19 MED ORDER — CEFDINIR 300 MG PO CAPS
300.00 mg | ORAL_CAPSULE | Freq: Two times a day (BID) | ORAL | 0 refills | Status: AC
Start: 2018-04-19 — End: 2018-04-24

## 2018-04-19 MED ORDER — GABAPENTIN 300 MG PO CAPS
300.00 mg | ORAL_CAPSULE | Freq: Three times a day (TID) | ORAL | 0 refills | Status: DC
Start: 2018-04-19 — End: 2019-05-27

## 2018-04-19 MED ORDER — VALACYCLOVIR HCL 1 G PO TABS
1000.00 mg | ORAL_TABLET | Freq: Three times a day (TID) | ORAL | 0 refills | Status: AC
Start: 2018-04-19 — End: 2018-04-23

## 2018-04-19 MED ORDER — NYSTATIN 100000 UNIT/GM EX POWD
Freq: Two times a day (BID) | CUTANEOUS | Status: DC
Start: 2018-04-19 — End: 2018-04-19
  Filled 2018-04-19: qty 15

## 2018-04-19 MED ORDER — LIDOCAINE 5 % EX PTCH
1.00 | MEDICATED_PATCH | CUTANEOUS | 0 refills | Status: AC
Start: 2018-04-19 — End: ?

## 2018-04-19 MED ORDER — NYSTATIN 100000 UNIT/GM EX POWD
Freq: Two times a day (BID) | CUTANEOUS | 2 refills | Status: DC
Start: 2018-04-19 — End: 2020-06-22

## 2018-04-19 MED ORDER — HYDROMORPHONE HCL 2 MG PO TABS
2.0000 mg | ORAL_TABLET | ORAL | 0 refills | Status: AC | PRN
Start: 2018-04-19 — End: 2018-04-26

## 2018-04-19 MED ORDER — NALOXONE HCL 4 MG/0.1ML NA LIQD
NASAL | 0 refills | Status: DC
Start: 2018-04-19 — End: 2019-05-27

## 2018-04-19 NOTE — Discharge Instr - AVS First Page (Signed)
Reason for your Hospital Admission:  Pneumonia   Shingles         Instructions for after your discharge:  Keep your appointment with Dr Kizzie Bane

## 2018-04-19 NOTE — Progress Notes (Signed)
DISCHARGE NOTE      Patient: Chelsea Montoya, Chelsea Montoya (62 y.o., 1956-01-20)  Admission Date: 04/15/2018 (LOS: 4)    Discharge Date: 04/19/18  Discharge Disposition: Home  Report Given: N  Transportation Method: Car  Additional Discharge Info: Pt is A/O x 4, follows command, MAE,  Sensation intact, denies any HA, no nausea or vomiting. Off 02 and STAT levels 94-95% RA. Blister on back improved. Back pain persists Described the pain as tightness in the back, but better managed on current med's per pt.    Pt is being discharged home today. Discharge summary reviewed. Needed rx given to pt (Dilaudid, Gabapentin and Nystatin powder). Letter for work given as well. All personal belongings taken. All questions answered. Pt transported off the floor via wheelchair    Discharge Medication List       Medication List      START taking these medications    cefdinir 300 MG capsule  Commonly known as:  OMNICEF  Take 1 capsule (300 mg total) by mouth 2 (two) times daily for 5 days     gabapentin 300 MG capsule  Commonly known as:  NEURONTIN  Take 1 capsule (300 mg total) by mouth every 8 (eight) hours     HYDROmorphone 2 MG tablet  Commonly known as:  DILAUDID  Take 1 tablet (2 mg total) by mouth every 4 (four) hours as needed (pain)     lidocaine 5 %  Commonly known as:  LIDODERM  Place 1 patch onto the skin every 24 hours Remove & Discard patch within 12 hours or as directed by MD     naloxone 4 MG/0.1ML nasal spray  Commonly known as:  NARCAN  1 spray intranasally. If pt does not respond or relapses into respiratory depression call 911. Give additional doses every 2-3 min.     valacyclovir 1000 MG tablet  Commonly known as:  VALTREX  Take 1 tablet (1,000 mg total) by mouth 3 (three) times daily for 4 days        CONTINUE taking these medications    ADVAIR DISKUS 250-50 MCG/DOSE Aepb  Generic drug:  fluticasone-salmeterol     ALPRAZolam 0.5 MG tablet  Commonly known as:  XANAX     AMBIEN 10 MG tablet  Generic drug:  zolpidem      buPROPion XL 300 MG 24 hr tablet  Commonly known as:  WELLBUTRIN XL     CLARITIN-D 12 HOUR PO     dicyclomine 20 MG tablet  Commonly known as:  BENTYL     melatonin 3 mg tablet     PROAIR HFA 108 (90 Base) MCG/ACT inhaler  Generic drug:  albuterol     sertraline 50 MG tablet  Commonly known as:  ZOLOFT        STOP taking these medications    oxyCODONE 5 MG capsule  Commonly known as:  OXY-IR           Where to Get Your Medications      These medications were sent to [facility]  [address], [place] [address]    Hours:  Monday - Friday 8AM to 8PM, Saturday - Sunday 9A to 5PM Phone:  [phone]    cefdinir 300 MG capsule   lidocaine 5 %   naloxone 4 MG/0.1ML nasal spray   valacyclovir 1000 MG tablet     You can get these medications from any pharmacy    Bring a paper prescription for each  of these medications   gabapentin 300 MG capsule   HYDROmorphone 2 MG tablet

## 2018-04-19 NOTE — Progress Notes (Signed)
Met c pt who states prefers to do outpt PT rather than home health as she does not plan to stay indoors more than a few days and overall feels better than on admission.  Encouraged to f/u c PCP re interest in continuing c current outpt PT.  Also states concerned re desaturation O2, but no O2 recommendation and per RN pt off O2 all day.  Pt states using IS as ordered and states will f/u c pulmonologist if concern for need for O2.  States [place] pulse ox but in other state and will buy online for prn monitoring, and reminded if feels any serious concerns about health to call 911.  Case manager notified via TC.  Pt has no HH needs at this time.    Ladell Pier, RN BSN  Post Acute Care Coordinator  [phone]

## 2018-04-19 NOTE — Progress Notes (Signed)
ID PROGRESS NOTE      Spectra: [phone]    Office: [phone]    Date Time: 04/19/18 @NOW   Patient Name: Chelsea Montoya, Chelsea Montoya    Problem List:      Acute Problem List:  Neck/back pain  -- 10/22 MRI Cervical and thoracic spine - fluid collection resolved, no acute abnormality, Multilevel degenerative changes as described, possibly mildly progressed from prior exam    -- s/p 12/17/17 - Anterior/Posterior Cervical Discectomy/Fusion    Rash  -- vesicles on mid left scapula    Fever 10/23 early am  -- chest CT with RUL opacity    ID History:  July 2019  Treated for Epidural abscess  -- 01/01/18 Cervical CT - progressive edema/fluid involving the operative bed involving the cervical soft tissues on the left, with progressive prevertebral soft tissue swelling diffusely  --S/P I&D7/9/19Cervical Wound I&D; purulence encountered    Chronic Conditions:  Crohn's  Mitral valve prolapse  Anxiety   Asthma     All: cipro (rash)    Assessment:     Completing course for possible VZV    Also possible PNA    Doing well afebrile        Antimicrobials:   #4Valtrex 1g PO TID  #2 ceftriaxone 1 gram IV daily    Plan:     Complete 7 day course of Valtrex  Ok to transition Ceftriaxone to [place] - complete 5 day course    Ok to discharge from ID standpoint    Discussed with Dr. Wilburt Finlay     Lines:     piv    Family History:     Family History   Problem Relation Age of Onset   . Ovarian cancer Paternal Aunt 60        died of complications of ovarian ca   . Lung cancer Mother         complications of lung ca   . Kidney failure Father    . Ovarian cancer Paternal Aunt         survivor   . Uterine cancer Maternal Grandmother 50        survivior   . Breast cancer Paternal Grandmother    . Colon cancer Neg Hx        Social History:     Social History     Socioeconomic History   . Marital status: Married     Spouse name: Not on file   . Number of children: Not on file   . Years of education: Not on file   . Highest  education level: Not on file   Occupational History   . Not on file   Social Needs   . Financial resource strain: Not on file   . Food insecurity:     Worry: Not on file     Inability: Not on file   . Transportation needs:     Medical: Not on file     Non-medical: Not on file   Tobacco Use   . Smoking status: Never Smoker   . Smokeless tobacco: Never Used   Substance and Sexual Activity   . Alcohol use: Yes     Comment: occasionally   . Drug use: No   . Sexual activity: Yes     Partners: Male     Birth control/protection: None   Lifestyle   . Physical activity:     Days per week: Not on file     Minutes per session:  Not on file   . Stress: Not on file   Relationships   . Social connections:     Talks on phone: Not on file     Gets together: Not on file     Attends religious service: Not on file     Active member of club or organization: Not on file     Attends meetings of clubs or organizations: Not on file     Relationship status: Not on file   . Intimate partner violence:     Fear of current or ex partner: Not on file     Emotionally abused: Not on file     Physically abused: Not on file     Forced sexual activity: Not on file   Other Topics Concern   . Not on file   Social History Narrative   . Not on file       Allergies:     Allergies   Allergen Reactions   . Flexeril [Cyclobenzaprine] Nausea And Vomiting     Flu like symptoms   . Darvon Other (See Comments)     Gastric distress     . Latex    . Augmentin [Amoxicillin-Pot Clavulanate] Nausea And Vomiting   . Ciprofloxacin Rash   . Keflex [Cephalexin] Other (See Comments)     Gastric upset.       Review of Systems:   Afebrile  No new issues overnight      Physical Exam:     Vitals:    04/19/18 1107   BP: 129/74   Pulse: 97   Resp: 19   Temp: 99.3 F (37.4 C)   SpO2: 92%       General Appearance: NAD    Labs:     Lab Results   Component Value Date    WBC 9.07 04/18/2018    HGB 11.8 04/18/2018    HCT 36.9 04/18/2018    MCV 89.6 04/18/2018    PLT 260 04/18/2018      Lab Results   Component Value Date    CREAT 0.6 04/18/2018     Lab Results   Component Value Date    ALT 16 01/05/2018    AST 30 01/05/2018    ALKPHOS 63 01/05/2018    BILITOTAL 0.4 01/05/2018     No results found for: LACTATE    Microbiology:     Microbiology Results     Procedure Component Value Units Date/Time    CULTURE BLOOD AEROBIC AND ANAEROBIC [[id number]] Collected:  04/17/18 1929    Specimen:  Blood, Venipuncture Updated:  04/19/18 0021    Narrative:       The order will result in two separate 8-30ml bottles  Please do NOT order repeat blood cultures if one has been  drawn within the last 48 hours.  AVOID BLOOD CULTURE DRAWS FROM CENTRAL LINE IF POSSIBLE  Indications:->Fever of greater than 101.5  ORDER#: [id number]                                    ORDERED BY: PHAYAL, ARUNA  SOURCE: Blood, Venipuncture                          COLLECTED:  04/17/18 19:29  ANTIBIOTICS AT COLL.:  RECEIVED :  04/17/18 23:36  Culture Blood Aerobic and Anaerobic        PRELIM      04/19/18 00:21  04/19/18   No Growth after 1 day/s of incubation.      CULTURE BLOOD AEROBIC AND ANAEROBIC [[id number]] Collected:  04/17/18 2113    Specimen:  Blood, Venipuncture Updated:  04/19/18 0021    Narrative:       The order will result in two separate 8-14ml bottles  Please do NOT order repeat blood cultures if one has been  drawn within the last 48 hours.  AVOID BLOOD CULTURE DRAWS FROM CENTRAL LINE IF POSSIBLE  Indications:->Fever of greater than 101.5  ORDER#: [id number]                                    ORDERED BY: PHAYAL, ARUNA  SOURCE: Blood, Venipuncture                          COLLECTED:  04/17/18 21:13  ANTIBIOTICS AT COLL.:                                RECEIVED :  04/17/18 23:36  Culture Blood Aerobic and Anaerobic        PRELIM      04/19/18 00:21  04/19/18   No Growth after 1 day/s of incubation.            Rads:   No results found.    Signed by: Claiborne Rigg, MD

## 2018-04-19 NOTE — Discharge Summary (Signed)
[facility]PITAL[facility] DISCHARGE SUMMARY    Date Time: 04/19/18 4:28 PM  Patient Name: Chelsea Montoya  Attending Physician: Andrew Au, MDMD    Date of Admission:   04/15/2018    Date of Discharge:   04/19/2018    Reason for Admission:   Thoracic back pain, unspecified back pain laterality, unspecified chronicity [M54.6]  Thoracic back pain, unspecified back pain laterality, unspecified chronicity [M54.6]    Problems:   Lists the present on admission hospital problems  Present on Admission:  . Thoracic back pain, unspecified back pain laterality, unspecified chronicity      Problem Lists:  Patient Active Problem List   Diagnosis   . Crohn's colitis   . History of colon resection   . Mitral valve prolapse   . Pyelonephritis   . Kidney stone   . Arrhythmia   . PGM breast cancer age 43   . paternal aunts x 2 ovarian cancer   . Prolapse of anterior vaginal wall   . HNP (herniated nucleus pulposus) with myelopathy, cervical   . S/P cervical discectomy   . Postoperative fever   . Wound infection after surgery   . Urinary incontinence   . Leukocytosis   . Fever   . Urinary retention   . History of nephrolithiasis   . Dysuria   . Thoracic back pain, unspecified back pain laterality, unspecified chronicity       Discharge Dx:   Thoracic back pain, unspecified back pain laterality, unspecified chronicity [M54.6]  Thoracic back pain, unspecified back pain laterality, unspecified chronicity [M54.6]    Consultations:   Treatment Team: Attending Provider: Andrew Au, MD; Consulting Physician: Kizzie Ide, MD; Case Manager: Derrek Gu; Respiratory Care Practitioner: Rene Kocher, RT; Respiratory Care Practitioner: Vivianne Master, RT; Registered Nurse: Learta Codding, RN    Procedures performed:     CT Chest WO Contrast   Final Result    New focal opacity in the dependent portion of the right   upper lobe.      Rocky Crafts, MD    04/17/2018 10:18 PM      XR Chest AP Portable   Final Result   . Developing  right upper lobe opacity       Marty Heck, MD    04/17/2018 3:37 PM      NM Lung Ventilation Perfusion Aerosol   Final Result    Low probability for pulmonary embolism.      Collene Schlichter, MD    04/17/2018 10:48 AM      US Venous Duplex Doppler Leg Bilateral   Final Result    No evidence of lower extremity venous thrombosis.      Wynema Birch, MD    04/16/2018 2:58 PM      MRI Thoracic Spine WO Contrast   Final Result      1. Interval resolution of the previously seen cervical prevertebral   collection. No new paraspinal or intraspinal collection.   2. No significant new or acute abnormality of the cervicothoracic spine.   3. Multilevel degenerative changes as described, possibly mildly   progressed from prior exam, with probable active inflammatory changes   along the C7-T1 facet joints, right greater than left, and degenerative   endplate marrow edema at the L1-L2 level.      Susy Frizzle, MD    04/16/2018 9:35 AM      MRI Cervical Spine WO Contrast   Final Result  1. Interval resolution of the previously seen cervical prevertebral   collection. No new paraspinal or intraspinal collection.   2. No significant new or acute abnormality of the cervicothoracic spine.   3. Multilevel degenerative changes as described, possibly mildly   progressed from prior exam, with probable active inflammatory changes   along the C7-T1 facet joints, right greater than left, and degenerative   endplate marrow edema at the L1-L2 level.      Susy Frizzle, MD    04/16/2018 9:35 AM      CT Cervical Spine WO Contrast   Final Result      1. Surgical changes related to an ACDF spanning C3-C7 which is stable in   the study interval.   2. Stable degenerative changes of the cervical spine.   3. The previously seen prevertebral collection in the cervical region no   longer identified.   4. Thoracic degenerative changes similar to the prior MR when allowing   for differences in technique.      Otho Ket, MD     04/15/2018 5:53 PM      CT Thoracic Spine WO Contrast   Final Result      1. Surgical changes related to an ACDF spanning C3-C7 which is stable in   the study interval.   2. Stable degenerative changes of the cervical spine.   3. The previously seen prevertebral collection in the cervical region no   longer identified.   4. Thoracic degenerative changes similar to the prior MR when allowing   for differences in technique.      Otho Ket, MD    04/15/2018 5:53 PM      XR Chest 2 Views   Final Result    No acute disease. Stable cardiomegaly.      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 Its located at the inferior portion of the left scapular and mid vertebral area and at times on the right shoulder as well. She denies and SOB or palpitations.  She states she had some pain when visiting [facility] with her family and usually she can take an oxycodone to control but this pain is significantly worse with NO relief with 2 oxycodne which is very rare. Since the pain became more intense she decided to come to the ER.    She denies any fever or chills. No trauma to the area.SHe denies cough or sick contacts.She has been undergoing Occupational therapy since surgery andis very happy with how strong her right arm has gotten. Prior to the onset of this pain she states that she was doing well and felt pretty good.    # Severe pain in the left scapular vertebral area due to shingles:   MRI and CT scan of cervical and thoracici spine w/o contrast>> no new findings or evidence of infection.Patient was seen by Dr Kizzie Bane and infectious disease.She was  noted to have erythema with a zoster blister at the back.  Patient was started on Valtrex for total of 7 days.  She had severe pain which was controlled with Neurontin, lidocaine, Dilaudid and IV Toradol.  Her pain is fairly controlled at the time of discharge.  Will be given a short course of Dilaudid in addition to lidocaine and Neurontin.    #intermittent desaturation/Hypoxia/Pneumonia :  -- D dimer mildly elevated.LE dopplers negative and V/q scan with low probability for PE.CT chest with focal opacity in the dependent portion of right upper lobe .  She was started on IV ceftriaxone-allergic to several medications.  Her blood cultures came back negative and her hypoxia has also improved at the time of discharge.  She is saturating well on room air.No h/o smoking but has h/o asthma    Physical exam at discharge:  Vitals:    04/19/18 1107   BP: 129/74   Pulse: 97   Resp: 19   Temp: 99.3 F (37.4 C)   SpO2: 92%     General: awake, alert, oriented x 3; sleepy but easily arousable, ill appearing   HEENT: perrla, eomi, sclera anicteric  oropharynx clear without lesions, mucous membranes moist  Neck: supple, no lymphadenopathy, no thyromegaly, no JVD, no carotid bruits  Cardiovascular: regular rate and rhythm, no murmurs, rubs or gallops  Lungs: diminished breath sounds bilaterally   Abdomen: soft, non-tender, non-distended; no palpable masses, no hepatosplenomegaly, normoactive bowel sounds, no rebound or guarding  Extremities: no clubbing, cyanosis, or edema  Neuro: cranial nerves grossly intact, strength 5/5 in upper and lower extremities, sensation intact,   Skin: small blister at the back.    Discharge Medications:        Medication List      START taking these medications    cefdinir 300 MG capsule  Commonly known as:  OMNICEF  Take 1 capsule (300 mg total) by mouth 2 (two) times daily for 5 days     gabapentin 300 MG capsule  Commonly known as:  NEURONTIN  Take 1 capsule (300 mg total) by mouth every 8  (eight) hours     HYDROmorphone 2 MG tablet  Commonly known as:  DILAUDID  Take 1 tablet (2 mg total) by mouth every 4 (four) hours as needed (pain)     lidocaine 5 %  Commonly known as:  LIDODERM  Place 1 patch onto the skin every 24 hours Remove & Discard patch within 12 hours or as directed by MD     naloxone 4 MG/0.1ML nasal spray  Commonly known as:  NARCAN  1 spray intranasally. If pt does not respond or relapses into respiratory depression call 911. Give additional doses every 2-3 min.     valacyclovir 1000 MG tablet  Commonly known as:  VALTREX  Take 1 tablet (1,000 mg total) by mouth 3 (three) times daily for 4 days        CONTINUE taking these medications    ADVAIR DISKUS 250-50 MCG/DOSE Aepb  Generic drug:  fluticasone-salmeterol     ALPRAZolam 0.5 MG tablet  Commonly known as:  XANAX     AMBIEN 10 MG tablet  Generic drug:  zolpidem     buPROPion XL 300 MG 24 hr tablet  Commonly known as:  WELLBUTRIN XL     CLARITIN-D 12 HOUR PO     dicyclomine 20 MG tablet  Commonly known as:  BENTYL     melatonin 3 mg tablet     PROAIR HFA 108 (90 Base) MCG/ACT inhaler  Generic drug:  albuterol     sertraline 50 MG tablet  Commonly known  as:  ZOLOFT        STOP taking these medications    oxyCODONE 5 MG capsule  Commonly known as:  OXY-IR           Where to Get Your Medications      These medications were sent to [facility]  [address] [place] [address]    Hours:  Monday - Friday 8AM to 8PM, Saturday - Sunday 9A to 5PM Phone:  [phone]    cefdinir 300 MG capsule   lidocaine 5 %   naloxone 4 MG/0.1ML nasal spray   valacyclovir 1000 MG tablet     You can get these medications from any pharmacy    Bring a paper prescription for each of these medications   gabapentin 300 MG capsule   HYDROmorphone 2 MG tablet          Attempted to contact PCP to update but unable to reach    [place]  [address] [facility]  [address] [place]  [address]  [phone]    Follow up in 1 week(s)          TIME SPENT:   Total time taken to go over the discharge instruction to arrange for outpatient medication to discuss the case with consultants was 35 minutes      Signed by: Andrew Au, MD    CC to: Dante Gang, MD

## 2018-09-04 ENCOUNTER — Ambulatory Visit: Payer: BLUE CROSS/BLUE SHIELD | Admitting: Obstetrics & Gynecology

## 2018-10-03 ENCOUNTER — Ambulatory Visit: Payer: BLUE CROSS/BLUE SHIELD | Admitting: Obstetrics & Gynecology

## 2019-01-01 ENCOUNTER — Other Ambulatory Visit: Payer: Self-pay | Admitting: Obstetrics & Gynecology

## 2019-01-01 ENCOUNTER — Ambulatory Visit: Payer: BLUE CROSS/BLUE SHIELD | Admitting: Obstetrics & Gynecology

## 2019-01-01 DIAGNOSIS — Z13820 Encounter for screening for osteoporosis: Secondary | ICD-10-CM

## 2019-01-01 DIAGNOSIS — Z1239 Encounter for other screening for malignant neoplasm of breast: Secondary | ICD-10-CM

## 2019-01-10 ENCOUNTER — Other Ambulatory Visit: Payer: Self-pay | Admitting: Obstetrics & Gynecology

## 2019-01-13 ENCOUNTER — Encounter: Payer: Self-pay | Admitting: Obstetrics & Gynecology

## 2019-01-20 ENCOUNTER — Telehealth: Payer: Self-pay

## 2019-01-20 NOTE — Telephone Encounter (Signed)
Informed patient of a normal DEXA and mammo.     lw

## 2019-01-21 ENCOUNTER — Encounter: Payer: Self-pay | Admitting: Obstetrics & Gynecology

## 2019-05-27 ENCOUNTER — Encounter: Payer: Self-pay | Admitting: Obstetrics & Gynecology

## 2019-05-27 ENCOUNTER — Ambulatory Visit: Payer: Commercial Managed Care - PPO | Admitting: Obstetrics & Gynecology

## 2019-05-27 VITALS — BP 120/80 | HR 100 | Temp 98.4°F | Ht 62.0 in | Wt 207.8 lb

## 2019-05-27 DIAGNOSIS — Z1231 Encounter for screening mammogram for malignant neoplasm of breast: Secondary | ICD-10-CM

## 2019-05-27 DIAGNOSIS — Z126 Encounter for screening for malignant neoplasm of bladder: Secondary | ICD-10-CM

## 2019-05-27 DIAGNOSIS — Z01419 Encounter for gynecological examination (general) (routine) without abnormal findings: Secondary | ICD-10-CM

## 2019-05-27 DIAGNOSIS — Z1151 Encounter for screening for human papillomavirus (HPV): Secondary | ICD-10-CM

## 2019-05-27 DIAGNOSIS — Z124 Encounter for screening for malignant neoplasm of cervix: Secondary | ICD-10-CM

## 2019-05-27 NOTE — Progress Notes (Signed)
Gyn Note:  Name: Chelsea Montoya   Date of Service: 05/27/2019   Reason For Visit:  annual        SUBJECTIVE:     Chelsea Montoya is a 63 y.o. [id number] Patient's last menstrual period was 06/26/1993 (approximate).female who presents for a gyn visit.      Concerns :  Crohn's  Remission   Asthma   No covid   Neck fusion 2019   Staph infection  Pneumonia 2019     Changes in bowel habits. None.   Number of BM / day see above   Colonoscopy 2017  Change in appetite :   none   Unexplained weight loss: none  Urinary incontinence:  none             REVIEW OF SYSTEMS:  A full 10 point review of systems was discussed with the patient with only pertinent positives listed above.        Past Medical History:   Diagnosis Date   . Anemia     resolved   . Anxiety    . Arrhythmia     followed by cardio    . Arthritis     osteoarthritis   . Asthma without status asthmaticus     allergy-induced only- LD 09/2017   . Blood transfusion without reported diagnosis     14 years ago   . Complication of anesthesia     wake up fast   . Crohn's colitis 01/31/2012   . Crohn's disease     remission - colonoscopy    . Duplicated right renal collecting system    . Heart murmur     followed by cardio    . Hypertension     Runs 120/78's   . Insomnia    . Kidney stone    . Mitral valve prolapse    . Pneumonia    . Pyelonephritis    . Seizures     Just once in 1978- stress induced    . Shortness of breath     R/T allergy   . Thyroid nodule     BILATERAL 09/2014; RT LOBE THYROID NODULE BX - BENIGN   . Vision abnormalities         Past Surgical History:   Procedure Laterality Date   . APPENDECTOMY  2000's   . BUNIONECTOMY Right 1998   . CLOSED REDUCTION, PERCUTANEOUS FIXATION, FINGER  10/28/2012    Procedure: CLOSED REDUCTION, PERCUTANEOUS FIXATION, FINGER;  Surgeon: Arta Bruce, MD;  Location: [facility];  Service: Orthopedics;  Laterality: Left;  Percutaneous Fixation of Left Ring Finger.     . COLECTOMY  1988    colon resection   . COLONOSCOPY W/  BIOPSIES  02/2016 2019    Dr Cinda Quest - 1 POLYP, INT HEMORRHOIDS, CROHN'S IN REMISSION - RTN 2021    . ELBOW SURGERY Right 2010   . ENDOMETRIAL BIOPSY  04/22/2014    RARE SUPERFICIAL ENDOMETRIAL GLANDS AMIDST MUCUS.TISSUE IS INSUFFICIENT FOR THE OPTIMAL EVALUATION OF THE  ENDOMETRIAL CAVITY.   Lavenia Atlas, ANTERIOR CERVICAL, LEVELS 3+ N/A 12/26/2017    Procedure: FUSION, ANTERIOR CERVICAL, LEVELS 3+;  Surgeon: Kizzie Ide, MD;  Location: [facility];  Service: Orthopedics;  Laterality: N/A;  C3-C7 ANTERIOR CERVICAL DISCECTOMY & FUSION   . GYNECOLOGIC CRYOSURGERY  1970's   . INCISION & DRAINAGE, WOUND N/A 01/01/2018    Procedure: INCISION & DRAINAGE, WOUND;  Surgeon: Kizzie Ide, MD;  Location: [facility];  Service: Orthopedics;  Laterality: N/A;   . KNEE ARTHROPLASTY Right 2001, 2016   . KNEE ARTHROPLASTY Left 2009   . THYROID NODULE BIOPSY  09/2014    RT LOBE THYROID NODULE       Family History   Problem Relation Age of Onset   . Ovarian cancer Paternal Aunt 60        died of complications of ovarian ca   . Lung cancer Mother         complications of lung ca   . Kidney failure Father    . Ovarian cancer Paternal Aunt         survivor   . Uterine cancer Maternal Grandmother 50        survivior   . Breast cancer Paternal Grandmother    . Colon cancer Neg Hx        OB History   Gravida Para Term Preterm AB Living   6 2 1     2    SAB TAB Ectopic Multiple Live Births           2      # Outcome Date GA Lbr Len/2nd Weight Sex Delivery Anes PTL Lv   6 Para 02/01/81    M Vag-Spont  N LIV   5 Term 05/13/78 [redacted]w[redacted]d   M Vag-Spont  Y LIV   4 Gravida            3 Gravida            2 Gravida            1 Gravida                  MEDICATIONS:  Current Outpatient Medications   Medication Sig   . albuterol (PROAIR HFA) 108 (90 Base) MCG/ACT inhaler USE 1-2 PUFFS EVERY 4 HOURS AS NEEDED   . ALPRAZolam (XANAX) 0.5 MG tablet Take 0.5 mg by mouth nightly as needed   . buPROPion XL (WELLBUTRIN XL) 300 MG 24 hr tablet Take 300  mg by mouth daily   . dicyclomine (BENTYL) 20 MG tablet TAKE 1/2 TABLET BY MOUTH THREE TIMES A DAY AS NEEDED (10mg )   . fluticasone-salmeterol (ADVAIR DISKUS) 250-50 MCG/DOSE Aerosol Powder, Breath Activtivatede as needed   . lidocaine (LIDODERM) 5 % Place 1 patch onto the skin every 24 hours Remove & Discard patch within 12 hours or as directed by MD   . Loratadine-Pseudoephedrine (CLARITIN-D 12 HOUR PO) Take by mouth as needed       . melatonin 3 mg tablet Take 6 mg by mouth nightly   . nystatin (NYSTOP) powder Apply topically 2 (two) times daily   . sertraline (ZOLOFT) 50 MG tablet Take 25 mg by mouth daily       ALLERGIES:  Allergies   Allergen Reactions   . Flexeril [Cyclobenzaprine] Nausea And Vomiting     Flu like symptoms   . Darvon Other (See Comments)     Gastric distress     . Latex    . Augmentin [Amoxicillin-Pot Clavulanate] Nausea And Vomiting   . Ciprofloxacin Rash   . Keflex [Cephalexin] Other (See Comments)     Gastric upset.     OBJECTIVE :    Vitals:    05/27/19 1107   BP: 120/80   Pulse: 100   Temp: 98.4 F (36.9 C)   SpO2: 98%   Weight: 94.3 kg (207 lb 12.8 oz)   Height: 1.575 m (  5\' 2" )       Gen : NAD  Thyroid : no masses, not enlarged  Heart: RRR  Lungs: CTAB  Breasts:   Right Breast:  No dominate masses, no nipple retractions or discharge, no skin dimpling, no axillary adenopathy    Left Breast:  No dominate masses, no nipple retractions or discharge, no skin dimpling, no axillary adenopathy   LN: No  supraclavicular, axillary, inguinal adenopathy   Abdm: Soft, non-tender, no masses, no rebound, no guarding, no CVAT no hepatic or splenic enlargement, no ascites.  Ext: no calf pain bilaterally, no CCE     Pelvic:   External genitalia: normal appearance for age. No ulcerations, erosions, fissures, or masses. The Skene's glands are NT to palpation. The Bartholin's glands are NT to palpation, and not enlarged. There are no exudates  Prolapse   Vagina: normal rugae,  without lesions, masses  or  erosions. Normal discharge.   Cervix is prominent and well epithelialized, no CMT  Uterus is appropriate sized  cm, mobile and without irregularities.   Adnexa  No masses or tenderness bilaterally.  Urethra no evidence of urethral prolapse   Ext: no calf pain bilaterally, no CCE     LABS:   No visits with results within 1 Month(s) from this visit.   Latest known visit with results is:   Admission on 04/15/2018, Discharged on 04/19/2018   Component Date Value   . WBC 04/15/2018 11.05*   . Hgb 04/15/2018 13.6    . Hematocrit 04/15/2018 41.6    . Platelets 04/15/2018 265    . RBC 04/15/2018 4.75    . MCV 04/15/2018 87.6    . MCH 04/15/2018 28.6    . MCHC 04/15/2018 32.7    . RDW 04/15/2018 14    . MPV 04/15/2018 9.1    . Neutrophils 04/15/2018 73.8    . Lymphocytes Automated 04/15/2018 16.5    . Monocytes 04/15/2018 7.6    . Eosinophils Automated 04/15/2018 1.4    . Basophils Automated 04/15/2018 0.5    . Immature Granulocytes 04/15/2018 0.2    . Nucleated RBC 04/15/2018 0.0    . Neutrophils Absolute 04/15/2018 8.15*   . Lymphocytes Absolute Aut* 04/15/2018 1.82    . Monocytes Absolute Autom* 04/15/2018 0.84    . Eosinophils Absolute Aut* 04/15/2018 0.16    . Basophils Absolute Autom* 04/15/2018 0.06    . Immature Granulocytes Ab* 04/15/2018 0.02    . Absolute NRBC 04/15/2018 0.00    . Glucose 04/15/2018 111*   . BUN 04/15/2018 5.0*   . Creatinine 04/15/2018 0.6    . Calcium 04/15/2018 9.2    . Sodium 04/15/2018 137    . Potassium 04/15/2018 4.4    . Chloride 04/15/2018 103    . CO2 04/15/2018 26    . Troponin I 04/15/2018 <0.01    . Ventricular Rate 04/16/2018 76    . Atrial Rate 04/16/2018 76    . P-R Interval 04/16/2018 180    . QRS Duration 04/16/2018 82    . Q-T Interval 04/16/2018 426    . QTC Calculation (Bezet) 04/16/2018 479    . P Axis 04/16/2018 36    . R Axis 04/16/2018 36    . T Axis 04/16/2018 -24    . Urine Type 04/15/2018 Clean Catch    . Color, UA 04/15/2018 Yellow    . Clarity, UA 04/15/2018 Hazy    .  Specific Gravity UA 04/15/2018 1.015    . Urine  pH 04/15/2018 6.0    . Leukocyte Esterase, UA 04/15/2018 Trace*   . Nitrite, UA 04/15/2018 Negative    . Protein, UR 04/15/2018 Negative    . Glucose, UA 04/15/2018 Negative    . Ketones UA 04/15/2018 Negative    . Urobilinogen, UA 04/15/2018 Normal    . Bilirubin, UA 04/15/2018 Negative    . Blood, UA 04/15/2018 Negative    . RBC, UA 04/15/2018 0 - 2    . WBC, UA 04/15/2018 0 - 5    . Squamous Epithelial Cell* 04/15/2018 6 - 10    . Hyaline Casts, UA 04/15/2018 3 - 5    . Yeast, UA 04/15/2018 Few*   . EGFR 04/15/2018 >60.0    . Sed Rate 04/15/2018 48*   . C-Reactive Protein 04/15/2018 5.5*   . D-Dimer 04/15/2018 0.82*   . Varicella Zoster Antibod* 04/16/2018 Negative    . Glucose 04/16/2018 103*   . BUN 04/16/2018 5.0*   . Creatinine 04/16/2018 0.6    . Calcium 04/16/2018 8.7    . Sodium 04/16/2018 137    . Potassium 04/16/2018 4.0    . Chloride 04/16/2018 101    . CO2 04/16/2018 25    . WBC 04/16/2018 11.16*   . Hgb 04/16/2018 12.3    . Hematocrit 04/16/2018 38.9    . Platelets 04/16/2018 246    . RBC 04/16/2018 4.42    . MCV 04/16/2018 88.0    . [facility] 04/16/2018 27.8    . MCHC 04/16/2018 31.6    . RDW 04/16/2018 15    . MPV 04/16/2018 9.3    . Neutrophils 04/16/2018 71.0    . Lymphocytes Automated 04/16/2018 19.2    . Monocytes 04/16/2018 7.4    . Eosinophils Automated 04/16/2018 1.6    . Basophils Automated 04/16/2018 0.4    . Immature Granulocytes 04/16/2018 0.4    . Nucleated RBC 04/16/2018 0.0    . Neutrophils Absolute 04/16/2018 7.92*   . Lymphocytes Absolute Aut* 04/16/2018 2.14    . Monocytes Absolute Autom* 04/16/2018 0.83    . Eosinophils Absolute Aut* 04/16/2018 0.18    . Basophils Absolute Autom* 04/16/2018 0.05    . Immature Granulocytes Ab* 04/16/2018 0.04    . Absolute NRBC 04/16/2018 0.00    . EGFR 04/16/2018 >60.0    . B-Natriuretic Peptide 04/17/2018 13    . Glucose 04/18/2018 86    . BUN 04/18/2018 6.0*   . Creatinine 04/18/2018 0.6    . Calcium  04/18/2018 8.7    . Sodium 04/18/2018 139    . Potassium 04/18/2018 4.1    . Chloride 04/18/2018 102    . CO2 04/18/2018 27    . WBC 04/18/2018 9.07    . Hgb 04/18/2018 11.8    . Hematocrit 04/18/2018 36.9    . Platelets 04/18/2018 260    . RBC 04/18/2018 4.12    . MCV 04/18/2018 89.6    . MCH 04/18/2018 28.6    . MCHC 04/18/2018 32.0    . RDW 04/18/2018 14    . MPV 04/18/2018 9.4    . Nucleated RBC 04/18/2018 0.0    . Absolute NRBC 04/18/2018 0.00    . EGFR 04/18/2018 >60.0        RADIOLOGY:   Mammogram   12/2018  birads 2     DEXA  T=0.4   Normal   12/2018  IMPRESSION :  MORGANN TELEP is a 63 y.o. [id number] who presents for a  gynecologic exam  Prolapse   Multiple co- morbidities         PLAN  Mammogram FRC       PROCEDURES  Pap hpv

## 2019-05-27 NOTE — Addendum Note (Signed)
Addended by: Daisy Floro on: 05/27/2019 01:13 PM     Modules accepted: Orders

## 2019-05-29 LAB — THINPREP IMAGING PAP & HPV MRNA E6/E7.
HPV mRNA E6/E7: NOT DETECTED
Pap Interpretation/Result: NEGATIVE

## 2019-05-29 LAB — URINALYSIS REFLEX TO MICROSCOPIC EXAM - REFLEX TO CULTURE
Bilirubin, UA: NEGATIVE
Blood, UA: NEGATIVE
Glucose Qualitative: NEGATIVE
Hyaline Casts, UA: NONE SEEN /LPF
Ketones UA: NEGATIVE
Nitrites: NEGATIVE
Protein, UA: NEGATIVE
Specific Gravity, UA: 1.015 (ref 1.001–1.035)
Urine Bacteria: NONE SEEN /HPF
pH: 5.5 (ref 5.0–8.0)

## 2019-05-29 LAB — URINE CULTURE

## 2019-05-29 LAB — REFLEXIVE URINE CULTURE

## 2019-06-27 DIAGNOSIS — H409 Unspecified glaucoma: Secondary | ICD-10-CM

## 2019-06-27 HISTORY — DX: Unspecified glaucoma: H40.9

## 2019-08-26 ENCOUNTER — Encounter (INDEPENDENT_AMBULATORY_CARE_PROVIDER_SITE_OTHER): Payer: BLUE CROSS/BLUE SHIELD

## 2019-11-30 ENCOUNTER — Other Ambulatory Visit: Payer: Self-pay

## 2019-11-30 ENCOUNTER — Emergency Department (HOSPITAL_COMMUNITY): Payer: 59

## 2019-11-30 ENCOUNTER — Encounter (HOSPITAL_COMMUNITY): Payer: Self-pay | Admitting: Emergency Medicine

## 2019-11-30 ENCOUNTER — Emergency Department (HOSPITAL_COMMUNITY)
Admission: EM | Admit: 2019-11-30 | Discharge: 2019-11-30 | Disposition: A | Discharge status: Home / Self Care | Payer: 59 | Attending: Emergency Medicine | Admitting: Emergency Medicine

## 2019-11-30 DIAGNOSIS — R509 Fever, unspecified: Secondary | ICD-10-CM | POA: Insufficient documentation

## 2019-11-30 DIAGNOSIS — M545 Low back pain, unspecified: Secondary | ICD-10-CM

## 2019-11-30 HISTORY — DX: Depression, unspecified: F32.A

## 2019-11-30 HISTORY — DX: Insomnia, unspecified: G47.00

## 2019-11-30 LAB — COMPREHENSIVE METABOLIC PANEL
ALT: 24 U/L (ref 0–44)
AST: 26 U/L (ref 15–41)
Albumin: 3 g/dL — ABNORMAL LOW (ref 3.5–5.0)
Alkaline Phosphatase: 60 U/L (ref 38–126)
Anion gap: 9 (ref 5–15)
BUN: 8 mg/dL (ref 8–23)
CO2: 25 mmol/L (ref 22–32)
Calcium: 8.7 mg/dL — ABNORMAL LOW (ref 8.9–10.3)
Chloride: 104 mmol/L (ref 98–111)
Creatinine, Ser: 0.68 mg/dL (ref 0.44–1.00)
GFR calc Af Amer: >60 mL/min (ref >60)
GFR calc non Af Amer: >60 mL/min (ref >60)
Glucose, Bld: 202 mg/dL — ABNORMAL HIGH (ref 70–99)
Potassium: 3.7 mmol/L (ref 3.5–5.1)
Sodium: 138 mmol/L (ref 135–145)
Total Bilirubin: 0.8 mg/dL (ref 0.3–1.2)
Total Protein: 6.8 g/dL (ref 6.5–8.1)

## 2019-11-30 LAB — URINALYSIS, ROUTINE W REFLEX MICROSCOPIC
Bilirubin Urine: NEGATIVE
Glucose, UA: NEGATIVE mg/dL
Hgb urine dipstick: NEGATIVE
Ketones, ur: NEGATIVE mg/dL
Leukocytes,Ua: NEGATIVE
Nitrite: NEGATIVE
Protein, ur: NEGATIVE mg/dL
Specific Gravity, Urine: 1.014 (ref 1.005–1.030)
pH: 7 (ref 5.0–8.0)

## 2019-11-30 LAB — CBC WITH DIFFERENTIAL/PLATELET
Abs Immature Granulocytes: 0.05 10*3/uL (ref 0.00–0.07)
Basophils Absolute: 0.1 10*3/uL (ref 0.0–0.1)
Basophils Relative: 1 %
Eosinophils Absolute: 0.1 10*3/uL (ref 0.0–0.5)
Eosinophils Relative: 1 %
HCT: 40.5 % (ref 36.0–46.0)
Hemoglobin: 13.1 g/dL (ref 12.0–15.0)
Immature Granulocytes: 1 %
Lymphocytes Relative: 14 %
Lymphs Abs: 1.5 10*3/uL (ref 0.7–4.0)
MCH: 29.4 pg (ref 26.0–34.0)
MCHC: 32.3 g/dL (ref 30.0–36.0)
MCV: 91 fL (ref 80.0–100.0)
Monocytes Absolute: 0.6 10*3/uL (ref 0.1–1.0)
Monocytes Relative: 5 %
Neutro Abs: 8.4 10*3/uL — ABNORMAL HIGH (ref 1.7–7.7)
Neutrophils Relative %: 78 %
Platelets: 260 10*3/uL (ref 150–400)
RBC: 4.45 MIL/uL (ref 3.87–5.11)
RDW: 13.7 % (ref 11.5–15.5)
WBC: 10.7 10*3/uL — ABNORMAL HIGH (ref 4.0–10.5)
nRBC: 0 % (ref 0.0–0.2)

## 2019-11-30 MED ORDER — TIZANIDINE HCL 4 MG PO TABS
2.0000 mg | ORAL_TABLET | Freq: Once | ORAL | Status: AC
  Administered 2019-11-30: 2 mg via ORAL
  Filled 2019-11-30: qty 1

## 2019-11-30 MED ORDER — PREDNISONE 20 MG PO TABS: 20.0000 mg | ORAL_TABLET | Freq: Two times a day (BID) | ORAL | 0 refills | Status: AC

## 2019-11-30 MED ORDER — HYDROCODONE-ACETAMINOPHEN 5-325 MG PO TABS
1.0000 | ORAL_TABLET | Freq: Once | ORAL | Status: AC
  Administered 2019-11-30: 1 via ORAL
  Filled 2019-11-30 (×2): qty 1

## 2019-11-30 MED ORDER — HYDROCODONE-ACETAMINOPHEN 5-325 MG PO TABS: 1.0000 | ORAL_TABLET | Freq: Four times a day (QID) | ORAL | 0 refills | Status: AC | PRN

## 2019-11-30 MED ORDER — CYCLOBENZAPRINE HCL 10 MG PO TABS
10.0000 mg | ORAL_TABLET | Freq: Two times a day (BID) | ORAL | 0 refills | Status: AC | PRN
Start: 2019-11-30 — End: ?

## 2019-11-30 MED ORDER — HYDROCODONE-ACETAMINOPHEN 5-325 MG PO TABS
1.0000 | ORAL_TABLET | Freq: Once | ORAL | Status: AC
  Administered 2019-11-30: 1 via ORAL
  Filled 2019-11-30: qty 1

## 2019-11-30 MED ORDER — KETOROLAC TROMETHAMINE 15 MG/ML IJ SOLN
15.0000 mg | Freq: Once | INTRAMUSCULAR | Status: AC
  Administered 2019-11-30: 15 mg via INTRAMUSCULAR
  Filled 2019-11-30: qty 1

## 2019-11-30 MED ORDER — CYCLOBENZAPRINE HCL 5 MG PO TABS: 10.0000 mg | ORAL_TABLET | Freq: Two times a day (BID) | ORAL | 0 refills | Status: AC | PRN

## 2019-11-30 MED ORDER — ONDANSETRON HCL 4 MG PO TABS
4.0000 mg | ORAL_TABLET | Freq: Once | ORAL | Status: AC
  Administered 2019-11-30: 4 mg via ORAL
  Filled 2019-11-30: qty 1

## 2019-11-30 MED ORDER — HYDROCODONE-ACETAMINOPHEN 5-325 MG PO TABS: 2.0000 | ORAL_TABLET | ORAL | 0 refills | Status: AC | PRN

## 2019-11-30 MED ORDER — DICLOFENAC EPOLAMINE 1.3 % EX PT24: 1.0000 | MEDICATED_PATCH | Freq: Two times a day (BID) | CUTANEOUS | 0 refills | Status: AC

## 2019-11-30 NOTE — Discharge Instructions (Addendum)
You have been seen today for back pain. Please read and follow all provided instructions. Return to the emergency room for worsening condition or new concerning symptoms.    Xrays did not show any broken bones today.  1. Medications:  -Prescription sent to your pharmacy for diclofenac patches.  If your insurance does not cover this you can buy the over-the-counter version Salonpas.  Make sure it has methyl salicylate as we discussed. -Prescription also sent for prednisone burst.  Take this as prescribed -Prescription also sent for Flexeril.  This a muscle relaxer.  This medicine can make you drowsy so do not drive or work when taking it.  Most people  will take it before bed to help him sleep if the pain is severe. -Prescription for Norco for severe pain only.  Continue usual home medications Take medications as prescribed. Please review all of the medicines and only take them if you do not have an allergy to them.   2. Treatment: rest, drink plenty of fluids  3. Follow Up:  Please follow up with primary care provider by scheduling an appointment as soon as possible for a    It is also a possibility that you have an allergic reaction to any of the medicines that you have been prescribed - Everybody reacts differently to medications and while MOST people have no trouble with most medicines, you may have a reaction such as nausea, vomiting, rash, swelling, shortness of breath. If this is the case, please stop taking the medicine immediately and contact your physician.  ?

## 2019-11-30 NOTE — ED Triage Notes (Signed)
C/o sacral pain that radiates down R leg that started earlier in the week.  No known injury.

## 2019-11-30 NOTE — ED Notes (Signed)
Gave update to patient and explained that MD and PA would like to do further testing. Pt verbalizes understanding.

## 2019-11-30 NOTE — ED Provider Notes (Signed)
[facility] EMERGENCY DEPARTMENT Provider Note   CSN: [id number] Arrival date & time: 11/30/19  1448     History Chief Complaint  Patient presents with   Back Pain    Alyssa Franklin is a 64 y.o. female presents to emergency room today with chief complaint of progressively worsening back pain x1 week.  Patient states pain is located in her elbow and in right lower back with radiation down posterior right leg.  She states the pain is constant and waxes and wanes.  Pain is worse with ambulation.  She states she feels like she has spasms frequently.  She has tried taking Tylenol and using lidocaine patches without any symptom relief.  She rates the pain 10 out of 10 in severity.  She states she has difficulty ambulating and bearing weight on right leg because of the pain.  Surgical history includes fusion of multiple disks in cervical spine. Denies fevers, weight loss, numbness/weakness of upper and lower extremities, bowel/bladder incontinence, urinary retention, history of cancer, saddle anesthesia, history of back surgery, history of IVDA.   Past Medical History:  Diagnosis Date   Depression    Insomnia     There are no problems to display for this patient.   History reviewed. No pertinent surgical history.   OB History   No obstetric history on file.     No family history on file.  Social History   Tobacco Use   Smoking status: Never Smoker   Smokeless tobacco: Never Used  Substance Use Topics   Alcohol use: Not Currently   Drug use: Not Currently    Home Medications Prior to Admission medications   Medication Sig Start Date End Date Taking? Authorizing Provider  cyclobenzaprine (FLEXERIL) 5 MG tablet Take 2 tablets (10 mg total) by mouth 2 (two) times daily as needed for up to 7 days for muscle spasms. 11/30/19 12/07/19  Ashton Belote, Caroleen Hamman, PA-C  Diclofenac Epolamine 1.3 % PT24 Apply 1 patch topically 2 (two) times daily. 11/30/19   Kaevion Sinclair,  Tallie Dodds E, PA-C  predniSONE (DELTASONE) 20 MG tablet Take 1 tablet (20 mg total) by mouth 2 (two) times daily with a meal for 5 days. 11/30/19 12/05/19  Sonnie Pawloski, Caroleen Hamman, PA-C    Allergies    Patient has no allergy information on record.  Review of Systems   Review of Systems  All other systems are reviewed and are negative for acute change except as noted in the HPI.   Physical Exam Updated Vital Signs BP 122/76 (BP Location: Right Arm)    Pulse 89    Temp 98.8 F (37.1 C) (Oral)    Resp 16    SpO2 95%   Physical Exam Vitals and nursing note reviewed.  Constitutional:      General: She is not in acute distress.    Appearance: She is not ill-appearing.  HENT:     Head: Normocephalic and atraumatic.     Right Ear: Tympanic membrane and external ear normal.     Left Ear: Tympanic membrane and external ear normal.     Nose: Nose normal.     Mouth/Throat:     Mouth: Mucous membranes are moist.     Pharynx: Oropharynx is clear.  Eyes:     General: No scleral icterus.       Right eye: No discharge.        Left eye: No discharge.     Extraocular Movements: Extraocular movements intact.  Conjunctiva/sclera: Conjunctivae normal.     Pupils: Pupils are equal, round, and reactive to light.  Neck:     Vascular: No JVD.  Cardiovascular:     Rate and Rhythm: Normal rate and regular rhythm.     Pulses: Normal pulses.          Radial pulses are 2+ on the right side and 2+ on the left side.     Heart sounds: Normal heart sounds.  Pulmonary:     Comments: Lungs clear to auscultation in all fields. Symmetric chest rise. No wheezing, rales, or rhonchi. Abdominal:     Comments: Abdomen is soft, non-distended, and non-tender in all quadrants. No rigidity, no guarding. No peritoneal signs.  Musculoskeletal:        General: Normal range of motion.     Cervical back: Normal range of motion.       Back:     Comments: Tenderness to palpation as the patient image above.  No overlying skin  changes.  No spasm.  No midline tenderness.  Skin:    General: Skin is warm and dry.     Capillary Refill: Capillary refill takes less than 2 seconds.  Neurological:     Mental Status: She is oriented to person, place, and time.     GCS: GCS eye subscore is 4. GCS verbal subscore is 5. GCS motor subscore is 6.     Comments: Fluent speech, no facial droop.  Sensation grossly intact to light touch in the lower extremities bilaterally. No saddle anesthesias. Strength 5/5 with flexion and extension at the bilateral hips, knees, and ankles. Antalgic gait secondary to pain Coordination intact with heel to shin testing.   Psychiatric:        Behavior: Behavior normal.     ED Results / Procedures / Treatments   Labs (all labs ordered are listed, but only abnormal results are displayed) Labs Reviewed  CBC WITH DIFFERENTIAL/PLATELET - Abnormal; Notable for the following components:      Result Value   WBC 10.7 (*)    Neutro Abs 8.4 (*)    All other components within normal limits  COMPREHENSIVE METABOLIC PANEL  URINALYSIS, ROUTINE W REFLEX MICROSCOPIC    EKG None  Radiology DG Lumbar Spine Complete  Result Date: 11/30/2019 CLINICAL DATA:  Sacral pain radiates down the RIGHT leg. Pain started earlier in the week. No known injury. EXAM: LUMBAR SPINE - COMPLETE 4+ VIEW COMPARISON:  Sacrum same day FINDINGS: There are degenerative changes in the lumbar spine facet hypertrophy identified in the LOWER lumbar levels. Significant disc height loss and uncovertebral spurring at T12-L1, L1-L2. There is 7 millimeters anterolisthesis of L4 on L5, likely degenerative. There is moderate degenerative change in the LOWER thoracic spine. No acute fracture or traumatic subluxation. Surgical clips are present in the central abdomen. Bowel gas pattern is nonobstructive. IMPRESSION: 1. Degenerative changes.  Grade 1 anterolisthesis of L4 on L5. 2. No evidence for acute abnormality. Electronically Signed   By:  Nolon Nations M.D.   On: 11/30/2019 13:21   DG Sacrum/Coccyx  Result Date: 11/30/2019 CLINICAL DATA:  Sacral pain radiates down the RIGHT leg. Pain started early in the week. No known injury. EXAM: SACRUM AND COCCYX - 2+ VIEW COMPARISON:  None. FINDINGS: No acute fracture or subluxation of the sacrum/coccyx. Degenerative changes are noted in the LOWER lumbar spine. IMPRESSION: Negative. Electronically Signed   By: Nolon Nations M.D.   On: 11/30/2019 13:19    Procedures Procedures (including  critical care time)  Medications Ordered in ED Medications  tiZANidine (ZANAFLEX) tablet 2 mg (2 mg Oral Given 11/30/19 1331)  ketorolac (TORADOL) 15 MG/ML injection 15 mg (15 mg Intramuscular Given 11/30/19 1437)  HYDROcodone-acetaminophen (NORCO/VICODIN) 5-325 MG per tablet 1 tablet (1 tablet Oral Given 11/30/19 1437)  ondansetron (ZOFRAN) tablet 4 mg (4 mg Oral Given 11/30/19 1437)    ED Course  I have reviewed the triage vital signs and the nursing notes.  Pertinent labs & imaging results that were available during my care of the patient were reviewed by me and considered in my medical decision making (see chart for details).    MDM Rules/Calculators/A&P                      History provided by patient and spouse with additional history obtained from chart review.    Patient with back pain.  No neurological deficits and normal neuro exam.  Patient can walk but states is painful.  No loss of bowel or bladder control.  No concern for cauda equina.  No fever, night sweats, weight loss, h/o cancer, IVDU.  On exam she does appear to be having spasms in right lower back.  X-ray of lumbar and sacral spine without any acute findings.  Case discussed with ED attending who also evaluated patient agrees with plan for symptom control including dose of IM Toradol, Zanaflex and Norco.  Patient denies any history of chronic kidney disease.   On reassessment patient reports pain has improved and is eager to be  discharged home.  Will send short burst of steroids as this is likely radicular pain and low-dose muscle relaxer and diclofenac patches. I have reviewed the PDMP during this encounter.  She has no active narcotic prescriptions.  Short course of Norco given for severe pain only.  Vital signs were checked prior to discharge and she was found to be febrile to 100.4. after receiving 2 antipyretics while in the emergency department.  Will add on basic labs and check UA for infection.  Patient signed out to ED attending to follow up on results.   Portions of this note were generated with Scientist, clinical (histocompatibility and immunogenetics). Dictation errors may occur despite best attempts at proofreading.   Final Clinical Impression(s) / ED Diagnoses Final diagnoses:  Acute right-sided low back pain, unspecified whether sciatica present    Rx / DC Orders ED Discharge Orders         Ordered    Diclofenac Epolamine 1.3 % PT24  2 times daily     11/30/19 1508    cyclobenzaprine (FLEXERIL) 5 MG tablet  2 times daily PRN     11/30/19 1508    predniSONE (DELTASONE) 20 MG tablet  2 times daily with meals     11/30/19 1508    HYDROcodone-acetaminophen (NORCO/VICODIN) 5-325 MG tablet  Every 6 hours PRN     11/30/19 1653           Shawnette Augello, [place], PA-C 11/30/19 1654    Gerhard Munch, MD 11/30/19 1722

## 2019-11-30 NOTE — ED Notes (Signed)
After reviewing discharge vital signs, discussed with Dr. Jeraldine Loots regarding temperature 100.4 orally.  Dr. Jeraldine Loots and Britt Bolognese PA will discuss further testing.

## 2020-05-27 ENCOUNTER — Ambulatory Visit: Payer: BLUE CROSS/BLUE SHIELD | Admitting: Obstetrics & Gynecology

## 2020-06-14 ENCOUNTER — Encounter (INDEPENDENT_AMBULATORY_CARE_PROVIDER_SITE_OTHER): Payer: Self-pay | Admitting: Cardiovascular Disease

## 2020-06-21 NOTE — Progress Notes (Signed)
[facility] OFF[facility] PROGRESS NOTE    [facility] OFFICE -CARDIOLOGY  [address]  [place] [address]  Dept: [phone]  Dept Fax: [phone]       Patient Name: Chelsea Montoya    Date of Visit:  June 22, 2020  Date of Birth: [birth year]  AGE: 64 y.o.  Medical Record #: [id number]  Requesting Physician: Dante Gang, MD      CHIEF COMPLAINT:   Chief Complaint   Patient presents with   . Follow-up        HISTORY OF PRESENT ILLNESS:    She is a 64 y.o. female who presents today for a followup visit.  Followed in our [place] office.  Seen recently by optho who noted hypertropia OS--->Echo and carotid recommended.  In addition she describes an episode of chest pain a number of weeks ago while in bed. Substernal. Resolved in less than a minute.  Noted to be very fatigued. Limited physical activity.    PAST MEDICAL HISTORY: She has a past medical history of Anemia, Anxiety, Arrhythmia, Arthritis, Asthma without status asthmaticus, Blood transfusion without reported diagnosis, Complication of anesthesia, Crohn's colitis (01/31/2012), Crohn's disease, Duplicated right renal collecting system, Heart murmur, Hypertension, Insomnia, Kidney stone, Mild stage glaucoma (2021), Mitral valve prolapse, Pneumonia, Pyelonephritis, Seizures, Shortness of breath, Thyroid nodule, and Vision abnormalities. She has a past surgical history that includes APPENDECTOMY (OPEN) (2000's); Gynecologic cryosurgery (1970's); Colectomy (1988); Bunionectomy (Right, 1998); CLOSED REDUCTION, PERCUTANEOUS FIXATION, FINGER (10/28/2012); Colonoscopy w/ biopsies (02/2016 2019); Endometrial biopsy (04/22/2014); THYROID NODULE BIOPSY (09/2014); Knee Arthroplasty (Right, 2001, 2016); Knee Arthroplasty (Left, 2009); Elbow surgery (Right, 2010); FUSION, ANTERIOR CERVICAL, LEVELS 3+ (N/A, 12/26/2017); and INCISION & DRAINAGE, WOUND (N/A, 01/01/2018).    ALLERGIES:   Allergies   Allergen  Reactions   . Flexeril [Cyclobenzaprine] Nausea And Vomiting     Flu like symptoms   . Darvon Other (See Comments)     Gastric distress     . Latex    . Augmentin [Amoxicillin-Pot Clavulanate] Nausea And Vomiting   . Ciprofloxacin Rash   . Keflex [Cephalexin] Other (See Comments)     Gastric upset.       MEDICATIONS:   Current Outpatient Medications   Medication Sig   . albuterol (PROAIR HFA) 108 (90 Base) MCG/ACT inhaler USE 1-2 PUFFS EVERY 4 HOURS AS NEEDED   . ALPRAZolam (XANAX) 0.5 MG tablet Take 0.5 mg by mouth nightly as needed   . buPROPion XL (WELLBUTRIN XL) 300 MG 24 hr tablet Take 300 mg by mouth daily   . dicyclomine (BENTYL) 20 MG tablet TAKE 1/2 TABLET BY MOUTH THREE TIMES A DAY AS NEEDED (10mg )   . fluticasone-salmeterol (ADVAIR DISKUS) 250-50 MCG/DOSE Aerosol Powder, Breath Activtivatede as needed   . lidocaine (LIDODERM) 5 % Place 1 patch onto the skin every 24 hours Remove & Discard patch within 12 hours or as directed by MD   . Loratadine-Pseudoephedrine (CLARITIN-D 12 HOUR PO) Take by mouth as needed       . Melatonin 3 MG Cap Take by mouth nightly 2 po q pm     . pregabalin (LYRICA) 100 MG capsule Take 100 mg by mouth daily   . sertraline (ZOLOFT) 100 MG tablet Take 100 mg by mouth daily             SOCIAL HISTORY: She reports that she has never smoked. She has never used smokeless tobacco. She reports current alcohol  use. She reports that she does not use drugs.    PHYSICAL EXAMINATION    Visit Vitals  BP 118/80 (BP Site: Left arm, Patient Position: Sitting, Cuff Size: Medium)   Pulse 70   Ht 1.524 m (5')   Wt 89.8 kg (198 lb)   LMP 06/26/1993 (Approximate)   BMI 38.67 kg/m     Last 3 Weights for the past 72 hrs (Last 3 readings):   Weight   06/22/20 0820 89.8 kg (198 lb)        GENERAL: Patient is in no acute distress   HEENT: No scleral icterus or conjunctival pallor, moist mucous membranes   NECK: No jugular venous distention, normal carotid upstrokes without bruits   CARDIAC: Regular rate  and rhythm, with normal S1 and S2, and no murmurs, rubs, or gallops   CHEST: Clear to auscultation bilaterally, normal respiratory effort  ABDOMEN: Nontender, non-distended  EXTREMITIES: No edema  PULSES: Full and equal bilaterally.  SKIN: No rash or jaundice   NEUROLOGIC: Alert and oriented to time, place and person, normal mood and affect   MUSCULOSKELETAL: Normal muscle strength and tone.    ECG: NSR 70    Labs:     2021 reviewed  Assessment:   She has been followed in our [place] office. Dr Melrose Nakayama, Dr Lance Bosch    1. Seen by opthalmology - hypertropia. Carotid doppler and echo recommended.  2. Episode of chest pain as well as severe fatigue    Echo 2019:  EF 65%. Mild AI. Trace TR.  Mild AI noted on echo 2019, 2017, 2011    Normal stress nuclear 2017.10 METs      Last seen by Dr Lance Bosch in 2019:  1. Preoperative risk stratification for cervical spine surgery.  2. Chronic diastolic heart failure.  3. Prior history of PVCs with none recently.  4. Obesity.  5. Anxiety/depression.  6. Normal nuclear stress test 2017.  7. Normal systolic function by echocardiogram performed in our office last week.  She    also had normal pulmonary artery pressures.  Plan:     1. Carotid doppler  2. Echocardiogram  3. Stress Nuclear study. Lexiscan if not able to achieve target heart rate (knee issues though would like to try treadmill stress)    Signed by: Sandria Senter, MD                                                           Orders Placed This Encounter   Procedures   . ECG 12 lead (Normal)       No orders of the defined types were placed in this encounter.      SIGNED:    Sandria Senter, MD          This note was generated by the Dragon speech recognition and may contain errors or omissions not intended by the user. Grammatical errors, random word insertions, deletions, pronoun errors, and incomplete sentences are occasional consequences of this technology due to software limitations. Not all errors are caught or corrected. If  there are questions or concerns about the content of this note or information contained within the body of this dictation, they should be addressed directly with the author for clarification.

## 2020-06-22 ENCOUNTER — Encounter (INDEPENDENT_AMBULATORY_CARE_PROVIDER_SITE_OTHER): Payer: Self-pay | Admitting: Cardiovascular Disease

## 2020-06-22 ENCOUNTER — Ambulatory Visit (INDEPENDENT_AMBULATORY_CARE_PROVIDER_SITE_OTHER): Payer: Commercial Managed Care - PPO | Admitting: Cardiovascular Disease

## 2020-06-22 VITALS — BP 118/80 | HR 70 | Ht 60.0 in | Wt 198.0 lb

## 2020-06-22 DIAGNOSIS — I493 Ventricular premature depolarization: Secondary | ICD-10-CM

## 2020-06-22 DIAGNOSIS — R079 Chest pain, unspecified: Secondary | ICD-10-CM

## 2020-06-22 DIAGNOSIS — H5022 Vertical strabismus, left eye: Secondary | ICD-10-CM

## 2020-06-22 DIAGNOSIS — I351 Nonrheumatic aortic (valve) insufficiency: Secondary | ICD-10-CM

## 2020-06-22 DIAGNOSIS — R0602 Shortness of breath: Secondary | ICD-10-CM

## 2020-07-05 ENCOUNTER — Telehealth (INDEPENDENT_AMBULATORY_CARE_PROVIDER_SITE_OTHER): Payer: Self-pay

## 2020-07-05 ENCOUNTER — Inpatient Hospital Stay
Admission: RE | Admit: 2020-07-05 | Discharge: 2020-07-05 | Disposition: A | Discharge status: Home / Self Care | Payer: BLUE CROSS/BLUE SHIELD | Source: Ambulatory Visit | Attending: Cardiovascular Disease | Admitting: Cardiovascular Disease

## 2020-07-05 DIAGNOSIS — R079 Chest pain, unspecified: Secondary | ICD-10-CM | POA: Insufficient documentation

## 2020-07-05 DIAGNOSIS — R0602 Shortness of breath: Secondary | ICD-10-CM | POA: Insufficient documentation

## 2020-07-05 MED ORDER — TECHNETIUM TC 99M TETROFOSMIN IV KIT
10.00 | PACK | Freq: Once | INTRAVENOUS | Status: AC | PRN
Start: 2020-07-05 — End: 2020-07-05
  Administered 2020-07-05: 11:00:00 10 via INTRAVENOUS
  Filled 2020-07-05: qty 100

## 2020-07-05 MED ORDER — TECHNETIUM TC 99M TETROFOSMIN IV KIT
35.00 | PACK | Freq: Once | INTRAVENOUS | Status: AC | PRN
Start: 2020-07-05 — End: 2020-07-05
  Administered 2020-07-05: 13:00:00 35 via INTRAVENOUS
  Filled 2020-07-05: qty 100

## 2020-07-05 NOTE — Telephone Encounter (Signed)
LVM for pt to cb regarding stress test result per DRP:    Please let the patient know that their stress test was normal.

## 2020-07-05 NOTE — Telephone Encounter (Signed)
Pt called back after receiving VM from [facility].  Pt given today's Exercise Nuclear Stress Test Result Impressions and Recommendations. Pt v/u.

## 2020-07-08 ENCOUNTER — Ambulatory Visit
Admission: RE | Admit: 2020-07-08 | Discharge: 2020-07-08 | Disposition: A | Discharge status: Home / Self Care | Payer: BLUE CROSS/BLUE SHIELD | Source: Ambulatory Visit | Attending: Cardiovascular Disease | Admitting: Cardiovascular Disease

## 2020-07-08 DIAGNOSIS — H5022 Vertical strabismus, left eye: Secondary | ICD-10-CM | POA: Insufficient documentation

## 2020-07-08 DIAGNOSIS — I6521 Occlusion and stenosis of right carotid artery: Secondary | ICD-10-CM | POA: Insufficient documentation

## 2020-07-14 ENCOUNTER — Telehealth (INDEPENDENT_AMBULATORY_CARE_PROVIDER_SITE_OTHER): Payer: Self-pay

## 2020-07-14 NOTE — Telephone Encounter (Addendum)
Left voicemail to call back for carotid duplex results.       ----- Message from Lelan Pons, MD sent at 07/10/2020  3:43 PM EST -----  Call patient re: carotid duplex shows right sided <50% ICA disease, recommend aggressive risk factor modification (statin therapy, aspirin, diet, exercise, blood pressure control).  This degree of narrowing would be considered mild.  She really just has a small atherosclerotic plaque noted in the right carotid bulb, there is no plaque seen anywhere else in the system.  Please let her know that this would not cause symptoms, she should just follow-up with Dr.Rubin as she normally would.  Most patients with this condition are taking a cholesterol medication such as a statin but she should discuss that with her physicians.  No need to order a repeat study in this patient as she is followed by Dr. Donnie Coffin, who is asked that we not order these follow-ups.

## 2020-07-20 ENCOUNTER — Ambulatory Visit
Admission: RE | Admit: 2020-07-20 | Discharge: 2020-07-20 | Disposition: A | Discharge status: Home / Self Care | Payer: BLUE CROSS/BLUE SHIELD | Source: Ambulatory Visit | Attending: Cardiovascular Disease | Admitting: Cardiovascular Disease

## 2020-07-20 DIAGNOSIS — I493 Ventricular premature depolarization: Secondary | ICD-10-CM | POA: Insufficient documentation

## 2020-07-20 DIAGNOSIS — R0602 Shortness of breath: Secondary | ICD-10-CM | POA: Insufficient documentation

## 2020-07-20 DIAGNOSIS — R079 Chest pain, unspecified: Secondary | ICD-10-CM | POA: Insufficient documentation

## 2020-07-20 DIAGNOSIS — H5022 Vertical strabismus, left eye: Secondary | ICD-10-CM | POA: Insufficient documentation

## 2020-07-20 DIAGNOSIS — I351 Nonrheumatic aortic (valve) insufficiency: Secondary | ICD-10-CM

## 2020-07-20 DIAGNOSIS — E669 Obesity, unspecified: Secondary | ICD-10-CM | POA: Insufficient documentation

## 2020-07-20 DIAGNOSIS — Z01810 Encounter for preprocedural cardiovascular examination: Secondary | ICD-10-CM | POA: Insufficient documentation

## 2020-07-20 DIAGNOSIS — I358 Other nonrheumatic aortic valve disorders: Secondary | ICD-10-CM | POA: Insufficient documentation

## 2020-07-21 ENCOUNTER — Telehealth (INDEPENDENT_AMBULATORY_CARE_PROVIDER_SITE_OTHER): Payer: Self-pay

## 2020-07-21 NOTE — Telephone Encounter (Signed)
-----   Message from Otis Dials, MD sent at 07/21/2020  3:14 PM EST -----  Regarding: Echocardiogram results  Please let the patient know that their echocardiogram is unchanged from prior.    Thank you, Dr. Allena Katz

## 2020-07-21 NOTE — Telephone Encounter (Signed)
Called patient to relay echo results per Dr. Allena Katz. Told patient that her echo is unchanged from prior. Pt also wanted results on recent carotid duplex study. Relayed to her Dr. Levy Sjogren results and recommendations:     carotid duplex shows right sided <50% ICA disease, recommend aggressive risk factor modification (statin therapy, aspirin, diet, exercise, blood pressure control).  This degree of narrowing would be considered mild.  She really just has a small atherosclerotic plaque noted in the right carotid bulb, there is no plaque seen anywhere else in the system.  Please let her know that this would not cause symptoms, she should just follow-up with Dr.Rubin as she normally would.  Most patients with this condition are taking a cholesterol medication such as a statin but she should discuss that with her physicians.  No need to order a repeat study in this patient as she is followed by Dr. Donnie Coffin, who is asked that we not order these follow-ups.    Pt v/u and asked that I forward her tests to her PCP, transferred Pt to RO front desk for her to see if she can move up her appt. with Dr. Melrose Nakayama.
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
## 2020-11-21 IMAGING — DX DG LUMBAR SPINE COMPLETE 4+V
5 series · 5 of 5 positions shown · non-contrast
Comparison: Sacrum same day

CLINICAL DATA: Sacral pain radiates down the RIGHT leg. Pain
started earlier in the week. No known injury.

EXAM:
LUMBAR SPINE - COMPLETE 4+ VIEW

[l-spine ap]
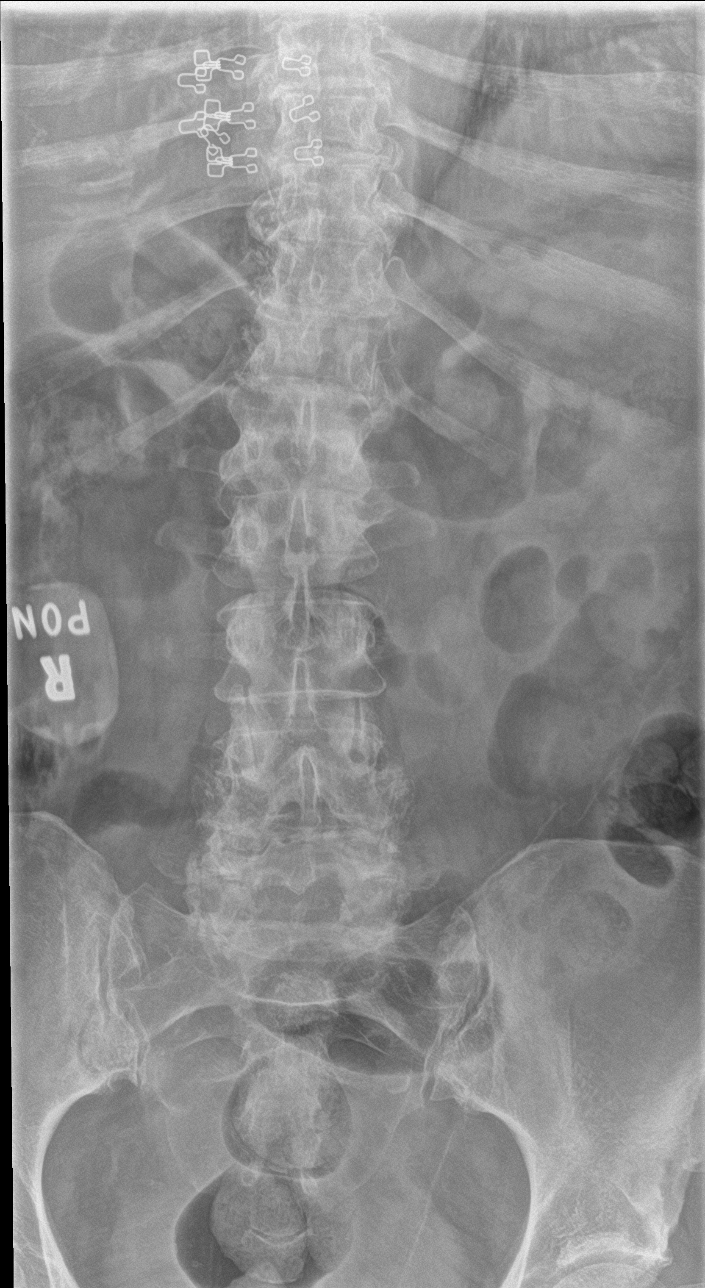

[l-spine obl (1 of 2)]
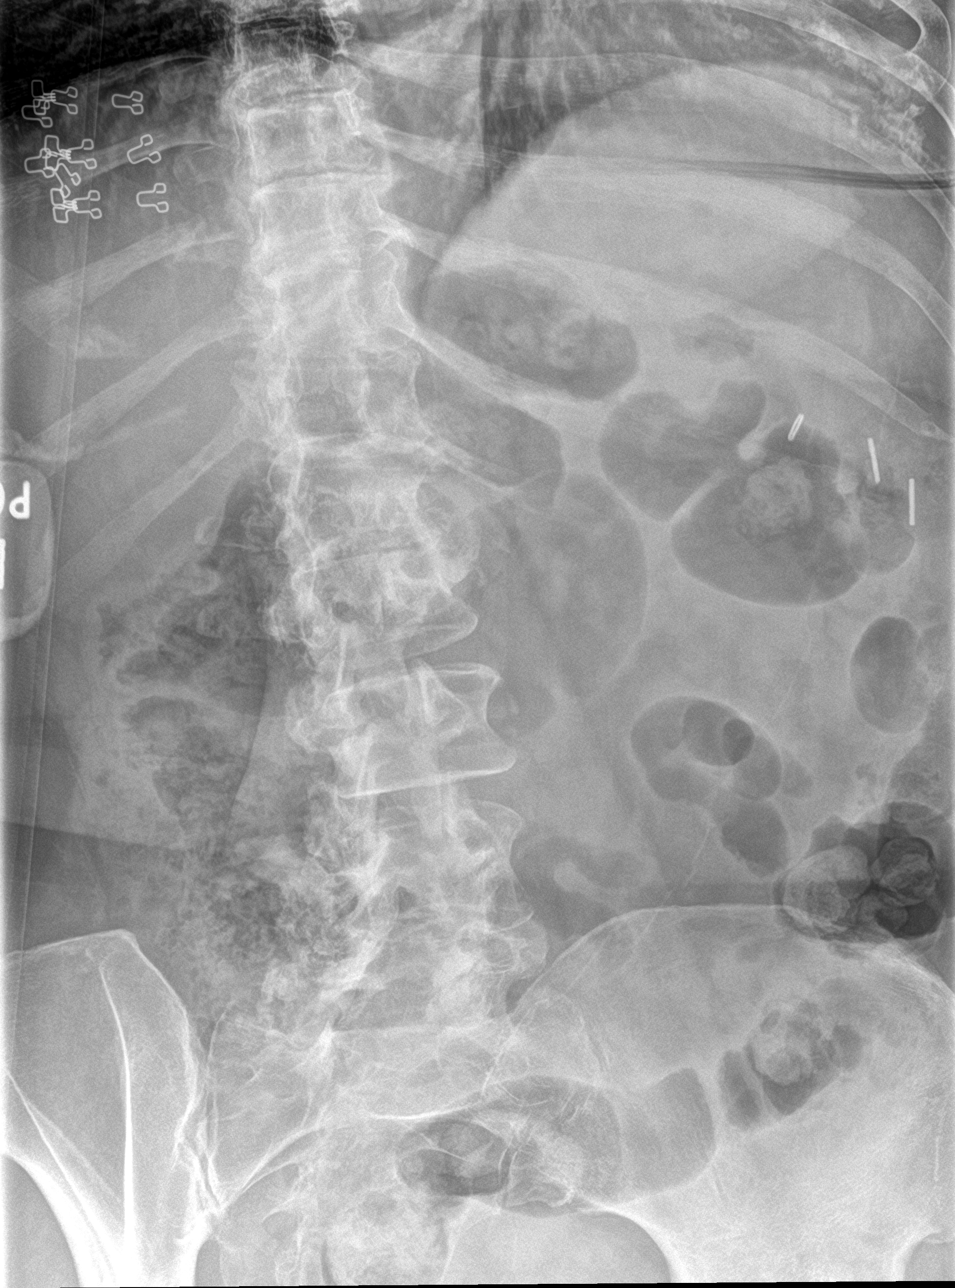

[l-spine lat]
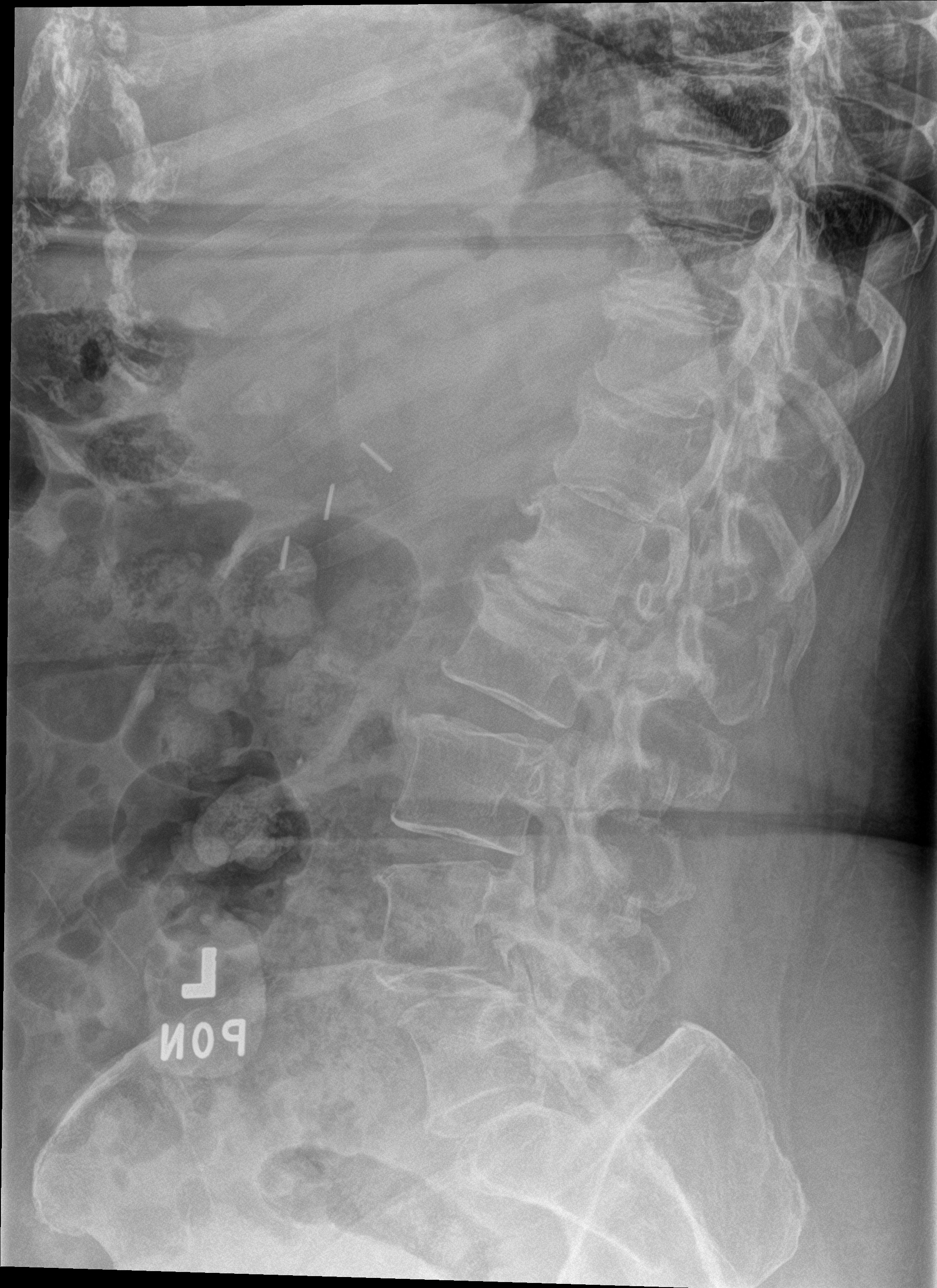

[l-spine spot]
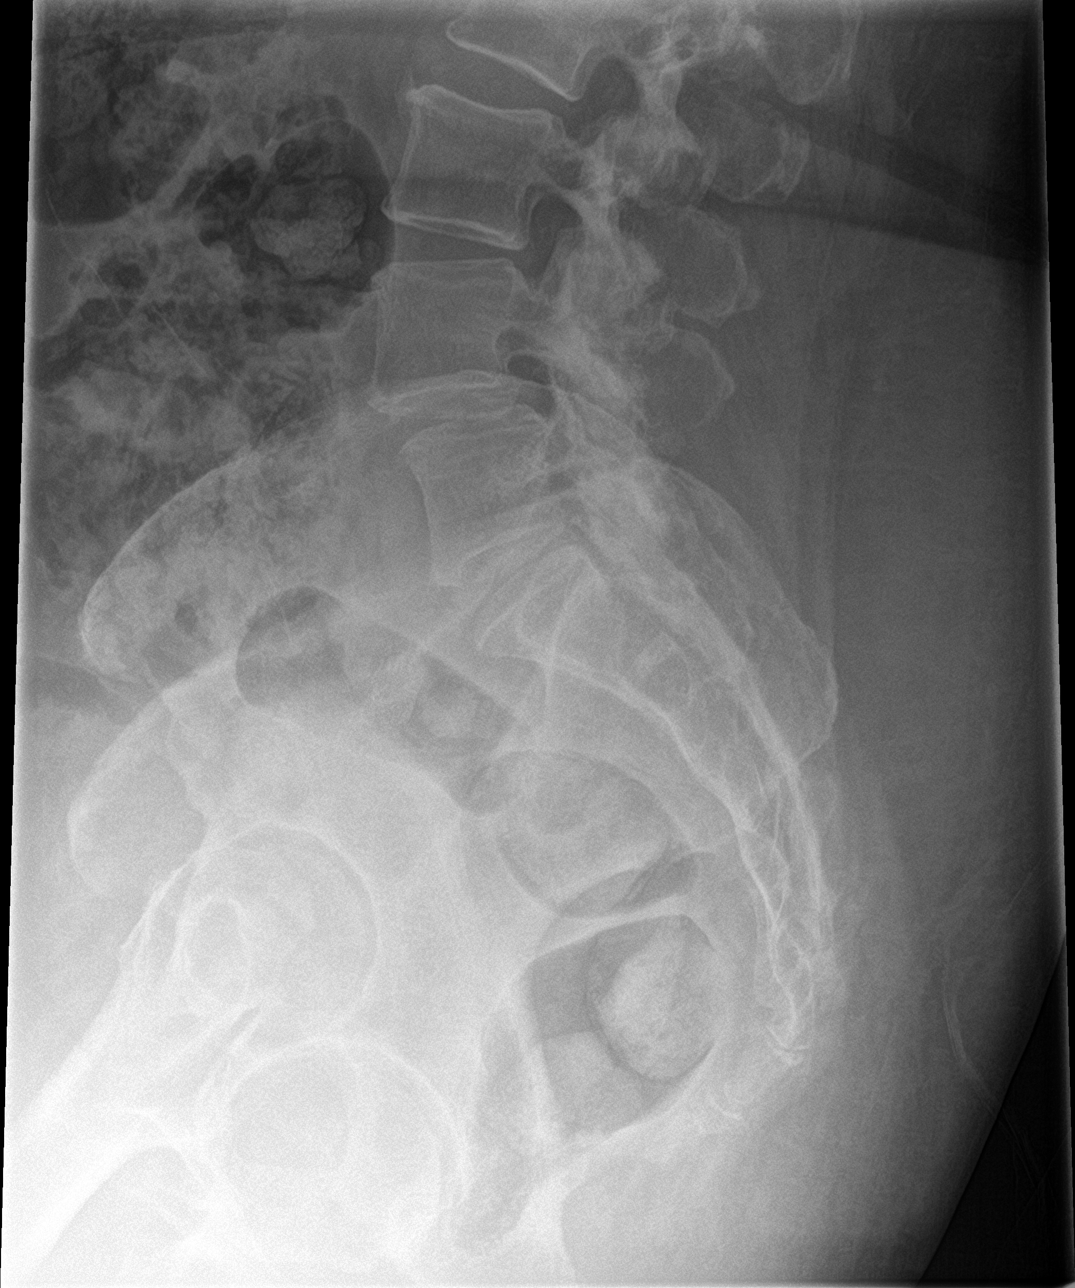

[l-spine obl (2 of 2)]
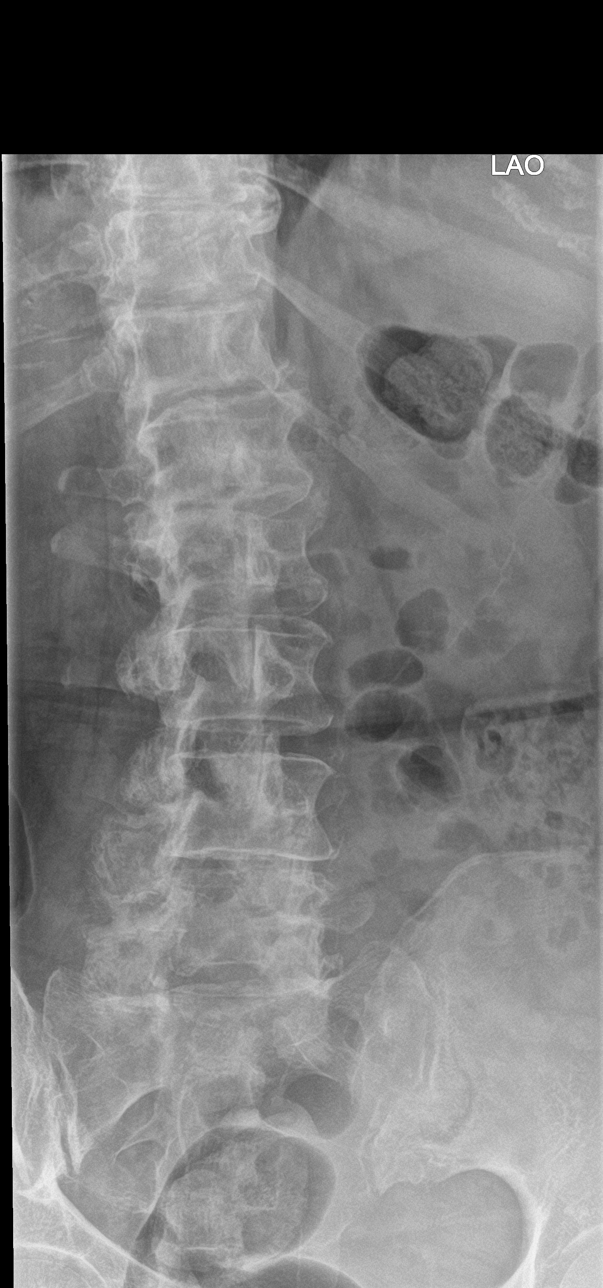

[5 of 5 positions shown; findings below may reference images not displayed]

FINDINGS: There are degenerative changes in the lumbar spine facet hypertrophy
identified in the LOWER lumbar levels. Significant disc height loss
and uncovertebral spurring at T12-L1, L1-L2. There is 7 millimeters
anterolisthesis of L4 on L5, likely degenerative. There is moderate
degenerative change in the LOWER thoracic spine.

No acute fracture or traumatic subluxation. Surgical clips are
present in the central abdomen. Bowel gas pattern is nonobstructive.
IMPRESSION: 1. Degenerative changes.  Grade 1 anterolisthesis of L4 on L5.
2. No evidence for acute abnormality.

## 2020-12-10 ENCOUNTER — Ambulatory Visit (INDEPENDENT_AMBULATORY_CARE_PROVIDER_SITE_OTHER): Payer: Commercial Managed Care - PPO | Admitting: Cardiovascular Disease

## 2020-12-10 ENCOUNTER — Encounter (INDEPENDENT_AMBULATORY_CARE_PROVIDER_SITE_OTHER): Payer: Self-pay | Admitting: Cardiovascular Disease

## 2020-12-10 VITALS — BP 126/86 | HR 84 | Ht 60.0 in | Wt 195.0 lb

## 2020-12-10 DIAGNOSIS — I493 Ventricular premature depolarization: Secondary | ICD-10-CM

## 2020-12-10 DIAGNOSIS — I6521 Occlusion and stenosis of right carotid artery: Secondary | ICD-10-CM

## 2020-12-10 DIAGNOSIS — R002 Palpitations: Secondary | ICD-10-CM

## 2020-12-10 DIAGNOSIS — R079 Chest pain, unspecified: Secondary | ICD-10-CM

## 2020-12-10 DIAGNOSIS — I351 Nonrheumatic aortic (valve) insufficiency: Secondary | ICD-10-CM

## 2021-01-03 ENCOUNTER — Emergency Department
Admission: EM | Admit: 2021-01-03 | Discharge: 2021-01-04 | Disposition: A | Discharge status: Home / Self Care | Payer: Commercial Managed Care - PPO | Attending: Emergency Medicine | Admitting: Emergency Medicine

## 2021-01-03 ENCOUNTER — Emergency Department: Payer: Commercial Managed Care - PPO

## 2021-01-03 DIAGNOSIS — B029 Zoster without complications: Secondary | ICD-10-CM | POA: Insufficient documentation

## 2021-01-03 DIAGNOSIS — R1011 Right upper quadrant pain: Secondary | ICD-10-CM | POA: Insufficient documentation

## 2021-01-03 DIAGNOSIS — B028 Zoster with other complications: Secondary | ICD-10-CM

## 2021-01-03 DIAGNOSIS — R109 Unspecified abdominal pain: Secondary | ICD-10-CM

## 2021-01-03 MED ORDER — VALACYCLOVIR HCL 1 G PO TABS
1000.0000 mg | ORAL_TABLET | Freq: Three times a day (TID) | ORAL | 0 refills | Status: AC
Start: 2021-01-03 — End: 2021-01-10

## 2021-01-03 MED ORDER — KETOROLAC TROMETHAMINE 30 MG/ML IJ SOLN
30.0000 mg | Freq: Once | INTRAMUSCULAR | Status: AC
Start: 2021-01-03 — End: 2021-01-03
  Administered 2021-01-03: 30 mg via INTRAVENOUS
  Filled 2021-01-03: qty 1

## 2021-01-03 MED ORDER — VALACYCLOVIR HCL 500 MG PO TABS
1000.0000 mg | ORAL_TABLET | Freq: Once | ORAL | Status: AC
Start: 2021-01-03 — End: 2021-01-03
  Administered 2021-01-03: 1000 mg via ORAL
  Filled 2021-01-03: qty 2

## 2021-01-03 MED ORDER — MORPHINE SULFATE 2 MG/ML IJ/IV SOLN (WRAP)
4.0000 mg | Freq: Once | Status: AC
Start: 2021-01-03 — End: 2021-01-03
  Administered 2021-01-03: 4 mg via INTRAVENOUS
  Filled 2021-01-03: qty 2

## 2021-01-03 MED ORDER — ONDANSETRON HCL 4 MG/2ML IJ SOLN
4.0000 mg | Freq: Once | INTRAMUSCULAR | Status: AC
Start: 2021-01-03 — End: 2021-01-03
  Administered 2021-01-03: 4 mg via INTRAVENOUS
  Filled 2021-01-03: qty 2

## 2021-01-03 MED ORDER — ONDANSETRON 4 MG PO TBDP
4.0000 mg | ORAL_TABLET | Freq: Three times a day (TID) | ORAL | 0 refills | Status: AC | PRN
Start: 2021-01-03 — End: 2021-01-10

## 2021-01-03 NOTE — Discharge Instructions (Addendum)
Dear Ms. Racz:    Thank you for choosing the [facility] Emergency Department and it was a pleasure to be part of your care team. I hope your visit today was EXCELLENT.    Specific instructions for your visit today:    Please come back if symptoms worsen, fever, chills, dizziness, chest pain, or shortness of breath.     At the time of your discharge, you have been evaluated and determined not to be suffering from any life-threatening illnesses. However, it is possible that you have visited the emergency department in the early part of an illness and that your status may change, requiring further evaluation and possibly return to the emergency department.      We have discussed with you all your results and findings in the ER. We have provided you with all your results. Please review all the results with your primary care doctor as it may contain important information that may or may not be relevant to your visit today and may need additional follow up. We have discussed the risks and benefits to the medications that were prescribed today however please speak with your doctor for further information regarding the prescriptions you have received today.     If you do not continue to improve or your condition worsens, please contact your doctor or return immediately to the Emergency Department.    Sincerely,  Alyson Reedy, MD  Attending Emergency Physician  [facility] Emergency Department      OBTAINING A PRIMARY CARE APPOINTMENT    Primary care physicians (PCPs, also known as primary care doctors) are either internists or family medicine doctors. Both types of PCPs focus on health promotion, disease prevention, patient education and counseling, and treatment of acute and chronic medical conditions.    Call for an appointment with a primary care doctor.  Ask to see who is taking new patients.     [facility]  telephone:  [phone]  [email or web address]    DOCTOR  REFERRALS  Call [phone] (available 24 hours a day, 7 days a week) if you need any further referrals and we can help you find a primary care doctor or specialist.  Also, available online at:  [email or web address]    YOUR CONTACT INFORMATION  Before leaving please check with registration to make sure we have an up-to-date contact number.  You can call registration at [phone] to update your information.  For questions about your hospital bill, please call [phone].  For questions about your Emergency Dept Physician bill please call [phone].      FREE HEALTH SERVICES  If you need help with health or social services, please call 2-1-1 for a free referral to resources in your area.  2-1-1 is a free service connecting people with information on health insurance, free clinics, pregnancy, mental health, dental care, food assistance, housing, and substance abuse counseling.  Also, available online at:  http://www.[facility].org    MEDICAL RECORDS AND TESTS  Certain laboratory test results do not come back the same day, for example urine cultures.   We will contact you if other important findings are noted.  Radiology films are often reviewed again to ensure accuracy.  If there is any discrepancy, we will notify you.      Please call [phone] to pick up a complimentary CD of any radiology studies performed.  If you or your doctor would like to request a copy of your medical records, please call [phone].  ORTHOPEDIC INJURY   Please know that significant injuries can exist even when an initial x-ray is read as normal or negative.  This can occur because some fractures (broken bones) are not initially visible on x-rays.  For this reason, close outpatient follow-up with your primary care doctor or bone specialist (orthopedist) is required.    MEDICATIONS AND FOLLOWUP  Please be aware that some prescription medications can cause drowsiness.  Use caution when driving or operating  machinery.    The examination and treatment you have received in our Emergency Department is provided on an emergency basis, and is not intended to be a substitute for your primary care physician.  It is important that your doctor checks you again and that you report any new or remaining problems at that time.      [place]  The nearest 24 hour pharmacy is:    [facility] [place], [place] [address]  [place]  [facility])  Call to start or finish an application, compare plans, enroll or ask a question.  [place]: [phone]  Web:  Healthcare.gov    Help Enrolling in [place]  (585)815-7814 (TOLL-FREE)  219-669-6548 (TTY)  Web:  Http://www.coverva.org    Local Help Enrolling in the [place]  (937)745-9695 (MAIN)  Email:  health-help'@nvfs'$ .org  Web:  http://lewis-perez.info/  Address:  [id number] [place], [place] [address]    SEDATING MEDICATIONS  Sedating medications include strong pain medications (e.g. narcotics), muscle relaxers, benzodiazepines (used for anxiety and as muscle relaxers), Benadryl/diphenhydramine and other antihistamines for allergic reactions/itching, and other medications.  If you are unsure if you have received a sedating medication, please ask your physician or nurse.  If you received a sedating medication: DO NOT drive a car. DO NOT operate machinery. DO NOT perform jobs where you need to be alert.  DO NOT drink alcoholic beverages while taking this medicine.     If you get dizzy, sit or lie down at the first signs. Be careful going up and down stairs.  Be extra careful to prevent falls.     Never give this medicine to others.     Keep this medicine out of reach of children.     Do not take or save old medicines. Throw them away when outdated.     Keep all medicines in a cool, dry place. DO NOT keep them in your bathroom  medicine cabinet or in a cabinet above the stove.    MEDICATION REFILLS  Please be aware that we cannot refill any prescriptions through the ER. If you need further treatment from what is provided at your ER visit, please follow up with your primary care doctor or your pain management specialist.    [place]  Did you know [facility] has two freestanding ERs located just a few miles away?  [facility] [place] have short wait times, easy free parking directly in front of the building and top patient satisfaction scores - and the same Board Certified Emergency Medicine doctors as [facility].

## 2021-01-03 NOTE — ED Provider Notes (Signed)
[facility] EMERGENCY DEPARTMENT  ATTENDING PHYSICIAN HISTORY AND PHYSICAL EXAM     Patient Name: Chelsea Montoya, Chelsea Montoya  Encounter Date:  01/03/2021  Attending Physician: Alyson Reedy, MD  Room:  S F/S F  Patient DOB:  [birth year]  Age: 65 y.o. female  MRN:  [id number]  PCP: Dante Gang, MD         Assessment & Plan:     SILVA AAMODT is a 65 y.o. female w/ hx of Crohn's disease, HTN, arrhythmia, mitral valve prolapse who presents with right sided abdominal and flank pain. Was seen at [facility] had CT that was unremarkable. LFTs were normal. Cr nl. CBC negative. Pain is not in epigastric region. Her abdomen exam is actually soft and non tender. On undressed exam - noted to have vesicular rash that is consistent with shingles to the area of discomfort. The pain does not cross midline. No HA, fever, spinal sxs. Thus suspect localized to 1 or 2 dermatomes. There is no sudden onset pain, no hx of AAA, no pulsatile mass, no bruits on exam - thus don't suspect arterial process. Lower suspicion for intra-abdominal process including infection. US obtained from triage without obvious gallstones but some what limited    Long discussion with patient regarding sxs, ddx, and findings. We discussed role of imaging - however based on risk/benefits - we had a shared decision to hold imaging.    She is on lyrica - will speak with her GP if she has room to increase  Will continue oxycodone  Will give toradol and zofran as oxycodone makes her nauseated  Her pain improved  Would like to go home  Emphasized need to f/u with pcp for repeat evaluation and to uncover underlying issues - had the shingles shot already - of note she reports being stressed out    Strict return precautions reviewed           Diagnosis/Disposition:    Final Impression  Final diagnoses:   Abdominal pain, unspecified abdominal location   Herpes zoster with complication     Disposition  ED Disposition       ED Disposition   Discharge    Condition   --     Date/Time   Mon Jan 03, 2021 10:43 PM    Comment   [facility]har [facility] discharge to home/self care.    Condition at disposition: Stable               Follow up  [facility] Emergency Dept  [address]  [place] [address]  [phone]  In 2 days  If symptoms worsen  Prescriptions  Discharge Medication List as of 01/03/2021 10:51 PM        START taking these medications    Details   ondansetron (ZOFRAN-ODT) 4 MG disintegrating tablet Take 1 tablet (4 mg total) by mouth every 8 (eight) hours as needed for Nausea, Starting Mon 01/03/2021, Until Mon 01/10/2021 at 2359, E-Rx      valACYclovir HCL (VALTREX) 1000 MG tablet Take 1 tablet (1,000 mg total) by mouth 3 (three) times daily for 7 days, Starting Mon 01/03/2021, Until Mon 01/10/2021, E-Rx                History of Presenting Illness:     Nursing Triage note:      Chief complaint: Abdominal Pain and Back Pain    HPI  Chelsea Montoya is a very pleasant 65 y.o. female w/ hx Crohn's disease, HTN,  arrhythmia, mitral valve prolapse. She reports right sided abdominal pain and right back pain. Sxs worsened over the past day. Pain is constant. Moderate. Gradually started. Pt describes pain as "burning" and similar to when she had shingles before. No activity that makes the discomfort worse. She went to [facility], had labs that were normal, and CT without findings. She wanted to make sure it was not gallbladder attack thus came in. She is also on macrobid for possible UTI. She reports taking 2 oxycodone that was given to her.     She denies tearing back pain. Denies family hx of AAA  The pain was not sudden  She has no pleurisy, hemoptysis, chest pain, shortness of breath, fever, cough  No leg pain, swelling, extremity weakness/numbness  No other sxs reported       History obtained from: patient, review of prior chart  [facility] records reviewed           Review of Systems:  Physical Exam:     Review of Systems    All other systems reviewed and negative except what is  specifically documented above.       Pulse 82  BP 161/77  Resp 20  SpO2 94 %  Temp (!) 96.8 F (36 C)    General: Comfortable, NAD.  Head: Atraumatic, normocephalic.  Eyes: No conjunctival injection. No discharge. EOMI.   Neck: Supple, normal range of motion.  Respiratory/Chest: No respiratory distress. CTAB  Cardiovascular: RRR. No m/g/r. B/l RP & DP +2  Abdomen: NBS. Soft. Non-tender abdomen however hyperthesia noted along the rash site. No guarding/rebound. No distension. Negative Murphy's, Rovsing's. Absent pulsatile mass.  Upper Extremity: No edema.  Back: No spinous process or paravertebral tenderness.  Lower Extremity: No edema. No calf tenderness. Warm and well perfused  Neurological: Awake, alert, oriented x 3. Speech smooth. No facial asymmetry. Moving all extremities equally   Skin: Warm and dry.  Vesicular rash at the region of discomfort  Psychiatric: Normal affect. Normal insight.    The patient and/or family gave verbal consent for photography and electronic transmission using Epic Haiku app for the purposes of capturing clinical images into the patient's chart.         Diagnostic Results:     Laboratory Studies:    All lab values have been personally reviewed by me    Results       ** No results found for the last 24 hours. **            Radiology Studies:    US Abdomen Limited RUQ (includes GB, Liver, Pancreas, R Kidney, Aorta)   Final Result       Assessment limited by bowel gas. No abnormality identified to explain   patient's right upper quadrant pain. Consider further evaluation with CT   with oral and IV contrast.      Prince Solian, MD    01/03/2021 7:56 PM              Interpretations, Clinical Decision Tools and Critical Care:     O2 sat-           saturation: 94 %; Oxygen use: room air; Interpretation: Normal      The patient's past medical records, including those in Care Everywhere when necessary, were reviewed by me.        Procedures:   Procedures        Orders Placed During This  Visit:     Encounter Orders:  Orders Placed  This Encounter   Procedures    US Abdomen Limited RUQ (includes GB, Liver, Pancreas, R Kidney, Aorta)       Encounter Medications:  Medications   morphine injection 4 mg (4 mg Intravenous Given 01/03/21 2132)   ondansetron (ZOFRAN) injection 4 mg (4 mg Intravenous Given 01/03/21 2129)   ketorolac (TORADOL) injection 30 mg (30 mg Intravenous Given 01/03/21 2132)   valACYclovir HCL (VALTREX) tablet 1,000 mg (1,000 mg Oral Given 01/03/21 2137)           Allergies & Medications:     Allergies:  Sheis allergic to flexeril [cyclobenzaprine], darvon, latex, augmentin [amoxicillin-pot clavulanate], ciprofloxacin, and keflex [cephalexin].    Home Medications       Med List Status: In Progress Set By: Drucie Ip, RN at 01/03/2021  9:26 PM              albuterol sulfate HFA (PROVENTIL) 108 (90 Base) MCG/ACT inhaler     USE 1-2 PUFFS EVERY 4 HOURS AS NEEDED     ALPRAZolam (XANAX) 0.5 MG tablet     Take 0.5 mg by mouth nightly as needed     buPROPion XL (WELLBUTRIN XL) 300 MG 24 hr tablet     Take 300 mg by mouth daily     dicyclomine (BENTYL) 20 MG tablet     TAKE 1/2 TABLET BY MOUTH THREE TIMES A DAY AS NEEDED (10mg )     fluticasone-salmeterol (ADVAIR DISKUS) 250-50 MCG/DOSE Aerosol Pwdr, Breath Activated     as needed     ibuprofen (ADVIL) 200 MG tablet     Take 300 mg by mouth every 6 (six) hours as needed for Pain     lidocaine (LIDODERM) 5 %     Place 1 patch onto the skin every 24 hours Remove & Discard patch within 12 hours or as directed by MD     Loratadine-Pseudoephedrine (CLARITIN-D 12 HOUR PO)     Take by mouth as needed         meloxicam (MOBIC) 15 MG tablet     Take 15 mg by mouth daily     pregabalin (LYRICA) 100 MG capsule     Take 25 mg by mouth daily                 Past History:     Medical:   Past Medical History:   Diagnosis Date    Anemia     resolved    Anxiety     Arrhythmia     followed by cardio     Arthritis     osteoarthritis    Asthma without status  asthmaticus     allergy-induced only- LD 09/2017    Blood transfusion without reported diagnosis     14 years ago    Complication of anesthesia     wake up fast    Crohn's colitis 01/31/2012    Crohn's disease     remission - colonoscopy     Duplicated right renal collecting system     Heart murmur     followed by cardio     Hypertension     Runs 120/78's    Insomnia     Kidney stone     Mild stage glaucoma 2021    Mitral valve prolapse     Pneumonia     Pyelonephritis     Seizures     Just once in 1978- stress induced     Shortness of breath  R/T allergy    Thyroid nodule     BILATERAL 09/2014; RT LOBE THYROID NODULE BX - BENIGN    Vision abnormalities        Surgical: She has a past surgical history that includes APPENDECTOMY (OPEN) (2000's); Gynecologic cryosurgery (1970's); Colectomy (1988); Bunionectomy (Right, 1998); CLOSED REDUCTION, PERCUTANEOUS FIXATION, FINGER (10/28/2012); Colonoscopy w/ biopsies (02/2016 2019); Endometrial biopsy (04/22/2014); THYROID NODULE BIOPSY (09/2014); Knee Arthroplasty (Right, 2001, 2016); Knee Arthroplasty (Left, 2009); Elbow surgery (Right, 2010); FUSION, ANTERIOR CERVICAL, LEVELS 3+ (N/A, 12/26/2017); and INCISION & DRAINAGE, WOUND (N/A, 01/01/2018).    Family:   Family History   Problem Relation Age of Onset    Ovarian cancer Paternal Aunt 37        died of complications of ovarian ca    Lung cancer Mother         complications of lung ca    Kidney failure Father     Ovarian cancer Paternal Aunt         survivor    Uterine cancer Maternal Grandmother 45        survivior    Breast cancer Paternal Grandmother     Colon cancer Neg Hx        Social: She reports that she has never smoked. She has never used smokeless tobacco. She reports current alcohol use. She reports that she does not use drugs.        ATTESTATIONS     Alyson Reedy, MD      Documentation Notes:    Parts of this note were generated by the Epic EMR system/ Dragon speech recognition and may contain inherent errors  or omissions not intended by the user. Grammatical errors, random word insertions, deletions, pronoun errors and incomplete sentences are occasional consequences of this technology due to software limitations. Not all errors are caught or corrected.    My documentation is often completed after the patient is no longer under my clinical care. In some cases, the Epic EMR may pull updated results into the above documentation which may not reflect all results or information that was available to me at the time of my medical decision making.     If there are questions or concerns about the content of this note or information contained within the body of this dictation they should be addressed directly with the author for clarification.                    Alyson Reedy, MD  01/04/21 [phone]

## 2021-01-04 MED ORDER — VALACYCLOVIR HCL 1 G PO TABS
1000.0000 mg | ORAL_TABLET | Freq: Three times a day (TID) | ORAL | 0 refills | Status: AC
Start: 2021-01-04 — End: 2021-01-11

## 2021-01-31 NOTE — Progress Notes (Signed)
[place] [facility] C[facility] OFFICE PROGRESS NOTE    HRT [facility] OFFICE      [place] [facility] [place] OFF[facility] -CARDIOLOGY  [address]  [place] [address]  Dept: [phone]  Dept Fax: [phone]       Patient Name: Chelsea Montoya    Date of Visit:  December 10, 2020  Date of Birth: 08-30-1955  AGE: 65 y.o.  Medical Record #: [id number]  Requesting Physician: Dante Gang, MD      CHIEF COMPLAINT: No chief complaint on file.      HISTORY OF PRESENT ILLNESS:    She is a pleasant 65 y.o. female who presents today for routine follow-up.  She was last seen in December 2021.  Following that visit she underwent noninvasive cardiac testing including nuclear stress imaging, echocardiography, and carotid Dopplers.  She had evidence of normal myocardial perfusion without evidence of ischemia.  Her echocardiogram revealed normal left ventricular systolic function, aortic valve sclerosis, and mild aortic insufficiency.  On carotid Doppler she had evidence of less than 50% narrowing on the right.  The left carotid appeared unremarkable.    She continues to be followed at [facility] for her eye exams.  She reports that these have been stable.  She continues to do some walking.  No recent chest discomfort.  She had a brief episode of palpitations 3 months ago associated with chest "soreness", but this resolved.  She denies more recent recurrences.      PAST MEDICAL HISTORY: She has a past medical history of Anemia, Anxiety, Arrhythmia, Arthritis, Asthma without status asthmaticus, Blood transfusion without reported diagnosis, Complication of anesthesia, Crohn's colitis (01/31/2012), Crohn's disease, Duplicated right renal collecting system, Heart murmur, Hypertension, Insomnia, Kidney stone, Mild stage glaucoma (2021), Mitral valve prolapse, Pneumonia, Pyelonephritis, Seizures, Shortness of breath, Thyroid nodule, and Vision abnormalities. She has a past surgical history that includes APPENDECTOMY (OPEN)  (2000's); Gynecologic cryosurgery (1970's); Colectomy (1988); Bunionectomy (Right, 1998); CLOSED REDUCTION, PERCUTANEOUS FIXATION, FINGER (10/28/2012); Colonoscopy w/ biopsies (02/2016 2019); Endometrial biopsy (04/22/2014); THYROID NODULE BIOPSY (09/2014); Knee Arthroplasty (Right, 2001, 2016); Knee Arthroplasty (Left, 2009); Elbow surgery (Right, 2010); FUSION, ANTERIOR CERVICAL, LEVELS 3+ (N/A, 12/26/2017); and INCISION & DRAINAGE, WOUND (N/A, 01/01/2018).    ALLERGIES:   Allergies   Allergen Reactions    Flexeril [Cyclobenzaprine] Nausea And Vomiting     Flu like symptoms    Darvon Other (See Comments)     Gastric distress      Latex     Augmentin [Amoxicillin-Pot Clavulanate] Nausea And Vomiting    Ciprofloxacin Rash    Keflex [Cephalexin] Other (See Comments)     Gastric upset.       MEDICATIONS:   Patient's current medications were reviewed. ONLY Cardiac medications were updated unless others were addressed in assessment and plan.    Current Outpatient Medications:     albuterol sulfate HFA (PROVENTIL) 108 (90 Base) MCG/ACT inhaler, USE 1-2 PUFFS EVERY 4 HOURS AS NEEDED, Disp: , Rfl:     ALPRAZolam (XANAX) 0.5 MG tablet, Take 0.5 mg by mouth nightly as needed, Disp: , Rfl:     buPROPion XL (WELLBUTRIN XL) 300 MG 24 hr tablet, Take 300 mg by mouth daily, Disp: , Rfl:     dicyclomine (BENTYL) 20 MG tablet, TAKE 1/2 TABLET BY MOUTH THREE TIMES A DAY AS NEEDED (10mg ), Disp: , Rfl: 3    fluticasone-salmeterol (ADVAIR DISKUS) 250-50 MCG/DOSE Aerosol Pwdr, Breath Activated, as needed, Disp: , Rfl:  ibuprofen (ADVIL) 200 MG tablet, Take 300 mg by mouth every 6 (six) hours as needed for Pain, Disp: , Rfl:     lidocaine (LIDODERM) 5 %, Place 1 patch onto the skin every 24 hours Remove & Discard patch within 12 hours or as directed by MD, Disp: 15 each, Rfl: 0    Loratadine-Pseudoephedrine (CLARITIN-D 12 HOUR PO), Take by mouth as needed   , Disp: , Rfl:     meloxicam (MOBIC) 15 MG tablet, Take 15 mg by mouth daily, Disp: ,  Rfl:     pregabalin (LYRICA) 100 MG capsule, Take 25 mg by mouth daily, Disp: , Rfl:   No current facility-administered medications for this visit.    Facility-Administered Medications Ordered in Other Visits:     glucagon (rDNA) (GLUCAGEN) injection 1 mg, 1 mg, Intravenous, Once, Balba, Karrie Meres, MD     FAMILY HISTORY: family history includes Breast cancer in her paternal grandmother; Kidney failure in her father; Lung cancer in her mother; Ovarian cancer in her paternal aunt; Ovarian cancer (age of onset: 20) in her paternal aunt; Uterine cancer (age of onset: 12) in her maternal grandmother.    SOCIAL HISTORY: She reports that she has never smoked. She has never used smokeless tobacco. She reports current alcohol use. She reports that she does not use drugs.    PHYSICAL EXAMINATION    Visit Vitals  BP 126/86   Pulse 84   Ht 1.524 m (5')   Wt 88.5 kg (195 lb)   LMP 06/26/1993 (Approximate)   BMI 38.08 kg/m       General Appearance:  A well-appearing female in no acute distress.    Skin: Warm and dry to touch, no apparent skin lesions, or masses noted.  Head: Normocephalic, normal hair pattern, no masses or tenderness   Eyes: EOMS Intact, PERRL, conjunctivae and lids unremarkable.  ENT: Ears, Nose and throat reveal no gross abnormalities.  No pallor or cyanosis.  Dentition good.   Neck: JVP normal, no carotid bruit, thyroid not enlarged   Chest: Clear to auscultation bilaterally with good air movement and respiratory effort and no wheezes, rales, or rhonchi   Cardiovascular: Regular rhythm, S1 normal, S2 normal, No S3 or S4. Apical impulse not displaced.  Grade 2/6 early systolic ejection murmur. No gallops or rubs detected   Abdomen: Soft, nontender, nondistended, with normoactive bowel sounds. No organomegaly.  No pulsatile masses, or bruits.   Extremities: Warm without edema. No clubbing, or cyanosis. All peripheral pulses are full and equal.   Neuro: Alert and oriented x3. No gross motor or sensory deficits  noted, affect appropriate.            IMPRESSION:   Ms. Newkirk is a 65 y.o. female with the following problems:    Palpitations, with previously documented PVCs, recently asymptomatic  Chest discomfort, likely noncardiac.  Her recent exercise nuclear study in January revealed normal myocardial perfusion, indicating a low likelihood of significant coronary disease.  Murmur, secondary to aortic valve leaflet sclerosis and mild aortic insufficiency      RECOMMENDATIONS:    Continue current medical regimen without change  Return visit 1 year, or sooner if needed  Fasting lipid profile to be sure her LDL cholesterol remains below 70 mg/dL.  Orders Placed This Encounter   Procedures    Lipid panel    Office Visit (HRT [place])       No orders of the defined types were placed in this encounter.      SIGNED:    Thomas Hoff, MD          This note was generated by the Dragon speech recognition and may contain errors or omissions not intended by the user. Grammatical errors, random word insertions, deletions, pronoun errors, and incomplete sentences are occasional consequences of this technology due to software limitations. Not all errors are caught or corrected. If there are questions or concerns about the content of this note or information contained within the body of this dictation, they should be addressed directly with the author for clarification.

## 2021-08-22 ENCOUNTER — Ambulatory Visit: Payer: 59 | Admitting: Nurse Practitioner

## 2021-12-28 ENCOUNTER — Other Ambulatory Visit: Payer: Self-pay

## 2021-12-28 ENCOUNTER — Emergency Department (HOSPITAL_BASED_OUTPATIENT_CLINIC_OR_DEPARTMENT_OTHER)
Admission: EM | Admit: 2021-12-28 | Discharge: 2021-12-28 | Disposition: A | Discharge status: Home / Self Care | Payer: Federal, State, Local not specified - PPO | Attending: Emergency Medicine | Admitting: Emergency Medicine

## 2021-12-28 ENCOUNTER — Encounter (HOSPITAL_BASED_OUTPATIENT_CLINIC_OR_DEPARTMENT_OTHER): Payer: Self-pay

## 2021-12-28 ENCOUNTER — Emergency Department (HOSPITAL_BASED_OUTPATIENT_CLINIC_OR_DEPARTMENT_OTHER): Payer: Federal, State, Local not specified - PPO

## 2021-12-28 DIAGNOSIS — R197 Diarrhea, unspecified: Secondary | ICD-10-CM | POA: Insufficient documentation

## 2021-12-28 DIAGNOSIS — R109 Unspecified abdominal pain: Secondary | ICD-10-CM | POA: Diagnosis present

## 2021-12-28 DIAGNOSIS — N12 Tubulo-interstitial nephritis, not specified as acute or chronic: Secondary | ICD-10-CM | POA: Insufficient documentation

## 2021-12-28 LAB — BASIC METABOLIC PANEL
Anion gap: 9 (ref 5–15)
BUN: 8 mg/dL (ref 8–23)
CO2: 30 mmol/L (ref 22–32)
Calcium: 10.3 mg/dL (ref 8.9–10.3)
Chloride: 104 mmol/L (ref 98–111)
Creatinine, Ser: 0.7 mg/dL (ref 0.44–1.00)
GFR, Estimated: >60 mL/min (ref >60)
Glucose, Bld: 107 mg/dL — ABNORMAL HIGH (ref 70–99)
Potassium: 4.4 mmol/L (ref 3.5–5.1)
Sodium: 143 mmol/L (ref 135–145)

## 2021-12-28 LAB — URINALYSIS, ROUTINE W REFLEX MICROSCOPIC
Bilirubin Urine: NEGATIVE
Glucose, UA: NEGATIVE mg/dL
Hgb urine dipstick: NEGATIVE
Ketones, ur: NEGATIVE mg/dL
Leukocytes,Ua: NEGATIVE
Nitrite: POSITIVE — AB
Protein, ur: NEGATIVE mg/dL
Specific Gravity, Urine: 1.012 (ref 1.005–1.030)
pH: 5.5 (ref 5.0–8.0)

## 2021-12-28 LAB — CBC
HCT: 45 % (ref 36.0–46.0)
Hemoglobin: 14.6 g/dL (ref 12.0–15.0)
MCH: 28.6 pg (ref 26.0–34.0)
MCHC: 32.4 g/dL (ref 30.0–36.0)
MCV: 88.2 fL (ref 80.0–100.0)
Platelets: 234 10*3/uL (ref 150–400)
RBC: 5.1 MIL/uL (ref 3.87–5.11)
RDW: 13.2 % (ref 11.5–15.5)
WBC: 6.5 10*3/uL (ref 4.0–10.5)
nRBC: 0 % (ref 0.0–0.2)

## 2021-12-28 MED ORDER — MORPHINE SULFATE (PF) 4 MG/ML IV SOLN
4.0000 mg | Freq: Once | INTRAVENOUS | Status: AC
  Administered 2021-12-28: 4 mg via INTRAVENOUS
  Filled 2021-12-28: qty 1

## 2021-12-28 MED ORDER — ONDANSETRON HCL 4 MG/2ML IJ SOLN
4.0000 mg | Freq: Once | INTRAMUSCULAR | Status: AC
Start: 2021-12-28 — End: 2021-12-28
  Administered 2021-12-28: 4 mg via INTRAVENOUS
  Filled 2021-12-28: qty 2

## 2021-12-28 MED ORDER — SODIUM CHLORIDE 0.9 % IV SOLN
1.0000 g | Freq: Once | INTRAVENOUS | Status: AC
  Administered 2021-12-28: 1 g via INTRAVENOUS
  Filled 2021-12-28: qty 10

## 2021-12-28 MED ORDER — CEPHALEXIN 500 MG PO CAPS: 500.0000 mg | ORAL_CAPSULE | Freq: Four times a day (QID) | ORAL | 0 refills | Status: AC

## 2021-12-28 NOTE — ED Triage Notes (Signed)
Patient here POV from Home.  Endorses Diarrhea yesterday PM (Chron's Disease History) for which Patient treated with Bentyl. States Sunday AM she began to have Right Sided Flank pain that has continued since.  Mild Nausea. No Emesis. No Confirmed Fevers. No Urinary Symptoms besides Mild Color Change to Darker Urine.  History of Renal Stones and UTI. Was at Emergency Clinic and was told to seek ED Evaluation.  NAD Noted during Triage. A&Ox4. GCS 15. Ambulatory.

## 2021-12-28 NOTE — ED Provider Notes (Signed)
[facility] EMERGENCY DEPT Provider Note   CSN: [id number] Arrival date & time: 12/28/21  1341     History  Chief Complaint  Patient presents with   Flank Pain    Alyssa Franklin is a 66 y.o. female.  The history is provided by the patient and medical records. No language interpreter was used.  Flank Pain    66 year old female significant history of Crohn, kidney stone who presenting for evaluation of abdominal pain.  Patient report 4 days ago she developed acute onset of right flank pain.  Pain is sharp throbbing, waxing waning and moderate to severe in intensity.  She endorsed some mild nausea.  She also noticed some diarrhea as well.  She thought it felt very similar to prior kidney stones that she has had in the past.  She went to urgent care center for her complaints several days ago and was told that she may have a urinary tract infection.  She was prescribed Pyridium and Macrobid for which she has been taking without any relief.  She still having persistent pain keeping her up at night.  She felt the diarrhea could be related to a Crohn's flare due to the flank pain and she tries taking Bentyl without relief.  She did not notice any blood in her stool.  No fever or chills no chest pain shortness of breath or productive cough no dysuria increased urinary frequency or urgency.  She has had multiple kidney stones in the past.  She currently does not have a urologist.  Home Medications Prior to Admission medications   Medication Sig Start Date End Date Taking? Authorizing Provider  cyclobenzaprine (FLEXERIL) 10 MG tablet Take 1 tablet (10 mg total) by mouth 2 (two) times daily as needed for muscle spasms. 11/30/19   Gerhard Munch, MD  Diclofenac Epolamine 1.3 % PT24 Apply 1 patch topically 2 (two) times daily. 11/30/19   Shanon Ace, PA-C  HYDROcodone-acetaminophen (NORCO/VICODIN) 5-325 MG tablet Take 1 tablet by mouth every 6 (six) hours as needed for severe  pain. 11/30/19   Shanon Ace, PA-C  HYDROcodone-acetaminophen (NORCO/VICODIN) 5-325 MG tablet Take 2 tablets by mouth every 4 (four) hours as needed for severe pain. 11/30/19   Gerhard Munch, MD      Allergies    Sulfa antibiotics    Review of Systems   Review of Systems  Genitourinary:  Positive for flank pain.  All other systems reviewed and are negative.   Physical Exam Updated Vital Signs BP (!) 122/98 (BP Location: Right Arm)   Pulse 90   Temp 97.9 F (36.6 C)   Resp 18   Ht 5' (1.524 m)   Wt 82.6 kg   SpO2 98%   BMI 35.54 kg/m  Physical Exam Vitals and nursing note reviewed.  Constitutional:      General: She is not in acute distress.    Appearance: She is well-developed.  HENT:     Head: Atraumatic.  Eyes:     Conjunctiva/sclera: Conjunctivae normal.  Cardiovascular:     Rate and Rhythm: Normal rate and regular rhythm.     Pulses: Normal pulses.     Heart sounds: Normal heart sounds.  Pulmonary:     Effort: Pulmonary effort is normal.  Abdominal:     Palpations: Abdomen is soft.     Tenderness: There is no abdominal tenderness. There is right CVA tenderness. There is no left CVA tenderness.  Musculoskeletal:     Cervical back: Neck supple.  Skin:    Findings: No rash.  Neurological:     Mental Status: She is alert.  Psychiatric:        Mood and Affect: Mood normal.     ED Results / Procedures / Treatments   Labs (all labs ordered are listed, but only abnormal results are displayed) Labs Reviewed  URINALYSIS, ROUTINE W REFLEX MICROSCOPIC - Abnormal; Notable for the following components:      Result Value   Nitrite POSITIVE (*)    All other components within normal limits  BASIC METABOLIC PANEL - Abnormal; Notable for the following components:   Glucose, Bld 107 (*)    All other components within normal limits  URINE CULTURE  CBC    EKG None  Radiology CT Renal Stone Study  Result Date: 12/28/2021 CLINICAL DATA:  Flank pain,  kidney stone suspected EXAM: CT ABDOMEN AND PELVIS WITHOUT CONTRAST TECHNIQUE: Multidetector CT imaging of the abdomen and pelvis was performed following the standard protocol without IV contrast. RADIATION DOSE REDUCTION: This exam was performed according to the departmental dose-optimization program which includes automated exposure control, adjustment of the mA and/or kV according to patient size and/or use of iterative reconstruction technique. COMPARISON:  None Available. FINDINGS: Lower chest: No acute abnormality. Hepatobiliary: No focal liver abnormality is seen. No gallstones, gallbladder wall thickening, or biliary dilatation. Pancreas: Unremarkable. Spleen: Normal in size without focal abnormality. Adrenals/Urinary Tract: Adrenals are unremarkable. No hydronephrosis. Possible punctate nonobstructing calculus of the upper pole of the left kidney. Bladder is poorly distended but unremarkable. Stomach/Bowel: Stomach is within normal limits. Bowel is normal in caliber. Vascular/Lymphatic: Mild atherosclerosis.  No enlarged nodes. Reproductive: Uterus and bilateral adnexa are unremarkable. Other: No free fluid.  Small fat containing paraumbilical hernia. Musculoskeletal: Advanced degenerative changes of the included spine. IMPRESSION: No acute abnormality. Possible punctate left renal calculus. Electronically Signed   By: Guadlupe Spanish M.D.   On: 12/28/2021 17:57    Procedures Procedures    Medications Ordered in ED Medications  cefTRIAXone (ROCEPHIN) 1 g in sodium chloride 0.9 % 100 mL IVPB (0 g Intravenous Stopped 12/28/21 1730)  morphine (PF) 4 MG/ML injection 4 mg (4 mg Intravenous Given 12/28/21 1654)  ondansetron (ZOFRAN) injection 4 mg (4 mg Intravenous Given 12/28/21 1720)  morphine (PF) 4 MG/ML injection 4 mg (4 mg Intravenous Given 12/28/21 1833)    ED Course/ Medical Decision Making/ A&P                           Medical Decision Making Amount and/or Complexity of Data Reviewed Labs:  ordered. Radiology: ordered.  Risk Prescription drug management.   BP (!) 127/97   Pulse 86   Temp 97.9 F (36.6 C)   Resp 17   Ht 5' (1.524 m)   Wt 82.6 kg   SpO2 97%   BMI 35.54 kg/m   4:36 PM This is a 66 year old female significant history of kidney stones,And history of Crohn's disease presenting with cute onset of right flank pain that started about 3 to 4 days ago.  Pain is persistent, moderate in severity and now affecting his sleep.  She reports she was seen several days ago at an outside hospital for her symptoms.  At that time she was told that she has some evidence of blood in her urine and some evidence of a urinary tract infection and she was prescribed Pyridium and Macrobid for which she has been taking as prescribed with  minimal improvement.  She felt her pain is similar to prior kidney stone.  She also reported history of Crohn's disease and also endorsed having some nonbloody diarrhea in the past few days as well.  She tries taking Bentyl at home without adequate relief.  On exam, she does have right CVA tenderness concerning for obstructive nephrolithiasis therefore my plan is to obtain a CT renal stone study for further assessment.  Labs obtained independently viewed interpreted by me.  Urinalysis is positive for nitrite.  Urine culture sent, will initiate antibiotic which includes Rocephin. She has normal WBC, normal H&H, electrolyte panels are reassuring.    6:28 PM CT scan of the abdomen pelvis was obtained independently viewed interpreted by me and I agree with radiologist interpretation.  Fortunately it did not show any acute finding.  There is a possible punctate left renal calculus but this is not likely to contribute to patient's right side flank pain.  Patient was given Rocephin during this ER visit.  She initially received a dose of morphine which provide pain relief now she felt her pain is coming back, will give additional dose of pain medication but  anticipate patient can be discharged home with Keflex as treatment for her kidney infection.  Urine culture sent.  7:41 PM Patient reported improvement of symptoms after receiving the second dose of pain medication.  She request to be discharged home.  Have considered admission but in the setting of improvement of symptoms and patient can tolerate p.o. which she will be going home with Keflex as treatment for kidney infection.  Return precaution given.  I have considered patient's social determinants of health and my plan of care.         Final Clinical Impression(s) / ED Diagnoses Final diagnoses:  Pyelonephritis    Rx / DC Orders ED Discharge Orders          Ordered    cephALEXin (KEFLEX) 500 MG capsule  4 times daily        12/28/21 1940              Fayrene Helper, PA-C 12/28/21 1941    Long, Arlyss Repress, MD 01/02/22 [phone]

## 2021-12-28 NOTE — Discharge Instructions (Signed)
You have been evaluated for your symptoms.  Fortunately CT scan did not show any concerning finding.  Please take Keflex as treatment for kidney infection.  Follow-up closely with your doctor for further care.  Return if you have any concern

## 2021-12-28 NOTE — ED Notes (Signed)
RN provided AVS using Teachback Method. Patient verbalizes understanding of Discharge Instructions. Opportunity for Questioning and Answers were provided by RN. Patient Discharged from ED ambulatory to Home with family.  

## 2021-12-28 NOTE — ED Notes (Signed)
Patient transported to CT 

## 2021-12-29 LAB — URINE CULTURE: Culture: NO GROWTH

## 2022-10-06 ENCOUNTER — Ambulatory Visit: Payer: Medicare HMO | Attending: Nurse Practitioner | Admitting: Physical Therapy

## 2022-10-06 ENCOUNTER — Other Ambulatory Visit: Payer: Self-pay

## 2022-10-06 ENCOUNTER — Encounter: Payer: Self-pay | Admitting: Physical Therapy

## 2022-10-06 DIAGNOSIS — M62830 Muscle spasm of back: Secondary | ICD-10-CM | POA: Insufficient documentation

## 2022-10-06 DIAGNOSIS — R293 Abnormal posture: Secondary | ICD-10-CM

## 2022-10-06 DIAGNOSIS — M5459 Other low back pain: Secondary | ICD-10-CM | POA: Insufficient documentation

## 2022-10-06 NOTE — Therapy (Addendum)
OUTPATIENT PHYSICAL THERAPY THORACOLUMBAR EVALUATION   Patient Name: Alyssa Franklin MRN: [id number] DOB:06-Apr-1956, 67 y.o., female Today's Date: 10/06/2022  END OF SESSION:  PT End of Session - 10/06/22 1057     Visit Number 1    Number of Visits 8    Date for PT Re-Evaluation 01/04/23    PT Start Time 0908    PT Stop Time 0959    PT Time Calculation (min) 51 min    Activity Tolerance Patient tolerated treatment well    Behavior During Therapy WFL for tasks assessed/performed             Past Medical History:  Diagnosis Date   COPD (chronic obstructive pulmonary disease)    Crohn's disease    Depression    H/O neck surgery    H/O total knee replacement    Insomnia    OA (osteoarthritis)    History reviewed. No pertinent surgical history. There are no problems to display for this patient.   REFERRING PROVIDER: Roseanna Rainbow NP  REFERRING DIAG: Lumbar Spondylosis, arthropathy of facet joint.  Rationale for Evaluation and Treatment: Rehabilitation  THERAPY DIAG:  Other low back pain  Abnormal posture  Muscle spasm of back  ONSET DATE: Ongoing.  SUBJECTIVE:                                                                                                                                                                                           SUBJECTIVE STATEMENT: The patient presents to the clinic with c/o chronic low back pain that has worsened over recent months.  She states she has also lost 3 1/2 to 4 inches in height.  Her pain is rated at a 7-8/10 today.  Medication and heat can decrease her pain some.  Walking increases her pain.  Her sleep is disturbed by pain usually only to sleep about 4 hours before waking due to pain.  PERTINENT HISTORY:  H/o low back pain, OA, COPD, neck surgery (C3-7 fusion), bilateral TKA's, right RTC tear.    PAIN:  Are you having pain? Yes: NPRS scale: 8/10 Pain location: Left LB/SIJ. Pain description: ache, sore, throb, sharp,  numb shooting.   Aggravating factors: See above. Relieving factors: See above.  PRECAUTIONS: Recommend patient use cane at all times.  WEIGHT BEARING RESTRICTIONS: No  FALLS:  Has patient fallen in last 6 months? Yes. Number of falls 3.  Related to pain in left LB/hip region.  Recommend cane use at all times.  She has a cane that she uses sometimes.  LIVING ENVIRONMENT: Lives with: lives with their spouse Lives in: House/apartment Stairs: Perform a  non-reciprocating stair gait pattern. Has following equipment at home:No device today but has a cane at home.  OCCUPATION: Disabled.  PLOF: Independent with basic ADLs  PATIENT GOALS: Sleep through night.   OBJECTIVE:   DIAGNOSTIC FINDINGS:  11/30/19:  There are degenerative changes in the lumbar spine facet hypertrophy identified in the LOWER lumbar levels. Significant disc height loss and uncovertebral spurring at T12-L1, L1-L2. There is 7 millimeters anterolisthesis of L4 on L5, likely degenerative. There is moderate degenerative change in the LOWER thoracic spine.   No acute fracture or traumatic subluxation. Surgical clips are present in the central abdomen. Bowel gas pattern is nonobstructive.   IMPRESSION: 1. Degenerative changes.  Grade 1 anterolisthesis of L4 on L5. 2. No evidence for acute abnormality.   POSTURE: rounded shoulders, forward head, increased thoracic kyphosis, and right shoulder depression.  PALPATION: Increased tone with some tenderness over the patient's left lower lumbar region and chief complaint of pain is over her left SIJ.  LUMBAR ROM:   Full active lumbar flexion and active extension limited to 10 degrees.  LOWER EXTREMITY MMT:    Normal LE strength.  LUMBAR SPECIAL TESTS:  Equal leg lengths.  Pain with a left SLR test.  (-) FABER test.   GAIT: Patient walks in a flexed trunk posture.  TODAY'S TREATMENT:                                                                                                                               DATE: HMP and IFC at 80-150 Hz on 40% scan x 20 minutes to patient's left LB/SIJ region.   Patient tolerated treatment without complaint with normal modality response following removal of modality.   ASSESSMENT:  CLINICAL IMPRESSION: The patient presents to OPPT with c/o chronic left-sided low back pain that has worsened over recent months.  The pain disturbs her sleep and rises to very high levels with increased walking.  She demonstrates increased pain with a left SLR test.  She is tender over her left lower lumbar region but especially over her left SIJ.  She has reported some falls due to her pain and therefore it is recommended she use her cane at all times.  Patient will benefit from skilled physical therapy intervention to address pain and deficits.  OBJECTIVE IMPAIRMENTS: Abnormal gait, decreased activity tolerance, decreased ROM, increased muscle spasms, postural dysfunction, and pain.   ACTIVITY LIMITATIONS: carrying, lifting, bending, standing, and locomotion level  PARTICIPATION LIMITATIONS: meal prep, cleaning, laundry, and yard work  PERSONAL FACTORS: Time since onset of injury/illness/exacerbation are also affecting patient's functional outcome.   REHAB POTENTIAL: Fair .  CLINICAL DECISION MAKING: Evolving/moderate complexity  EVALUATION COMPLEXITY: Moderate   GOALS:  SHORT TERM GOALS: Target date: 11/03/22.  Ind with an initial HEP Goal status: INITIAL  2.  Sleep 6 hours undisturbed by pain.  Goal status: INITIAL  3.  Perform ADL's with pain not >4-5/10.  Goal status: INITIAL  PLAN:  PT FREQUENCY: 2x/week  PT DURATION: 4 weeks  PLANNED INTERVENTIONS: Therapeutic exercises, Therapeutic activity, Neuromuscular re-education, Patient/Family education, Self Care, Dry Needling, Electrical stimulation, Cryotherapy, Moist heat, Ultrasound, and Manual therapy.  PLAN FOR NEXT SESSION: STW/M and modalities as needed.   Low-level core exercise progression.  Body mechanics and spinal protection techniques.   Charrie Mcconnon, [place], PT 10/06/2022, 12:47 PM

## 2022-10-10 ENCOUNTER — Encounter: Payer: Self-pay | Admitting: *Deleted

## 2022-10-10 ENCOUNTER — Ambulatory Visit: Payer: Medicare HMO | Admitting: *Deleted

## 2022-10-10 DIAGNOSIS — M5459 Other low back pain: Secondary | ICD-10-CM | POA: Diagnosis not present

## 2022-10-10 DIAGNOSIS — R293 Abnormal posture: Secondary | ICD-10-CM

## 2022-10-10 DIAGNOSIS — M62830 Muscle spasm of back: Secondary | ICD-10-CM

## 2022-10-10 NOTE — Therapy (Signed)
OUTPATIENT PHYSICAL THERAPY THORACOLUMBAR TREATMENT   Patient Name: Alyssa Franklin MRN: [id number] DOB:04/03/1956, 67 y.o., female Today's Date: 10/10/2022  END OF SESSION:  PT End of Session - 10/10/22 1602     Visit Number 2    Number of Visits 8    Date for PT Re-Evaluation 01/04/23    PT Start Time 1600    PT Stop Time 1650    PT Time Calculation (min) 50 min             Past Medical History:  Diagnosis Date   COPD (chronic obstructive pulmonary disease)    Crohn's disease    Depression    H/O neck surgery    H/O total knee replacement    Insomnia    OA (osteoarthritis)    History reviewed. No pertinent surgical history. There are no problems to display for this patient.   REFERRING PROVIDER: Roseanna Rainbow NP  REFERRING DIAG: Lumbar Spondylosis, arthropathy of facet joint.  Rationale for Evaluation and Treatment: Rehabilitation  THERAPY DIAG:  Other low back pain  Abnormal posture  Muscle spasm of back  ONSET DATE: Ongoing.  SUBJECTIVE:                                                                                                                                                                                           SUBJECTIVE STATEMENT: The patient presents to the clinic with c/o chronic low back pain that has worsened over recent months. Sore after last RX     PERTINENT HISTORY:  H/o low back pain, OA, COPD, neck surgery (C3-7 fusion), bilateral TKA's, right RTC tear.    PAIN:  Are you having pain? Yes: NPRS scale: 8/10 Pain location: Left LB/SIJ. Pain description: ache, sore, throb, sharp, numb shooting.   Aggravating factors: See above. Relieving factors: See above.  PRECAUTIONS: Recommend patient use cane at all times.  WEIGHT BEARING RESTRICTIONS: No  FALLS:  Has patient fallen in last 6 months? Yes. Number of falls 3.  Related to pain in left LB/hip region.  Recommend cane use at all times.  She has a cane that she uses  sometimes.  LIVING ENVIRONMENT: Lives with: lives with their spouse Lives in: House/apartment Stairs: Perform a non-reciprocating stair gait pattern. Has following equipment at home:No device today but has a cane at home.  OCCUPATION: Disabled.  PLOF: Independent with basic ADLs  PATIENT GOALS: Sleep through night.   OBJECTIVE:   DIAGNOSTIC FINDINGS:  11/30/19:  There are degenerative changes in the lumbar spine facet hypertrophy identified in the LOWER lumbar levels. Significant disc height loss and uncovertebral spurring at T12-L1, L1-L2.  There is 7 millimeters anterolisthesis of L4 on L5, likely degenerative. There is moderate degenerative change in the LOWER thoracic spine.   No acute fracture or traumatic subluxation. Surgical clips are present in the central abdomen. Bowel gas pattern is nonobstructive.   IMPRESSION: 1. Degenerative changes.  Grade 1 anterolisthesis of L4 on L5. 2. No evidence for acute abnormality.   POSTURE: rounded shoulders, forward head, increased thoracic kyphosis, and right shoulder depression.  PALPATION: Increased tone with some tenderness over the patient's left lower lumbar region and chief complaint of pain is over her left SIJ.  LUMBAR ROM:   Full active lumbar flexion and active extension limited to 10 degrees.  LOWER EXTREMITY MMT:    Normal LE strength.  LUMBAR SPECIAL TESTS:  Equal leg lengths.  Pain with a left SLR test.  (-) FABER test.   GAIT: Patient walks in a flexed trunk posture.  TODAY'S TREATMENT:                                                                                                                              DATE:  [place] Combo x 14 mins at 1.5 w/cm2 to thoracolumbar paras Bil. RT side lying Manual STW to Bil.  Thoracolumbaar paras    HMP and IFC at 80-150 Hz on 40% scan x 20 minutes to patient's left LB/SIJ region.   Patient tolerated treatment without complaint with normal modality response following  removal of modality.   ASSESSMENT:  CLINICAL IMPRESSION: The patient presents today with increased pain in LB since last Rx. Pt was having Bil thoracolumbar pain today. Rx focused on decreasing her pain with [place] combo , STW as well as HMP with IFC. Pt did well with Rx and reports decreased pain and MM tension end of session.    OBJECTIVE IMPAIRMENTS: Abnormal gait, decreased activity tolerance, decreased ROM, increased muscle spasms, postural dysfunction, and pain.   ACTIVITY LIMITATIONS: carrying, lifting, bending, standing, and locomotion level  PARTICIPATION LIMITATIONS: meal prep, cleaning, laundry, and yard work  PERSONAL FACTORS: Time since onset of injury/illness/exacerbation are also affecting patient's functional outcome.   REHAB POTENTIAL: Fair .  CLINICAL DECISION MAKING: Evolving/moderate complexity  EVALUATION COMPLEXITY: Moderate   GOALS:  SHORT TERM GOALS: Target date: 11/03/22.  Ind with an initial HEP Goal status: INITIAL  2.  Sleep 6 hours undisturbed by pain.  Goal status: INITIAL  3.  Perform ADL's with pain not >4-5/10.  Goal status: INITIAL  PLAN:  PT FREQUENCY: 2x/week  PT DURATION: 4 weeks  PLANNED INTERVENTIONS: Therapeutic exercises, Therapeutic activity, Neuromuscular re-education, Patient/Family education, Self Care, Dry Needling, Electrical stimulation, Cryotherapy, Moist heat, Ultrasound, and Manual therapy.  PLAN FOR NEXT SESSION: STW/M and modalities as needed.  Low-level core exercise progression.  Body mechanics and spinal protection techniques.   Jowanda Heeg,CHRIS, PTA 10/10/2022, 5:54 PM

## 2022-10-12 ENCOUNTER — Encounter: Payer: Self-pay | Admitting: *Deleted

## 2022-10-12 ENCOUNTER — Ambulatory Visit: Payer: Medicare HMO | Admitting: *Deleted

## 2022-10-12 DIAGNOSIS — R293 Abnormal posture: Secondary | ICD-10-CM

## 2022-10-12 DIAGNOSIS — M5459 Other low back pain: Secondary | ICD-10-CM

## 2022-10-12 DIAGNOSIS — M62830 Muscle spasm of back: Secondary | ICD-10-CM

## 2022-10-12 NOTE — Therapy (Signed)
OUTPATIENT PHYSICAL THERAPY THORACOLUMBAR TREATMENT   Patient Name: Alyssa Franklin MRN: [id number] DOB:1955/07/30, 67 y.o., female Today's Date: 10/12/2022  END OF SESSION:  PT End of Session - 10/12/22 0809     Visit Number 3    Number of Visits 8    Date for PT Re-Evaluation 01/04/23    PT Start Time 0815    PT Stop Time 0905    PT Time Calculation (min) 50 min             Past Medical History:  Diagnosis Date   COPD (chronic obstructive pulmonary disease)    Crohn's disease    Depression    H/O neck surgery    H/O total knee replacement    Insomnia    OA (osteoarthritis)    History reviewed. No pertinent surgical history. There are no problems to display for this patient.   REFERRING PROVIDER: Roseanna Rainbow NP  REFERRING DIAG: Lumbar Spondylosis, arthropathy of facet joint.  Rationale for Evaluation and Treatment: Rehabilitation  THERAPY DIAG:  Other low back pain  Abnormal posture  Muscle spasm of back  ONSET DATE: Ongoing.  SUBJECTIVE:                                                                                                                                                                                           SUBJECTIVE STATEMENT: The patient reports decreased pain after last Rx 2-3/10     PERTINENT HISTORY:  H/o low back pain, OA, COPD, neck surgery (C3-7 fusion), bilateral TKA's, right RTC tear.    PAIN:  Are you having pain? Yes: NPRS scale: 2-3/10 Pain location: Left LB/SIJ. Pain description: ache, sore, throb, sharp, numb shooting.   Aggravating factors: See above. Relieving factors: See above.  PRECAUTIONS: Recommend patient use cane at all times.  WEIGHT BEARING RESTRICTIONS: No  FALLS:  Has patient fallen in last 6 months? Yes. Number of falls 3.  Related to pain in left LB/hip region.  Recommend cane use at all times.  She has a cane that she uses sometimes.  LIVING ENVIRONMENT: Lives with: lives with their spouse Lives in:  House/apartment Stairs: Perform a non-reciprocating stair gait pattern. Has following equipment at home:No device today but has a cane at home.  OCCUPATION: Disabled.  PLOF: Independent with basic ADLs  PATIENT GOALS: Sleep through night.   OBJECTIVE:   DIAGNOSTIC FINDINGS:  11/30/19:  There are degenerative changes in the lumbar spine facet hypertrophy identified in the LOWER lumbar levels. Significant disc height loss and uncovertebral spurring at T12-L1, L1-L2. There is 7 millimeters anterolisthesis of L4 on L5, likely degenerative. There is  moderate degenerative change in the LOWER thoracic spine.   No acute fracture or traumatic subluxation. Surgical clips are present in the central abdomen. Bowel gas pattern is nonobstructive.   IMPRESSION: 1. Degenerative changes.  Grade 1 anterolisthesis of L4 on L5. 2. No evidence for acute abnormality.   POSTURE: rounded shoulders, forward head, increased thoracic kyphosis, and right shoulder depression.  PALPATION: Increased tone with some tenderness over the patient's left lower lumbar region and chief complaint of pain is over her left SIJ.  LUMBAR ROM:   Full active lumbar flexion and active extension limited to 10 degrees.  LOWER EXTREMITY MMT:    Normal LE strength.  LUMBAR SPECIAL TESTS:  Equal leg lengths.  Pain with a left SLR test.  (-) FABER test.   GAIT: Patient walks in a flexed trunk posture.  TODAY'S TREATMENT:                                                                                                                              DATE:                                                    10-12-22 [place] Combo x 14 mins at 1.5 w/cm2 to thoracolumbar paras Bil. RT side lying Manual STW to Bil.  Thoracolumbar paras RT side lying   HMP and IFC at 80-150 Hz on 40% scan x 20 minutes to patient's left LB/SIJ region.   Patient tolerated treatment without complaint with normal modality response following removal of  modality.   ASSESSMENT:  CLINICAL IMPRESSION: The patient presents today with decreased pain in LB since last Rx. Pt still was having Bil thoracolumbar pain again today, but less. Rx focused on decreasing her pain / mm tension with [place] combo , STW as well as HMP with IFC. Pt did well  with Rx and reports decreased pain and MM tension end of session.    OBJECTIVE IMPAIRMENTS: Abnormal gait, decreased activity tolerance, decreased ROM, increased muscle spasms, postural dysfunction, and pain.   ACTIVITY LIMITATIONS: carrying, lifting, bending, standing, and locomotion level  PARTICIPATION LIMITATIONS: meal prep, cleaning, laundry, and yard work  PERSONAL FACTORS: Time since onset of injury/illness/exacerbation are also affecting patient's functional outcome.   REHAB POTENTIAL: Fair .  CLINICAL DECISION MAKING: Evolving/moderate complexity  EVALUATION COMPLEXITY: Moderate   GOALS:  SHORT TERM GOALS: Target date: 11/03/22.  Ind with an initial HEP Goal status: INITIAL  2.  Sleep 6 hours undisturbed by pain.  Goal status: INITIAL  3.  Perform ADL's with pain not >4-5/10.  Goal status: INITIAL  PLAN:  PT FREQUENCY: 2x/week  PT DURATION: 4 weeks  PLANNED INTERVENTIONS: Therapeutic exercises, Therapeutic activity, Neuromuscular re-education, Patient/Family education, Self Care, Dry Needling, Electrical stimulation, Cryotherapy, Moist heat, Ultrasound, and Manual therapy.  PLAN  FOR NEXT SESSION: STW/M and modalities as needed.  Low-level core exercise progression.  Body mechanics and spinal protection techniques.   Ailsa Mireles,CHRIS, PTA 10/12/2022, 9:18 AM

## 2022-10-17 ENCOUNTER — Encounter: Payer: Self-pay | Admitting: *Deleted

## 2022-10-17 ENCOUNTER — Ambulatory Visit: Payer: Medicare HMO | Admitting: *Deleted

## 2022-10-17 DIAGNOSIS — M5459 Other low back pain: Secondary | ICD-10-CM | POA: Diagnosis not present

## 2022-10-17 DIAGNOSIS — M62830 Muscle spasm of back: Secondary | ICD-10-CM

## 2022-10-17 DIAGNOSIS — R293 Abnormal posture: Secondary | ICD-10-CM

## 2022-10-17 NOTE — Therapy (Signed)
OUTPATIENT PHYSICAL THERAPY THORACOLUMBAR TREATMENT   Patient Name: Alyssa Franklin MRN: [id number] DOB:June 16, 1956, 67 y.o., female Today's Date: 10/17/2022  END OF SESSION:  PT End of Session - 10/17/22 0901     Visit Number 4    Number of Visits 8    Date for PT Re-Evaluation 01/04/23    PT Start Time 0900    PT Stop Time 0950    PT Time Calculation (min) 50 min             Past Medical History:  Diagnosis Date   COPD (chronic obstructive pulmonary disease)    Crohn's disease    Depression    H/O neck surgery    H/O total knee replacement    Insomnia    OA (osteoarthritis)    History reviewed. No pertinent surgical history. There are no problems to display for this patient.   REFERRING PROVIDER: Roseanna Rainbow NP  REFERRING DIAG: Lumbar Spondylosis, arthropathy of facet joint.  Rationale for Evaluation and Treatment: Rehabilitation  THERAPY DIAG:  Other low back pain  Muscle spasm of back  Abnormal posture  ONSET DATE: Ongoing.  SUBJECTIVE:                                                                                                                                                                                           SUBJECTIVE STATEMENT: The patient reports decreased pain after last Rx , but today5/10     PERTINENT HISTORY:  H/o low back pain, OA, COPD, neck surgery (C3-7 fusion), bilateral TKA's, right RTC tear.    PAIN:  Are you having pain? Yes: NPRS scale: 2-3/10 Pain location: Left LB/SIJ. Pain description: ache, sore, throb, sharp, numb shooting.   Aggravating factors: See above. Relieving factors: See above.  PRECAUTIONS: Recommend patient use cane at all times.  WEIGHT BEARING RESTRICTIONS: No  FALLS:  Has patient fallen in last 6 months? Yes. Number of falls 3.  Related to pain in left LB/hip region.  Recommend cane use at all times.  She has a cane that she uses sometimes.  LIVING ENVIRONMENT: Lives with: lives with their  spouse Lives in: House/apartment Stairs: Perform a non-reciprocating stair gait pattern. Has following equipment at home:No device today but has a cane at home.  OCCUPATION: Disabled.  PLOF: Independent with basic ADLs  PATIENT GOALS: Sleep through night.   OBJECTIVE:   DIAGNOSTIC FINDINGS:  11/30/19:  There are degenerative changes in the lumbar spine facet hypertrophy identified in the LOWER lumbar levels. Significant disc height loss and uncovertebral spurring at T12-L1, L1-L2. There is 7 millimeters anterolisthesis of L4 on L5, likely degenerative.  There is moderate degenerative change in the LOWER thoracic spine.   No acute fracture or traumatic subluxation. Surgical clips are present in the central abdomen. Bowel gas pattern is nonobstructive.   IMPRESSION: 1. Degenerative changes.  Grade 1 anterolisthesis of L4 on L5. 2. No evidence for acute abnormality.   POSTURE: rounded shoulders, forward head, increased thoracic kyphosis, and right shoulder depression.  PALPATION: Increased tone with some tenderness over the patient's left lower lumbar region and chief complaint of pain is over her left SIJ.  LUMBAR ROM:   Full active lumbar flexion and active extension limited to 10 degrees.  LOWER EXTREMITY MMT:    Normal LE strength.  LUMBAR SPECIAL TESTS:  Equal leg lengths.  Pain with a left SLR test.  (-) FABER test.   GAIT: Patient walks in a flexed trunk posture.  TODAY'S TREATMENT:                                                                                                                              DATE:                                                    10-17-22 [place] Combo x 14 mins at 1.5 w/cm2 to thoracolumbar paras Bil. RT side lying Manual STW to Bil.  Thoracolumbar paras RT side lying   HMP and IFC at 80-150 Hz on 40% scan x 20 minutes to patient's RT LB/SIJ region.   Patient tolerated treatment without complaint with normal modality response  following removal of modality.   ASSESSMENT:  CLINICAL IMPRESSION: Pt arrived today doing fair, but didn't sleep well. She was mainly hurting on the RT side today, but very tender during STW. Less pain on LT side today and tolerated STW much better. Pt reports that she feels like the bones are hurting. Decreased pain end of session.    OBJECTIVE IMPAIRMENTS: Abnormal gait, decreased activity tolerance, decreased ROM, increased muscle spasms, postural dysfunction, and pain.   ACTIVITY LIMITATIONS: carrying, lifting, bending, standing, and locomotion level  PARTICIPATION LIMITATIONS: meal prep, cleaning, laundry, and yard work  PERSONAL FACTORS: Time since onset of injury/illness/exacerbation are also affecting patient's functional outcome.   REHAB POTENTIAL: Fair .  CLINICAL DECISION MAKING: Evolving/moderate complexity  EVALUATION COMPLEXITY: Moderate   GOALS:  SHORT TERM GOALS: Target date: 11/03/22.  Ind with an initial HEP Goal status: INITIAL  2.  Sleep 6 hours undisturbed by pain.  Goal status: INITIAL  3.  Perform ADL's with pain not >4-5/10.  Goal status: INITIAL  PLAN:  PT FREQUENCY: 2x/week  PT DURATION: 4 weeks  PLANNED INTERVENTIONS: Therapeutic exercises, Therapeutic activity, Neuromuscular re-education, Patient/Family education, Self Care, Dry Needling, Electrical stimulation, Cryotherapy, Moist heat, Ultrasound, and Manual therapy.  PLAN FOR NEXT SESSION: STW/M and modalities as needed.  Low-level core exercise progression.  Body mechanics and spinal protection techniques.   Chip Canepa,CHRIS, PTA 10/17/2022, 10:42 AM

## 2022-10-19 ENCOUNTER — Encounter: Payer: Self-pay | Admitting: *Deleted

## 2022-10-19 ENCOUNTER — Ambulatory Visit: Payer: Medicare HMO | Admitting: *Deleted

## 2022-10-19 DIAGNOSIS — M62830 Muscle spasm of back: Secondary | ICD-10-CM

## 2022-10-19 DIAGNOSIS — M5459 Other low back pain: Secondary | ICD-10-CM | POA: Diagnosis not present

## 2022-10-19 DIAGNOSIS — R293 Abnormal posture: Secondary | ICD-10-CM

## 2022-10-19 NOTE — Therapy (Signed)
OUTPATIENT PHYSICAL THERAPY THORACOLUMBAR TREATMENT   Patient Name: Alyssa Franklin MRN: [id number] DOB:Jun 16, 1956, 67 y.o., female Today's Date: 10/19/2022  END OF SESSION:  PT End of Session - 10/19/22 0907     Visit Number 5    Number of Visits 8    Date for PT Re-Evaluation 01/04/23    PT Start Time 0900    PT Stop Time 0951    PT Time Calculation (min) 51 min             Past Medical History:  Diagnosis Date   COPD (chronic obstructive pulmonary disease)    Crohn's disease    Depression    H/O neck surgery    H/O total knee replacement    Insomnia    OA (osteoarthritis)    History reviewed. No pertinent surgical history. There are no problems to display for this patient.   REFERRING PROVIDER: Roseanna Rainbow NP  REFERRING DIAG: Lumbar Spondylosis, arthropathy of facet joint.  Rationale for Evaluation and Treatment: Rehabilitation  THERAPY DIAG:  Other low back pain  Muscle spasm of back  Abnormal posture  ONSET DATE: Ongoing.  SUBJECTIVE:                                                                                                                                                                                           SUBJECTIVE STATEMENT: The patient reports decreased pain after last Rx and still doing better. 3/10     PERTINENT HISTORY:  H/o low back pain, OA, COPD, neck surgery (C3-7 fusion), bilateral TKA's, right RTC tear.    PAIN:  Are you having pain? Yes: NPRS scale: 2-3/10 Pain location: Left LB/SIJ. Pain description: ache, sore, throb, sharp, numb shooting.   Aggravating factors: See above. Relieving factors: See above.  PRECAUTIONS: Recommend patient use cane at all times.  WEIGHT BEARING RESTRICTIONS: No  FALLS:  Has patient fallen in last 6 months? Yes. Number of falls 3.  Related to pain in left LB/hip region.  Recommend cane use at all times.  She has a cane that she uses sometimes.  LIVING ENVIRONMENT: Lives with: lives with  their spouse Lives in: House/apartment Stairs: Perform a non-reciprocating stair gait pattern. Has following equipment at home:No device today but has a cane at home.  OCCUPATION: Disabled.  PLOF: Independent with basic ADLs  PATIENT GOALS: Sleep through night.   OBJECTIVE:   DIAGNOSTIC FINDINGS:  11/30/19:  There are degenerative changes in the lumbar spine facet hypertrophy identified in the LOWER lumbar levels. Significant disc height loss and uncovertebral spurring at T12-L1, L1-L2. There is 7 millimeters anterolisthesis of L4 on L5,  likely degenerative. There is moderate degenerative change in the LOWER thoracic spine.   No acute fracture or traumatic subluxation. Surgical clips are present in the central abdomen. Bowel gas pattern is nonobstructive.   IMPRESSION: 1. Degenerative changes.  Grade 1 anterolisthesis of L4 on L5. 2. No evidence for acute abnormality.   POSTURE: rounded shoulders, forward head, increased thoracic kyphosis, and right shoulder depression.  PALPATION: Increased tone with some tenderness over the patient's left lower lumbar region and chief complaint of pain is over her left SIJ.  LUMBAR ROM:   Full active lumbar flexion and active extension limited to 10 degrees.  LOWER EXTREMITY MMT:    Normal LE strength.  LUMBAR SPECIAL TESTS:  Equal leg lengths.  Pain with a left SLR test.  (-) FABER test.   GAIT: Patient walks in a flexed trunk posture.  TODAY'S TREATMENT:                                                                                                                              DATE:                                                    10-19-22 [place] Combo x 12  mins at 1.5 w/cm2 to thoracolumbar paras RT side RT side lying Manual STW to Bil.  Thoracolumbar paras RT side in RT side lying   HMP and IFC at 80-150 Hz on 40% scan x 15 minutes to patient's RT side paras.    Patient tolerated treatment without complaint with normal  modality response following removal of modality.   ASSESSMENT:  CLINICAL IMPRESSION: Pt arrived today doing better with  She was mainly hurting on the RT side today again but doing much better with less pain. Decreased tenderness during STW. Less pain on LT and RT side today and tolerated STW much better.  Decreased pain end of session.    OBJECTIVE IMPAIRMENTS: Abnormal gait, decreased activity tolerance, decreased ROM, increased muscle spasms, postural dysfunction, and pain.   ACTIVITY LIMITATIONS: carrying, lifting, bending, standing, and locomotion level  PARTICIPATION LIMITATIONS: meal prep, cleaning, laundry, and yard work  PERSONAL FACTORS: Time since onset of injury/illness/exacerbation are also affecting patient's functional outcome.   REHAB POTENTIAL: Fair .  CLINICAL DECISION MAKING: Evolving/moderate complexity  EVALUATION COMPLEXITY: Moderate   GOALS:  SHORT TERM GOALS: Target date: 11/03/22.  Ind with an initial HEP Goal status: On going  2.  Sleep 6 hours undisturbed by pain.  Goal status: On going  3.  Perform ADL's with pain not >4-5/10.  Goal status: On going  PLAN:  PT FREQUENCY: 2x/week  PT DURATION: 4 weeks  PLANNED INTERVENTIONS: Therapeutic exercises, Therapeutic activity, Neuromuscular re-education, Patient/Family education, Self Care, Dry Needling, Electrical stimulation, Cryotherapy, Moist heat, Ultrasound, and Manual therapy.  PLAN FOR NEXT SESSION: STW/M and modalities as needed.  Low-level core exercise progression.  Body mechanics and spinal protection techniques.   Tyreisha Ungar,CHRIS, PTA 10/19/2022, 10:29 AM

## 2022-10-24 ENCOUNTER — Encounter: Payer: Federal, State, Local not specified - PPO | Admitting: *Deleted

## 2022-10-26 ENCOUNTER — Encounter: Payer: Self-pay | Admitting: *Deleted

## 2022-10-26 ENCOUNTER — Ambulatory Visit: Payer: Medicare HMO | Attending: Nurse Practitioner | Admitting: *Deleted

## 2022-10-26 DIAGNOSIS — M5459 Other low back pain: Secondary | ICD-10-CM | POA: Diagnosis present

## 2022-10-26 DIAGNOSIS — R293 Abnormal posture: Secondary | ICD-10-CM | POA: Insufficient documentation

## 2022-10-26 DIAGNOSIS — M62830 Muscle spasm of back: Secondary | ICD-10-CM | POA: Diagnosis present

## 2022-10-26 NOTE — Therapy (Signed)
OUTPATIENT PHYSICAL THERAPY THORACOLUMBAR TREATMENT   Patient Name: Alyssa Franklin MRN: [id number] DOB:01/08/1956, 67 y.o., female Today's Date: 10/26/2022  END OF SESSION:  PT End of Session - 10/26/22 1430     Visit Number 6    Number of Visits 8    Date for PT Re-Evaluation 01/04/23    PT Start Time 1430    PT Stop Time 1520    PT Time Calculation (min) 50 min             Past Medical History:  Diagnosis Date   COPD (chronic obstructive pulmonary disease) (HCC)    Crohn's disease (HCC)    Depression    H/O neck surgery    H/O total knee replacement    Insomnia    OA (osteoarthritis)    History reviewed. No pertinent surgical history. There are no problems to display for this patient.   REFERRING PROVIDER: Roseanna Rainbow NP  REFERRING DIAG: Lumbar Spondylosis, arthropathy of facet joint.  Rationale for Evaluation and Treatment: Rehabilitation  THERAPY DIAG:  Other low back pain  Muscle spasm of back  Abnormal posture  ONSET DATE: Ongoing.  SUBJECTIVE:                                                                                                                                                                                           SUBJECTIVE STATEMENT: The patient reports increased pain for the last 3 days due to stress. Dog passed away. 7/10 today     PERTINENT HISTORY:  H/o low back pain, OA, COPD, neck surgery (C3-7 fusion), bilateral TKA's, right RTC tear.    PAIN:  Are you having pain? Yes: NPRS scale: 7/10 Pain location: Left LB/SIJ. Pain description: ache, sore, throb, sharp, numb shooting.   Aggravating factors: See above. Relieving factors: See above.  PRECAUTIONS: Recommend patient use cane at all times.  WEIGHT BEARING RESTRICTIONS: No  FALLS:  Has patient fallen in last 6 months? Yes. Number of falls 3.  Related to pain in left LB/hip region.  Recommend cane use at all times.  She has a cane that she uses sometimes.  LIVING  ENVIRONMENT: Lives with: lives with their spouse Lives in: House/apartment Stairs: Perform a non-reciprocating stair gait pattern. Has following equipment at home:No device today but has a cane at home.  OCCUPATION: Disabled.  PLOF: Independent with basic ADLs  PATIENT GOALS: Sleep through night.   OBJECTIVE:   DIAGNOSTIC FINDINGS:  11/30/19:  There are degenerative changes in the lumbar spine facet hypertrophy identified in the LOWER lumbar levels. Significant disc height loss and uncovertebral spurring at T12-L1, L1-L2. There is  7 millimeters anterolisthesis of L4 on L5, likely degenerative. There is moderate degenerative change in the LOWER thoracic spine.   No acute fracture or traumatic subluxation. Surgical clips are present in the central abdomen. Bowel gas pattern is nonobstructive.   IMPRESSION: 1. Degenerative changes.  Grade 1 anterolisthesis of L4 on L5. 2. No evidence for acute abnormality.   POSTURE: rounded shoulders, forward head, increased thoracic kyphosis, and right shoulder depression.  PALPATION: Increased tone with some tenderness over the patient's left lower lumbar region and chief complaint of pain is over her left SIJ.  LUMBAR ROM:   Full active lumbar flexion and active extension limited to 10 degrees.  LOWER EXTREMITY MMT:    Normal LE strength.  LUMBAR SPECIAL TESTS:  Equal leg lengths.  Pain with a left SLR test.  (-) FABER test.   GAIT: Patient walks in a flexed trunk posture.  TODAY'S TREATMENT:                                                                                                                              DATE:                                                    10-26-22 [place] Combo x 12  mins at 1.5 w/cm2 to thoracolumbar paras RT side RT side lying Manual STW to Bil.  Thoracolumbar paras (RT >LT side) in RT side lying   HMP and IFC at 80-150 Hz on 40% scan x 15 minutes to patient's RT side paras.    Patient tolerated  treatment without complaint with normal modality response following removal of modality.   ASSESSMENT:  CLINICAL IMPRESSION: Pt arrived today doing  worse with increased pain. She has had increased pain and unable to sleep well the last 3 nights.  She had notable increased soreness RT mid-back during STW. Good relief with Rx end of session.Try therex next RX if Pt is able. No goals met this week due to pain.  OBJECTIVE IMPAIRMENTS: Abnormal gait, decreased activity tolerance, decreased ROM, increased muscle spasms, postural dysfunction, and pain.   ACTIVITY LIMITATIONS: carrying, lifting, bending, standing, and locomotion level  PARTICIPATION LIMITATIONS: meal prep, cleaning, laundry, and yard work  PERSONAL FACTORS: Time since onset of injury/illness/exacerbation are also affecting patient's functional outcome.   REHAB POTENTIAL: Fair .  CLINICAL DECISION MAKING: Evolving/moderate complexity  EVALUATION COMPLEXITY: Moderate   GOALS:  SHORT TERM GOALS: Target date: 11/03/22.  Ind with an initial HEP Goal status: On going  2.  Sleep 6 hours undisturbed by pain.  Goal status: On going  3.  Perform ADL's with pain not >4-5/10.  Goal status: On going  PLAN:  PT FREQUENCY: 2x/week  PT DURATION: 4 weeks  PLANNED INTERVENTIONS: Therapeutic exercises, Therapeutic activity, Neuromuscular re-education, Patient/Family education,  Self Care, Dry Needling, Electrical stimulation, Cryotherapy, Moist heat, Ultrasound, and Manual therapy.  PLAN FOR NEXT SESSION: STW/M and modalities as needed.  Low-level core exercise progression.  Body mechanics and spinal protection techniques.   Lorrena Goranson,CHRIS, PTA 10/26/2022, 3:26 PM

## 2022-11-02 ENCOUNTER — Ambulatory Visit: Payer: Medicare HMO | Admitting: *Deleted

## 2022-11-03 ENCOUNTER — Ambulatory Visit: Payer: Medicare HMO | Admitting: *Deleted

## 2022-11-03 DIAGNOSIS — M5459 Other low back pain: Secondary | ICD-10-CM | POA: Diagnosis not present

## 2022-11-03 DIAGNOSIS — R293 Abnormal posture: Secondary | ICD-10-CM

## 2022-11-03 DIAGNOSIS — M62830 Muscle spasm of back: Secondary | ICD-10-CM

## 2022-11-03 NOTE — Therapy (Signed)
OUTPATIENT PHYSICAL THERAPY THORACOLUMBAR TREATMENT   Patient Name: Alyssa Franklin MRN: [id number] DOB:[birth year], 67 y.o., female Today's Date: 11/03/2022  END OF SESSION:  PT End of Session - 11/03/22 0944     Visit Number 7    Number of Visits 8    Date for PT Re-Evaluation 01/04/23    PT Start Time 0905    PT Stop Time 0950    PT Time Calculation (min) 45 min             Past Medical History:  Diagnosis Date   COPD (chronic obstructive pulmonary disease) (HCC)    Crohn's disease (HCC)    Depression    H/O neck surgery    H/O total knee replacement    Insomnia    OA (osteoarthritis)    No past surgical history on file. There are no problems to display for this patient.   REFERRING PROVIDER: Roseanna Rainbow NP  REFERRING DIAG: Lumbar Spondylosis, arthropathy of facet joint.  Rationale for Evaluation and Treatment: Rehabilitation  THERAPY DIAG:  Other low back pain  Muscle spasm of back  Abnormal posture  ONSET DATE: Ongoing.  SUBJECTIVE:                                                                                                                                                                                           SUBJECTIVE STATEMENT: The patient reports increased pain for the last 3 days due to stress. Dog passed away. 7/10 today     PERTINENT HISTORY:  H/o low back pain, OA, COPD, neck surgery (C3-7 fusion), bilateral TKA's, right RTC tear.    PAIN:  Are you having pain? Yes: NPRS scale: 7/10 Pain location: Left LB/SIJ. Pain description: ache, sore, throb, sharp, numb shooting.   Aggravating factors: See above. Relieving factors: See above.  PRECAUTIONS: Recommend patient use cane at all times.  WEIGHT BEARING RESTRICTIONS: No  FALLS:  Has patient fallen in last 6 months? Yes. Number of falls 3.  Related to pain in left LB/hip region.  Recommend cane use at all times.  She has a cane that she uses sometimes.  LIVING ENVIRONMENT: Lives  with: lives with their spouse Lives in: House/apartment Stairs: Perform a non-reciprocating stair gait pattern. Has following equipment at home:No device today but has a cane at home.  OCCUPATION: Disabled.  PLOF: Independent with basic ADLs  PATIENT GOALS: Sleep through night.   OBJECTIVE:   DIAGNOSTIC FINDINGS:  11/30/19:  There are degenerative changes in the lumbar spine facet hypertrophy identified in the LOWER lumbar levels. Significant disc height loss and uncovertebral spurring at T12-L1, L1-L2. There is  7 millimeters anterolisthesis of L4 on L5, likely degenerative. There is moderate degenerative change in the LOWER thoracic spine.   No acute fracture or traumatic subluxation. Surgical clips are present in the central abdomen. Bowel gas pattern is nonobstructive.   IMPRESSION: 1. Degenerative changes.  Grade 1 anterolisthesis of L4 on L5. 2. No evidence for acute abnormality.   POSTURE: rounded shoulders, forward head, increased thoracic kyphosis, and right shoulder depression.  PALPATION: Increased tone with some tenderness over the patient's left lower lumbar region and chief complaint of pain is over her left SIJ.  LUMBAR ROM:   Full active lumbar flexion and active extension limited to 10 degrees.  LOWER EXTREMITY MMT:    Normal LE strength.  LUMBAR SPECIAL TESTS:  Equal leg lengths.  Pain with a left SLR test.  (-) FABER test.   GAIT: Patient walks in a flexed trunk posture.  TODAY'S TREATMENT:                                                                                                                              DATE:                                                    11-03-22 [place] Combo x 12  mins at 1.5 w/cm2 to thoracolumbar paras RT side RT side lying Manual STW to Bil.  Thoracolumbar paras (RT >LT side) in RT side lying   HMP and IFC at 80-150 Hz on 40% scan x 15 minutes to patient's RT side paras.    Patient tolerated treatment without  complaint with normal modality response following removal of modality.   ASSESSMENT:  CLINICAL IMPRESSION: Pt arrived today not doing well again due to not sleeping well. Her pain level was up and preferred not to perform any exs. Rx focused again on pain reduction with [place] combo, STW as well as IFC. Decreased pain end of session.LTG's are ongoing due to pain     OBJECTIVE IMPAIRMENTS: Abnormal gait, decreased activity tolerance, decreased ROM, increased muscle spasms, postural dysfunction, and pain.   ACTIVITY LIMITATIONS: carrying, lifting, bending, standing, and locomotion level  PARTICIPATION LIMITATIONS: meal prep, cleaning, laundry, and yard work  PERSONAL FACTORS: Time since onset of injury/illness/exacerbation are also affecting patient's functional outcome.   REHAB POTENTIAL: Fair .  CLINICAL DECISION MAKING: Evolving/moderate complexity  EVALUATION COMPLEXITY: Moderate   GOALS:  SHORT TERM GOALS: Target date: 11/03/22.  Ind with an initial HEP Goal status: On going  2.  Sleep 6 hours undisturbed by pain.  Goal status: On going  3.  Perform ADL's with pain not >4-5/10.  Goal status: On going  PLAN:  PT FREQUENCY: 2x/week  PT DURATION: 4 weeks  PLANNED INTERVENTIONS: Therapeutic exercises, Therapeutic activity, Neuromuscular re-education, Patient/Family education, Self Care, Dry Needling,  Electrical stimulation, Cryotherapy, Moist heat, Ultrasound, and Manual therapy.  PLAN FOR NEXT SESSION: STW/M and modalities as needed.  Low-level core exercise progression.  Body mechanics and spinal protection techniques.   Hazael Olveda,CHRIS, PTA 11/03/2022, 12:40 PM

## 2022-11-07 ENCOUNTER — Encounter: Payer: Self-pay | Admitting: *Deleted

## 2022-11-07 ENCOUNTER — Ambulatory Visit: Payer: Medicare HMO | Admitting: *Deleted

## 2022-11-07 DIAGNOSIS — M62830 Muscle spasm of back: Secondary | ICD-10-CM

## 2022-11-07 DIAGNOSIS — R293 Abnormal posture: Secondary | ICD-10-CM

## 2022-11-07 DIAGNOSIS — M5459 Other low back pain: Secondary | ICD-10-CM

## 2022-11-07 NOTE — Therapy (Signed)
OUTPATIENT PHYSICAL THERAPY THORACOLUMBAR TREATMENT   Patient Name: Alyssa Franklin MRN: [id number] DOB:March 22, 1956, 67 y.o., female Today's Date: 11/07/2022  END OF SESSION:  PT End of Session - 11/07/22 0904     Visit Number 8    Number of Visits 8    Date for PT Re-Evaluation 01/04/23    PT Start Time 0900    PT Stop Time 0950    PT Time Calculation (min) 50 min             Past Medical History:  Diagnosis Date   COPD (chronic obstructive pulmonary disease) (HCC)    Crohn's disease (HCC)    Depression    H/O neck surgery    H/O total knee replacement    Insomnia    OA (osteoarthritis)    History reviewed. No pertinent surgical history. There are no problems to display for this patient.   REFERRING PROVIDER: Roseanna Rainbow NP  REFERRING DIAG: Lumbar Spondylosis, arthropathy of facet joint.  Rationale for Evaluation and Treatment: Rehabilitation  THERAPY DIAG:  Other low back pain  Muscle spasm of back  Abnormal posture  ONSET DATE: Ongoing.  SUBJECTIVE:                                                                                                                                                                                           SUBJECTIVE STATEMENT: The patient reports increased pain for the last 3 days due to stress. Dog passed away. 7/10 today     PERTINENT HISTORY:  H/o low back pain, OA, COPD, neck surgery (C3-7 fusion), bilateral TKA's, right RTC tear.    PAIN:  Are you having pain? Yes: NPRS scale: 7/10 Pain location: Left LB/SIJ. Pain description: ache, sore, throb, sharp, numb shooting.   Aggravating factors: See above. Relieving factors: See above.  PRECAUTIONS: Recommend patient use cane at all times.  WEIGHT BEARING RESTRICTIONS: No  FALLS:  Has patient fallen in last 6 months? Yes. Number of falls 3.  Related to pain in left LB/hip region.  Recommend cane use at all times.  She has a cane that she uses sometimes.  LIVING  ENVIRONMENT: Lives with: lives with their spouse Lives in: House/apartment Stairs: Perform a non-reciprocating stair gait pattern. Has following equipment at home:No device today but has a cane at home.  OCCUPATION: Disabled.  PLOF: Independent with basic ADLs  PATIENT GOALS: Sleep through night.   OBJECTIVE:   DIAGNOSTIC FINDINGS:  11/30/19:  There are degenerative changes in the lumbar spine facet hypertrophy identified in the LOWER lumbar levels. Significant disc height loss and uncovertebral spurring at T12-L1, L1-L2. There is  7 millimeters anterolisthesis of L4 on L5, likely degenerative. There is moderate degenerative change in the LOWER thoracic spine.   No acute fracture or traumatic subluxation. Surgical clips are present in the central abdomen. Bowel gas pattern is nonobstructive.   IMPRESSION: 1. Degenerative changes.  Grade 1 anterolisthesis of L4 on L5. 2. No evidence for acute abnormality.   POSTURE: rounded shoulders, forward head, increased thoracic kyphosis, and right shoulder depression.  PALPATION: Increased tone with some tenderness over the patient's left lower lumbar region and chief complaint of pain is over her left SIJ.  LUMBAR ROM:   Full active lumbar flexion and active extension limited to 10 degrees.  LOWER EXTREMITY MMT:    Normal LE strength.  LUMBAR SPECIAL TESTS:  Equal leg lengths.  Pain with a left SLR test.  (-) FABER test.   GAIT: Patient walks in a flexed trunk posture.  TODAY'S TREATMENT:                                                                                                                              DATE:                                                    11-07-22 AB bracing x10 hold 5 secs. Discussed to perform during transitional movements to decrease pain Red tband rows x 10 hold 5 secs Shldr ext with Red tband Handout given for HEP  [place] Combo x 12  mins at 1.5 w/cm2 to thoracolumbar paras RT side RT side  lying Manual STW to Bil.  Thoracolumbar paras (RT >LT side) in RT side lying   HMP and IFC at 80-150 Hz on 40% scan x 15 minutes to patient's RT side paras.    Patient tolerated treatment without complaint with normal modality response following removal of modality.   ASSESSMENT:  CLINICAL IMPRESSION: Pt arrived today doing fair with pain, but didn't sleep well. Pt reports being about 10% better overall since starting PT. She was instructed in core activation as well as scapular exs with red tband. She was able to meet LTG #1, but not others due to high pain levels still.    OBJECTIVE IMPAIRMENTS: Abnormal gait, decreased activity tolerance, decreased ROM, increased muscle spasms, postural dysfunction, and pain.   ACTIVITY LIMITATIONS: carrying, lifting, bending, standing, and locomotion level  PARTICIPATION LIMITATIONS: meal prep, cleaning, laundry, and yard work  PERSONAL FACTORS: Time since onset of injury/illness/exacerbation are also affecting patient's functional outcome.   REHAB POTENTIAL: Fair .  CLINICAL DECISION MAKING: Evolving/moderate complexity  EVALUATION COMPLEXITY: Moderate   GOALS:  SHORT TERM GOALS: Target date: 11/03/22.  Ind with an initial HEP Goal status: MET  2.  Sleep 6 hours undisturbed by pain.  Goal status: NM  3.  Perform ADL's with pain  not >4-5/10.  Goal status: NM  PLAN:  PT FREQUENCY: 2x/week  PT DURATION: 4 weeks  PLANNED INTERVENTIONS: Therapeutic exercises, Therapeutic activity, Neuromuscular re-education, Patient/Family education, Self Care, Dry Needling, Electrical stimulation, Cryotherapy, Moist heat, Ultrasound, and Manual therapy.  PLAN FOR NEXT SESSION: Pt on hold until MD f/u.  Darrien Laakso,CHRIS, PTA 11/07/2022, 2:15 PM

## 2022-11-10 ENCOUNTER — Encounter: Payer: Federal, State, Local not specified - PPO | Admitting: *Deleted
